# Patient Record
Sex: Female | Born: 1940 | Race: White | Hispanic: No | Smoking: Never smoker
Health system: Southern US, Academic
[De-identification: ages and names within clinical notes are randomized; demographics above are authoritative.]

## PROBLEM LIST (undated history)

## (undated) DIAGNOSIS — S0990XA Unspecified injury of head, initial encounter: Secondary | ICD-10-CM

## (undated) DIAGNOSIS — Z9181 History of falling: Secondary | ICD-10-CM

## (undated) DIAGNOSIS — E079 Disorder of thyroid, unspecified: Secondary | ICD-10-CM

## (undated) DIAGNOSIS — K219 Gastro-esophageal reflux disease without esophagitis: Secondary | ICD-10-CM

## (undated) DIAGNOSIS — I639 Cerebral infarction, unspecified: Secondary | ICD-10-CM

## (undated) DIAGNOSIS — R519 Headache, unspecified: Secondary | ICD-10-CM

## (undated) DIAGNOSIS — E039 Hypothyroidism, unspecified: Secondary | ICD-10-CM

## (undated) DIAGNOSIS — C801 Malignant (primary) neoplasm, unspecified: Secondary | ICD-10-CM

## (undated) DIAGNOSIS — M6530 Trigger finger, unspecified finger: Secondary | ICD-10-CM

## (undated) DIAGNOSIS — R51 Headache: Secondary | ICD-10-CM

## (undated) DIAGNOSIS — L719 Rosacea, unspecified: Secondary | ICD-10-CM

## (undated) DIAGNOSIS — M1990 Unspecified osteoarthritis, unspecified site: Secondary | ICD-10-CM

## (undated) DIAGNOSIS — G43909 Migraine, unspecified, not intractable, without status migrainosus: Secondary | ICD-10-CM

## (undated) HISTORY — PX: HX HYSTERECTOMY: SHX81

## (undated) HISTORY — PX: COLON SURGERY: SHX602

## (undated) HISTORY — PX: BRAIN SURGERY: SHX531

## (undated) HISTORY — PX: OTHER SURGICAL HISTORY: SHX169

## (undated) HISTORY — PX: APPENDECTOMY: SHX54

## (undated) HISTORY — PX: ABDOMINAL HYSTERECTOMY: SHX81

## (undated) HISTORY — PX: FRACTURE SURGERY: SHX138

---
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
## 1960-06-12 HISTORY — PX: HX TUBAL LIGATION: SHX77

## 1970-06-12 HISTORY — PX: HX APPENDECTOMY: SHX54

## 1998-05-03 ENCOUNTER — Ambulatory Visit (INDEPENDENT_AMBULATORY_CARE_PROVIDER_SITE_OTHER): Payer: Self-pay

## 1998-06-29 ENCOUNTER — Ambulatory Visit (INDEPENDENT_AMBULATORY_CARE_PROVIDER_SITE_OTHER): Payer: Self-pay

## 1998-06-30 ENCOUNTER — Ambulatory Visit (INDEPENDENT_AMBULATORY_CARE_PROVIDER_SITE_OTHER): Payer: Self-pay

## 1998-07-14 ENCOUNTER — Ambulatory Visit (HOSPITAL_BASED_OUTPATIENT_CLINIC_OR_DEPARTMENT_OTHER): Payer: Self-pay

## 1998-08-04 ENCOUNTER — Ambulatory Visit (INDEPENDENT_AMBULATORY_CARE_PROVIDER_SITE_OTHER): Payer: Self-pay

## 1998-08-27 ENCOUNTER — Ambulatory Visit (INDEPENDENT_AMBULATORY_CARE_PROVIDER_SITE_OTHER): Payer: Self-pay

## 1998-09-28 ENCOUNTER — Ambulatory Visit (INDEPENDENT_AMBULATORY_CARE_PROVIDER_SITE_OTHER): Payer: Self-pay

## 1998-11-29 ENCOUNTER — Emergency Department (HOSPITAL_COMMUNITY): Payer: Self-pay | Admitting: EMERGENCY MEDICINE

## 1998-11-29 ENCOUNTER — Ambulatory Visit (INDEPENDENT_AMBULATORY_CARE_PROVIDER_SITE_OTHER): Payer: Self-pay

## 1999-02-23 ENCOUNTER — Ambulatory Visit (INDEPENDENT_AMBULATORY_CARE_PROVIDER_SITE_OTHER): Payer: Self-pay

## 1999-05-11 ENCOUNTER — Ambulatory Visit (INDEPENDENT_AMBULATORY_CARE_PROVIDER_SITE_OTHER): Payer: Self-pay

## 1999-05-17 ENCOUNTER — Ambulatory Visit (INDEPENDENT_AMBULATORY_CARE_PROVIDER_SITE_OTHER): Payer: Self-pay | Admitting: Internal Medicine

## 1999-05-20 ENCOUNTER — Ambulatory Visit (INDEPENDENT_AMBULATORY_CARE_PROVIDER_SITE_OTHER): Payer: Self-pay

## 1999-05-27 ENCOUNTER — Ambulatory Visit (INDEPENDENT_AMBULATORY_CARE_PROVIDER_SITE_OTHER): Payer: Self-pay | Admitting: Internal Medicine

## 1999-07-22 ENCOUNTER — Ambulatory Visit (INDEPENDENT_AMBULATORY_CARE_PROVIDER_SITE_OTHER): Payer: Self-pay | Admitting: Internal Medicine

## 1999-07-25 ENCOUNTER — Ambulatory Visit (INDEPENDENT_AMBULATORY_CARE_PROVIDER_SITE_OTHER): Payer: Self-pay

## 1999-12-27 ENCOUNTER — Ambulatory Visit (INDEPENDENT_AMBULATORY_CARE_PROVIDER_SITE_OTHER): Payer: Self-pay

## 2000-06-12 HISTORY — PX: HX OTHER: 2100001105

## 2001-01-01 ENCOUNTER — Emergency Department (HOSPITAL_COMMUNITY): Payer: Self-pay | Admitting: Emergency Medicine

## 2001-01-01 ENCOUNTER — Other Ambulatory Visit: Payer: Self-pay | Admitting: Emergency Medicine

## 2001-01-23 ENCOUNTER — Ambulatory Visit (HOSPITAL_COMMUNITY): Payer: Self-pay

## 2001-01-24 ENCOUNTER — Ambulatory Visit (INDEPENDENT_AMBULATORY_CARE_PROVIDER_SITE_OTHER): Payer: Self-pay

## 2001-02-06 ENCOUNTER — Ambulatory Visit (INDEPENDENT_AMBULATORY_CARE_PROVIDER_SITE_OTHER): Payer: Self-pay | Admitting: Neurology

## 2001-02-06 ENCOUNTER — Other Ambulatory Visit: Payer: Self-pay | Admitting: Neurology

## 2001-02-06 DIAGNOSIS — R569 Unspecified convulsions: Secondary | ICD-10-CM

## 2001-02-06 HISTORY — DX: Unspecified convulsions (CMS HCC): R56.9

## 2001-02-07 ENCOUNTER — Ambulatory Visit (HOSPITAL_COMMUNITY): Payer: Self-pay

## 2001-02-12 ENCOUNTER — Ambulatory Visit (INDEPENDENT_AMBULATORY_CARE_PROVIDER_SITE_OTHER): Payer: Self-pay

## 2001-02-19 ENCOUNTER — Ambulatory Visit (INDEPENDENT_AMBULATORY_CARE_PROVIDER_SITE_OTHER): Payer: Self-pay | Admitting: Plastic Surgery

## 2001-02-26 ENCOUNTER — Ambulatory Visit (HOSPITAL_COMMUNITY): Payer: Self-pay | Admitting: Neuroradiology

## 2001-03-22 ENCOUNTER — Other Ambulatory Visit: Payer: Self-pay | Admitting: Plastic Surgery

## 2001-03-22 ENCOUNTER — Ambulatory Visit (INDEPENDENT_AMBULATORY_CARE_PROVIDER_SITE_OTHER): Payer: Self-pay | Admitting: Plastic Surgery

## 2001-04-18 ENCOUNTER — Ambulatory Visit (INDEPENDENT_AMBULATORY_CARE_PROVIDER_SITE_OTHER): Payer: Self-pay

## 2001-05-07 ENCOUNTER — Ambulatory Visit (HOSPITAL_COMMUNITY): Payer: Self-pay | Admitting: Plastic Surgery

## 2001-05-13 ENCOUNTER — Ambulatory Visit (HOSPITAL_COMMUNITY): Payer: Self-pay | Admitting: Plastic Surgery

## 2001-05-14 ENCOUNTER — Ambulatory Visit (INDEPENDENT_AMBULATORY_CARE_PROVIDER_SITE_OTHER): Payer: Self-pay | Admitting: Plastic Surgery

## 2001-05-21 ENCOUNTER — Ambulatory Visit (INDEPENDENT_AMBULATORY_CARE_PROVIDER_SITE_OTHER): Payer: Self-pay | Admitting: Plastic Surgery

## 2001-06-12 HISTORY — PX: HX COLONOSCOPY: 2100001147

## 2001-06-25 ENCOUNTER — Ambulatory Visit (INDEPENDENT_AMBULATORY_CARE_PROVIDER_SITE_OTHER): Payer: Self-pay | Admitting: Plastic Surgery

## 2001-08-02 ENCOUNTER — Ambulatory Visit (INDEPENDENT_AMBULATORY_CARE_PROVIDER_SITE_OTHER): Payer: Self-pay | Admitting: Plastic Surgery

## 2001-08-30 ENCOUNTER — Ambulatory Visit (INDEPENDENT_AMBULATORY_CARE_PROVIDER_SITE_OTHER): Payer: Self-pay | Admitting: Plastic Surgery

## 2001-09-20 ENCOUNTER — Ambulatory Visit (HOSPITAL_COMMUNITY): Payer: Self-pay

## 2001-09-27 ENCOUNTER — Ambulatory Visit (INDEPENDENT_AMBULATORY_CARE_PROVIDER_SITE_OTHER): Payer: Self-pay | Admitting: Plastic Surgery

## 2001-11-26 ENCOUNTER — Other Ambulatory Visit: Payer: Self-pay

## 2001-11-26 ENCOUNTER — Ambulatory Visit (HOSPITAL_BASED_OUTPATIENT_CLINIC_OR_DEPARTMENT_OTHER): Payer: Self-pay

## 2002-02-04 ENCOUNTER — Ambulatory Visit (INDEPENDENT_AMBULATORY_CARE_PROVIDER_SITE_OTHER): Payer: Self-pay | Admitting: DERMATOLOGY

## 2002-02-17 ENCOUNTER — Ambulatory Visit (INDEPENDENT_AMBULATORY_CARE_PROVIDER_SITE_OTHER): Payer: Self-pay

## 2002-12-12 ENCOUNTER — Ambulatory Visit (HOSPITAL_BASED_OUTPATIENT_CLINIC_OR_DEPARTMENT_OTHER): Payer: Self-pay

## 2003-08-26 ENCOUNTER — Ambulatory Visit (INDEPENDENT_AMBULATORY_CARE_PROVIDER_SITE_OTHER): Payer: Self-pay | Admitting: DERMATOLOGY

## 2003-09-07 ENCOUNTER — Ambulatory Visit (INDEPENDENT_AMBULATORY_CARE_PROVIDER_SITE_OTHER): Payer: Self-pay

## 2004-01-01 ENCOUNTER — Ambulatory Visit (HOSPITAL_BASED_OUTPATIENT_CLINIC_OR_DEPARTMENT_OTHER): Payer: Self-pay

## 2004-01-06 ENCOUNTER — Ambulatory Visit (HOSPITAL_BASED_OUTPATIENT_CLINIC_OR_DEPARTMENT_OTHER): Payer: Self-pay

## 2004-04-29 ENCOUNTER — Ambulatory Visit (HOSPITAL_BASED_OUTPATIENT_CLINIC_OR_DEPARTMENT_OTHER): Payer: Self-pay

## 2004-05-24 ENCOUNTER — Ambulatory Visit (HOSPITAL_COMMUNITY): Payer: Self-pay | Admitting: EXTERNAL

## 2004-08-03 ENCOUNTER — Ambulatory Visit (INDEPENDENT_AMBULATORY_CARE_PROVIDER_SITE_OTHER): Payer: Self-pay

## 2004-10-25 ENCOUNTER — Ambulatory Visit (HOSPITAL_BASED_OUTPATIENT_CLINIC_OR_DEPARTMENT_OTHER): Payer: Self-pay

## 2005-08-29 ENCOUNTER — Ambulatory Visit (INDEPENDENT_AMBULATORY_CARE_PROVIDER_SITE_OTHER): Payer: Self-pay | Admitting: DERMATOLOGY

## 2005-11-23 ENCOUNTER — Ambulatory Visit (INDEPENDENT_AMBULATORY_CARE_PROVIDER_SITE_OTHER): Payer: Self-pay | Admitting: DERMATOLOGY

## 2006-01-09 ENCOUNTER — Ambulatory Visit (HOSPITAL_BASED_OUTPATIENT_CLINIC_OR_DEPARTMENT_OTHER): Payer: Self-pay | Admitting: Family Medicine

## 2006-02-06 ENCOUNTER — Ambulatory Visit (HOSPITAL_BASED_OUTPATIENT_CLINIC_OR_DEPARTMENT_OTHER): Payer: Self-pay | Admitting: Family Medicine

## 2006-02-20 ENCOUNTER — Ambulatory Visit (HOSPITAL_BASED_OUTPATIENT_CLINIC_OR_DEPARTMENT_OTHER): Payer: Self-pay | Admitting: Family Medicine

## 2006-06-22 ENCOUNTER — Ambulatory Visit (HOSPITAL_BASED_OUTPATIENT_CLINIC_OR_DEPARTMENT_OTHER): Payer: Self-pay | Admitting: Family Medicine

## 2006-07-03 ENCOUNTER — Ambulatory Visit (HOSPITAL_BASED_OUTPATIENT_CLINIC_OR_DEPARTMENT_OTHER): Payer: Self-pay

## 2006-07-30 ENCOUNTER — Ambulatory Visit (HOSPITAL_COMMUNITY): Payer: Self-pay

## 2006-08-13 ENCOUNTER — Encounter (HOSPITAL_COMMUNITY): Payer: No Typology Code available for payment source | Admitting: OBSTETRICS/GYNECOLOGY

## 2006-08-13 ENCOUNTER — Inpatient Hospital Stay
Admission: RE | Admit: 2006-08-13 | Discharge: 2006-08-13 | Disposition: A | Discharge status: Home / Self Care | Payer: No Typology Code available for payment source | Attending: OBSTETRICS/GYNECOLOGY | Admitting: OBSTETRICS/GYNECOLOGY

## 2006-08-30 ENCOUNTER — Ambulatory Visit (INDEPENDENT_AMBULATORY_CARE_PROVIDER_SITE_OTHER): Payer: No Typology Code available for payment source | Admitting: OBSTETRICS/GYNECOLOGY

## 2006-09-06 ENCOUNTER — Ambulatory Visit (INDEPENDENT_AMBULATORY_CARE_PROVIDER_SITE_OTHER): Payer: No Typology Code available for payment source | Admitting: OBSTETRICS/GYNECOLOGY

## 2006-09-27 ENCOUNTER — Other Ambulatory Visit (INDEPENDENT_AMBULATORY_CARE_PROVIDER_SITE_OTHER): Payer: No Typology Code available for payment source

## 2006-10-01 ENCOUNTER — Ambulatory Visit (HOSPITAL_BASED_OUTPATIENT_CLINIC_OR_DEPARTMENT_OTHER): Payer: No Typology Code available for payment source | Admitting: OBSTETRICS/GYNECOLOGY

## 2007-01-15 ENCOUNTER — Ambulatory Visit: Payer: No Typology Code available for payment source | Attending: Family Medicine | Admitting: Family Medicine

## 2007-01-15 DIAGNOSIS — R21 Rash and other nonspecific skin eruption: Secondary | ICD-10-CM | POA: Insufficient documentation

## 2007-01-17 ENCOUNTER — Encounter (INDEPENDENT_AMBULATORY_CARE_PROVIDER_SITE_OTHER): Payer: No Typology Code available for payment source | Admitting: DERMATOLOGY

## 2007-06-13 HISTORY — PX: HX COLPOSCOPY: SHX161

## 2007-09-05 ENCOUNTER — Encounter (INDEPENDENT_AMBULATORY_CARE_PROVIDER_SITE_OTHER): Payer: No Typology Code available for payment source | Admitting: DERMATOLOGY

## 2007-09-12 ENCOUNTER — Other Ambulatory Visit (HOSPITAL_BASED_OUTPATIENT_CLINIC_OR_DEPARTMENT_OTHER): Payer: Self-pay | Admitting: Family Medicine

## 2007-09-12 ENCOUNTER — Ambulatory Visit: Payer: No Typology Code available for payment source | Attending: Family Medicine | Admitting: Family Medicine

## 2007-09-12 DIAGNOSIS — Z01419 Encounter for gynecological examination (general) (routine) without abnormal findings: Secondary | ICD-10-CM | POA: Insufficient documentation

## 2007-09-12 MED ORDER — VENLAFAXINE 37.5 MG TABLET
37.50 mg | ORAL_TABLET | Freq: Two times a day (BID) | ORAL | Status: DC
Start: 2007-09-12 — End: 2008-07-03
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
## 2007-09-18 LAB — HISTORICAL CYTOPATHOLOGY-GYN (PAP AND HPV TESTS)

## 2007-10-15 ENCOUNTER — Encounter (HOSPITAL_BASED_OUTPATIENT_CLINIC_OR_DEPARTMENT_OTHER): Payer: No Typology Code available for payment source | Admitting: Family Medicine

## 2007-12-16 ENCOUNTER — Other Ambulatory Visit (HOSPITAL_BASED_OUTPATIENT_CLINIC_OR_DEPARTMENT_OTHER): Payer: Self-pay | Admitting: Family Medicine

## 2007-12-16 MED ORDER — DOXYCYCLINE MONOHYDRATE 100 MG CAPSULE
100.00 mg | ORAL_CAPSULE | Freq: Two times a day (BID) | ORAL | Status: DC
Start: 2007-12-16 — End: 2008-10-29

## 2007-12-16 NOTE — Telephone Encounter (Signed)
Wants refill on doxycyline sent to [facility]

## 2008-04-01 ENCOUNTER — Ambulatory Visit (INDEPENDENT_AMBULATORY_CARE_PROVIDER_SITE_OTHER): Payer: No Typology Code available for payment source | Admitting: Dermatology

## 2008-04-01 NOTE — Progress Notes (Addendum)
Subjective:       Patient ID: Beverly Blair is a 67 y.o. female     Chief Complaint:   Chief Complaint   Patient presents with   . Skin Check          HPI  Pt with h/o AK's & long h/o tanning bed use here for concerns of new lesion on the cheek.  It has been present for about 6 months & is slowly enlarging and becoming redder.  It is sensitive.  She has not used the tanning bed for 1.5 yrs.   She does not use sunscreen when outdoors.  Denies rash or pruritis & lesions that bleed or won't heal.    Review of Systems   Constitutional: Negative.    Skin: Negative.    HENT: Negative.      Current outpatient prescriptions   Medication Sig   . Doxycycline Monohydrate (MONODOX) 100 mg Cap take 1 Cap by mouth Twice daily.    . Venlafaxine (EFFEXOR) 37.5 mg Tab take 1 Tab by mouth Twice daily.        Objective:   .There were no vitals taken for this visit.    Physical Exam   Constitutional: She appears well-developed and well-nourished. No distress.   HENT:   Head: Normocephalic and atraumatic.       Eyes: Conjunctivae are normal.   Skin:        Nursing note and vitals reviewed.       General skin exam was performed and revealed no areas of concern other than those documented.      Assessment & Plan:       1. 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 SK's (#2, 4):  Benign, follow    3. DF:  Benign, folow (#1, body)    4.Skin tags (#3)  Benign, follow.      Doreatha Massed, MD     See resident's note for details. I saw and evaluated the patient and agree with the resident's findings and plan as written except as noted  and: I was present and supervised/observed the entire cryo procedure.    Beverly Sessions, MD

## 2008-04-01 NOTE — Procedures (Signed)
1 lesion treated, see progress note

## 2008-07-03 ENCOUNTER — Encounter (HOSPITAL_BASED_OUTPATIENT_CLINIC_OR_DEPARTMENT_OTHER): Payer: Self-pay | Admitting: Family Medicine

## 2008-07-03 ENCOUNTER — Ambulatory Visit
Admission: RE | Admit: 2008-07-03 | Discharge: 2008-07-03 | Disposition: A | Discharge status: Home / Self Care | Payer: No Typology Code available for payment source | Attending: Family Medicine | Admitting: Family Medicine

## 2008-07-03 ENCOUNTER — Ambulatory Visit (HOSPITAL_BASED_OUTPATIENT_CLINIC_OR_DEPARTMENT_OTHER): Payer: No Typology Code available for payment source | Admitting: Family Medicine

## 2008-07-03 VITALS — BP 122/70 | HR 72 | Temp 98.0°F | Resp 14 | Wt 157.0 lb

## 2008-07-03 DIAGNOSIS — R21 Rash and other nonspecific skin eruption: Secondary | ICD-10-CM | POA: Insufficient documentation

## 2008-07-03 MED ORDER — CEPHALEXIN 500 MG CAPSULE
500.00 mg | ORAL_CAPSULE | Freq: Two times a day (BID) | ORAL | Status: DC
Start: 2008-07-03 — End: 2008-10-29

## 2008-07-03 NOTE — Progress Notes (Signed)
Note dictated..  Ammie Warrick, MD

## 2008-07-03 NOTE — Progress Notes (Signed)
 [place]  [place]    [facility]      PATIENT NAME: Beverly Blair, Beverly Blair   MEDICAL RECORD NUMBER: [id number]  DATE OF BIRTH: Jun 20, 1940     DATE OF SERVICE: 07/03/2008    SUBJECTIVE: The patient comes in with a concern that she may have gotten a spider bite on her left buttock several days ago. She noticed 2 little red marks which enlarged and became crusted followed by several other pustules that appeared. The crusted areas are very tender to palpation. Of note, about a year ago she had a similar outbreak.    OBJECTIVE: Weight 157, temperature 98 degrees Fahrenheit, blood pressure 122/70, pulse 72, respiratory rate 14. On left buttock, 2 areas that are healed over with surrounding 4 small pustules.    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 We will culture for herpes and treat her with Keflex . If it does come back as positive herpes, then we will know to treat her with Valtrex from now on.      Othel Journey, MD  [facility]    HZ/zgy/[id number]; D: 07/03/2008 10:11:40; T: 07/03/2008 11:00:54

## 2008-07-09 LAB — HERPES SIMPLEX CULTURE

## 2008-07-10 ENCOUNTER — Telehealth (HOSPITAL_BASED_OUTPATIENT_CLINIC_OR_DEPARTMENT_OTHER): Payer: Self-pay

## 2008-07-10 MED ORDER — ACYCLOVIR 400 MG TABLET
400.00 mg | ORAL_TABLET | Freq: Three times a day (TID) | ORAL | Status: DC
Start: 2008-07-10 — End: 2009-02-03

## 2008-07-10 NOTE — Telephone Encounter (Signed)
Informed pt of positive herpes culture will change to acyclovir.

## 2008-10-01 ENCOUNTER — Encounter (INDEPENDENT_AMBULATORY_CARE_PROVIDER_SITE_OTHER): Payer: No Typology Code available for payment source | Admitting: DERMATOLOGY

## 2008-10-06 ENCOUNTER — Encounter (HOSPITAL_BASED_OUTPATIENT_CLINIC_OR_DEPARTMENT_OTHER): Payer: No Typology Code available for payment source | Admitting: Family Medicine

## 2008-10-06 ENCOUNTER — Ambulatory Visit (HOSPITAL_BASED_OUTPATIENT_CLINIC_OR_DEPARTMENT_OTHER): Payer: Self-pay

## 2008-10-07 ENCOUNTER — Ambulatory Visit
Admission: RE | Admit: 2008-10-07 | Discharge: 2008-10-07 | Disposition: A | Discharge status: Home / Self Care | Payer: No Typology Code available for payment source | Attending: Family Medicine | Admitting: Family Medicine

## 2008-10-09 ENCOUNTER — Encounter (HOSPITAL_BASED_OUTPATIENT_CLINIC_OR_DEPARTMENT_OTHER): Payer: No Typology Code available for payment source | Admitting: Family Medicine

## 2008-10-29 ENCOUNTER — Ambulatory Visit (INDEPENDENT_AMBULATORY_CARE_PROVIDER_SITE_OTHER): Payer: No Typology Code available for payment source | Admitting: DERMATOLOGY

## 2008-10-29 ENCOUNTER — Encounter (INDEPENDENT_AMBULATORY_CARE_PROVIDER_SITE_OTHER): Payer: Self-pay | Admitting: DERMATOLOGY

## 2008-10-29 MED ORDER — TRETINOIN 0.025 % TOPICAL CREAM
TOPICAL_CREAM | Freq: Every evening | CUTANEOUS | Status: DC
Start: 2008-10-29 — End: 2009-02-03

## 2008-10-29 NOTE — Progress Notes (Signed)
Subjective:       Patient ID: Beverly Blair is a 68 y.o. female     Chief Complaint:   Chief Complaint   Patient presents with   . Skin Check     6-7 month checkup. AK's, sk's, DF, skin tags.          HPI  AK recurring right cheek.  Tags on neck catching on clothing.  Changing moles right neck.  No hx skin cancer.  No itchy rash.  No fever or chills.    Current outpatient prescriptions   Medication Sig   . acyclovir (ZOVIRAX) 400 mg Tab take 1 Tab by mouth Three times a day.    Marland Kitchen DISCONTD: cephALEXin (KEFLEX) 500 mg Cap take 1 Cap by mouth Twice daily.    Marland Kitchen DISCONTD: Doxycycline Monohydrate (MONODOX) 100 mg Cap take 1 Cap by mouth Twice daily.          Review of Systems   Constitutional: Negative for fever and chills.   Skin: Negative for rash and itching.   Cardiovascular: Negative for chest pain.   Respiratory: Negative for cough.  Is not experiencing shortness of breath.   Gastrointestinal: Negative for vomiting and diarrhea.       Objective:   .BP 122/76   Wt 70.398 kg (155 lb 3.2 oz)    Physical Exam   Constitutional: She is oriented. She appears well-developed and well-nourished. No distress.   HENT:   Head: Normocephalic and atraumatic.   Neurological: She is alert and oriented.   Skin: Skin is warm and dry. She is not diaphoretic.        Psychiatric: She has a normal mood and affect.     General skin exam was performed including head, neck, anterior/posterior trunk, bilateral upper & lower extremities and revealed no areas of concern other than those documented.    Assessment & Plan:     Actinic keratosis right cheek  Skin tags  Irritated seborrheic right neck    Plan;  Freeze thaw freeze AK  Snip tags    Sun screen   Tretinoin    Beverly Sessions, MD

## 2008-11-03 ENCOUNTER — Encounter (HOSPITAL_BASED_OUTPATIENT_CLINIC_OR_DEPARTMENT_OTHER): Payer: No Typology Code available for payment source | Admitting: Family Medicine

## 2008-11-24 ENCOUNTER — Ambulatory Visit (HOSPITAL_BASED_OUTPATIENT_CLINIC_OR_DEPARTMENT_OTHER): Payer: No Typology Code available for payment source | Admitting: Family Medicine

## 2008-11-24 ENCOUNTER — Encounter (HOSPITAL_BASED_OUTPATIENT_CLINIC_OR_DEPARTMENT_OTHER): Payer: Self-pay | Admitting: Family Medicine

## 2008-11-24 ENCOUNTER — Other Ambulatory Visit (HOSPITAL_BASED_OUTPATIENT_CLINIC_OR_DEPARTMENT_OTHER): Payer: Self-pay | Admitting: Family Medicine

## 2008-11-24 ENCOUNTER — Ambulatory Visit
Admission: RE | Admit: 2008-11-24 | Discharge: 2008-11-24 | Disposition: A | Discharge status: Home / Self Care | Payer: No Typology Code available for payment source | Attending: Family Medicine | Admitting: Family Medicine

## 2008-11-24 VITALS — BP 122/72 | Temp 97.1°F | Ht 63.0 in | Wt 153.0 lb

## 2008-11-24 DIAGNOSIS — Z01419 Encounter for gynecological examination (general) (routine) without abnormal findings: Secondary | ICD-10-CM | POA: Insufficient documentation

## 2008-11-24 DIAGNOSIS — Z1151 Encounter for screening for human papillomavirus (HPV): Secondary | ICD-10-CM | POA: Insufficient documentation

## 2008-11-24 DIAGNOSIS — R87612 Low grade squamous intraepithelial lesion on cytologic smear of cervix (LGSIL): Secondary | ICD-10-CM | POA: Insufficient documentation

## 2008-11-24 DIAGNOSIS — Z124 Encounter for screening for malignant neoplasm of cervix: Secondary | ICD-10-CM | POA: Insufficient documentation

## 2008-11-24 MED ORDER — CONJ ESTROGEN-MEDROXYPROGESTERONE 0.625 MG-2.5 MG TABLET
1.0000 | ORAL_TABLET | Freq: Every day | ORAL | Status: DC
Start: 2008-11-24 — End: 2009-03-31

## 2008-11-24 MED ORDER — DOXYCYCLINE MONOHYDRATE 100 MG CAPSULE
100.00 mg | ORAL_CAPSULE | Freq: Two times a day (BID) | ORAL | Status: DC
Start: 2008-11-24 — End: 2009-03-31

## 2008-11-24 NOTE — Progress Notes (Signed)
Note dictated..  Jerrico Covello, MD

## 2008-11-26 LAB — HISTORICAL CYTOPATHOLOGY-GYN (PAP AND HPV TESTS)

## 2008-12-03 ENCOUNTER — Encounter (HOSPITAL_BASED_OUTPATIENT_CLINIC_OR_DEPARTMENT_OTHER): Payer: Self-pay | Admitting: Family Medicine

## 2009-02-03 ENCOUNTER — Ambulatory Visit (INDEPENDENT_AMBULATORY_CARE_PROVIDER_SITE_OTHER): Payer: No Typology Code available for payment source | Admitting: Obstetrics & Gynecology

## 2009-02-03 ENCOUNTER — Encounter (HOSPITAL_BASED_OUTPATIENT_CLINIC_OR_DEPARTMENT_OTHER): Payer: Self-pay | Admitting: Obstetrics & Gynecology

## 2009-02-03 VITALS — BP 116/62 | Ht 64.0 in | Wt 147.0 lb

## 2009-02-03 DIAGNOSIS — R87612 Low grade squamous intraepithelial lesion on cytologic smear of cervix (LGSIL): Secondary | ICD-10-CM

## 2009-02-03 HISTORY — DX: Low grade squamous intraepithelial lesion on cytologic smear of cervix (LGSIL): R87.612

## 2009-02-25 ENCOUNTER — Ambulatory Visit
Admission: RE | Admit: 2009-02-25 | Discharge: 2009-02-25 | Disposition: A | Discharge status: Home / Self Care | Payer: No Typology Code available for payment source | Attending: Family Medicine | Admitting: Family Medicine

## 2009-02-25 DIAGNOSIS — M810 Age-related osteoporosis without current pathological fracture: Secondary | ICD-10-CM | POA: Insufficient documentation

## 2009-02-25 DIAGNOSIS — Z780 Asymptomatic menopausal state: Secondary | ICD-10-CM | POA: Insufficient documentation

## 2009-02-25 DIAGNOSIS — Z13820 Encounter for screening for osteoporosis: Secondary | ICD-10-CM | POA: Insufficient documentation

## 2009-02-26 ENCOUNTER — Encounter (HOSPITAL_BASED_OUTPATIENT_CLINIC_OR_DEPARTMENT_OTHER): Payer: No Typology Code available for payment source | Admitting: Family Medicine

## 2009-02-26 ENCOUNTER — Other Ambulatory Visit (HOSPITAL_BASED_OUTPATIENT_CLINIC_OR_DEPARTMENT_OTHER): Payer: Self-pay

## 2009-03-10 ENCOUNTER — Encounter (HOSPITAL_BASED_OUTPATIENT_CLINIC_OR_DEPARTMENT_OTHER): Payer: Self-pay | Admitting: Obstetrics & Gynecology

## 2009-03-10 ENCOUNTER — Ambulatory Visit
Admission: RE | Admit: 2009-03-10 | Discharge: 2009-03-10 | Disposition: A | Discharge status: Home / Self Care | Payer: No Typology Code available for payment source | Attending: Obstetrics & Gynecology | Admitting: Obstetrics & Gynecology

## 2009-03-10 ENCOUNTER — Ambulatory Visit (INDEPENDENT_AMBULATORY_CARE_PROVIDER_SITE_OTHER): Payer: No Typology Code available for payment source | Admitting: Obstetrics & Gynecology

## 2009-03-10 VITALS — BP 138/90 | Ht 63.5 in | Wt 144.0 lb

## 2009-03-10 NOTE — Progress Notes (Signed)
See procedure notes  Edwyna Perfect, MD 03/10/2009, 2:52 PM

## 2009-03-13 LAB — HISTORICAL SURGICAL PATHOLOGY SPECIMEN

## 2009-03-14 ENCOUNTER — Encounter (HOSPITAL_BASED_OUTPATIENT_CLINIC_OR_DEPARTMENT_OTHER): Payer: Self-pay | Admitting: Obstetrics & Gynecology

## 2009-03-31 ENCOUNTER — Ambulatory Visit (INDEPENDENT_AMBULATORY_CARE_PROVIDER_SITE_OTHER): Payer: No Typology Code available for payment source | Admitting: Obstetrics & Gynecology

## 2009-03-31 ENCOUNTER — Encounter (HOSPITAL_BASED_OUTPATIENT_CLINIC_OR_DEPARTMENT_OTHER): Payer: Self-pay | Admitting: Obstetrics & Gynecology

## 2009-03-31 VITALS — BP 132/76 | Ht 63.5 in | Wt 145.0 lb

## 2009-03-31 MED ORDER — CONJ ESTROGEN-MEDROXYPROGESTERONE 0.625 MG-2.5 MG TABLET
1.0000 | ORAL_TABLET | Freq: Every day | ORAL | Status: DC
Start: 2009-03-31 — End: 2009-11-29

## 2009-03-31 MED ORDER — DOXYCYCLINE MONOHYDRATE 100 MG CAPSULE
100.00 mg | ORAL_CAPSULE | Freq: Two times a day (BID) | ORAL | Status: DC
Start: 2009-03-31 — End: 2009-11-09

## 2009-03-31 NOTE — Progress Notes (Signed)
Patient is here for follow up after colposcopy    68 y.o. female who presented for colposcopy on 03-10-09  Due to a pap smear consistent with LGSIL    Pathology results reveal biopsy results consistent with LGSIL, but completely stenotic cervix and no ECC able to be performed and also inadequate due to inability to visualize the TZ zone.    Patient had no complaints after the procedure.    ROS: The patient denies any bowel or bladder symptoms, all other systems were negative to review.    Physical Examination:    Filed Vitals:    03/31/2009  1:24 PM   BP: 132/76   Height: 1.613 m (5' 3.5")   Weight: 65.772 kg (145 lb)         General: female in no acute distress  HEENT: NCAT, EOMI  Neurologic: Alert and Oriented  Psychological: Mood stable    Assessment/Plan:    1. Follow up after Colposcopy / Mild Dysplasia, but inadequate colposcopy  The results were discussed with the patient. The patients options were discussed in great detail and a decision for a CKC was agreed upon due to inability to rule out further disease. Risks, benefits, and alternatives were discussed.    15/15 min was spent on counseling the above issues    Edwyna Perfect, MD

## 2009-04-01 ENCOUNTER — Ambulatory Visit (FREE_STANDING_LABORATORY_FACILITY): Payer: Self-pay | Admitting: Obstetrics & Gynecology

## 2009-04-01 NOTE — Telephone Encounter (Addendum)
Pt would like to speak with Dr Cecelia Byars. Pt states if there is a possibility that you may have to do a hysterectomy if there are cancerous cells present, she is wondering if she should go ahead with partial hyst and remove the uterus since she is going to be in there anyway. Please advise.  Cell: [phone]    Called pt per Dr Cecelia Byars, pt will not be able to do hysterectomy until bx is done.

## 2009-04-26 ENCOUNTER — Encounter (HOSPITAL_COMMUNITY): Payer: Self-pay

## 2009-04-26 ENCOUNTER — Encounter (HOSPITAL_BASED_OUTPATIENT_CLINIC_OR_DEPARTMENT_OTHER): Payer: No Typology Code available for payment source | Admitting: Obstetrics & Gynecology

## 2009-04-29 ENCOUNTER — Encounter (INDEPENDENT_AMBULATORY_CARE_PROVIDER_SITE_OTHER): Payer: No Typology Code available for payment source | Admitting: DERMATOLOGY

## 2009-04-30 ENCOUNTER — Ambulatory Visit
Admission: RE | Admit: 2009-04-30 | Discharge: 2009-04-30 | Disposition: A | Discharge status: Home / Self Care | Payer: No Typology Code available for payment source | Attending: Obstetrics & Gynecology | Admitting: Obstetrics & Gynecology

## 2009-04-30 ENCOUNTER — Encounter (HOSPITAL_BASED_OUTPATIENT_CLINIC_OR_DEPARTMENT_OTHER): Payer: Self-pay | Admitting: Obstetrics & Gynecology

## 2009-04-30 ENCOUNTER — Ambulatory Visit (INDEPENDENT_AMBULATORY_CARE_PROVIDER_SITE_OTHER): Payer: No Typology Code available for payment source | Admitting: Obstetrics & Gynecology

## 2009-04-30 ENCOUNTER — Encounter (HOSPITAL_COMMUNITY): Payer: Self-pay

## 2009-04-30 DIAGNOSIS — Z01818 Encounter for other preprocedural examination: Secondary | ICD-10-CM | POA: Insufficient documentation

## 2009-04-30 HISTORY — DX: Unspecified osteoarthritis, unspecified site: M19.90

## 2009-04-30 HISTORY — DX: Rosacea, unspecified: L71.9

## 2009-04-30 LAB — URINALYSIS, MACROSCOPIC AND MICROSCOPIC
BILIRUBIN: NEGATIVE
BLOOD: NEGATIVE
GLUCOSE: NEGATIVE mg/dL
HYALINE CAST: 1 /LPF (ref 0–3)
KETONES: NEGATIVE mg/dL
LEUKOCYTES: NEGATIVE
NITRITE: POSITIVE — AB
PH URINE: 5 (ref 5.0–8.0)
PROTEIN: NEGATIVE mg/dL
RBC'S: NONE SEEN /HPF (ref 0–4)
SPECIFIC GRAVITY, URINE: 1.016 (ref 1.005–1.030)
UROBILINOGEN: NORMAL mg/dL
WBC'S: 1 /HPF (ref 0–6)

## 2009-04-30 LAB — ELECTROLYTES
ANION GAP: 6 mmol/L (ref 5–16)
CARBON DIOXIDE: 27 mmol/L (ref 22–32)
CHLORIDE: 106 mmol/L (ref 96–111)
POTASSIUM: 4.2 mmol/L (ref 3.5–5.1)
SODIUM: 139 mmol/L (ref 136–145)

## 2009-04-30 LAB — BUN
BUN/CREAT RATIO: 21 (ref 6–22)
BUN: 14 mg/dL (ref 6–20)

## 2009-04-30 LAB — CBC/DIFF
BASOPHILS: 1 % (ref 0–1)
BASOS ABS: 0.052 THOU/uL (ref 0.0–0.2)
EOS ABS: 0.062 THOU/uL — ABNORMAL LOW (ref 0.1–0.3)
EOSINOPHIL: 1 % (ref 1–6)
HCT: 37.8 % (ref 33.5–45.2)
HGB: 13.3 g/dL (ref 11.5–15.2)
LYMPHOCYTES: 39 % (ref 20–45)
LYMPHS ABS: 2.7 THOU/uL (ref 1.0–4.8)
MCH: 33.2 pg — ABNORMAL HIGH (ref 27.4–33.0)
MCHC: 35.1 g/dL (ref 31.6–35.5)
MCV: 94.5 fL (ref 82.0–99.0)
MONOCYTES: 7 % (ref 4–13)
MONOS ABS: 0.452 THOU/uL (ref 0.1–0.9)
MPV: 8.2 FL (ref 7.4–10.4)
NRBC'S: 0 /100{WBCs}
PLATELET COUNT: 234 THO/UL (ref 140–450)
PMN ABS: 3.59 THOU/uL (ref 1.5–7.7)
PMN'S: 52 % (ref 40–75)
RBC: 4 MIL/uL (ref 3.84–5.04)
RDW: 11.1 % (ref 10.2–14.0)
WBC: 6.9 THOU/UL (ref 3.5–11.0)

## 2009-04-30 LAB — CREATININE WITH EGFR: ESTIMATED GLOMERULAR FILTRATION RATE: >59 ml/min/1.73m2 (ref >59)

## 2009-04-30 LAB — TYPE AND SCREEN
ABO/RH(D): A POS
ANTIBODY SCREEN: NEGATIVE

## 2009-04-30 LAB — CREATININE: CREATININE: 0.68 mg/dL (ref 0.49–1.10)

## 2009-04-30 NOTE — Progress Notes (Signed)
This is a preop visit      This is a  68 y.o. 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  Patient Active Problem List   Diagnoses Date Noted   . 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 Rosacea [695.3] 11/24/2008   . Menopausal and postmenopausal disorder [627.9C] 11/24/2008   . GERD [530.81] 08/13/2006   . Headaches [784.0] 08/13/2006   . Seizures [780.39] 02/06/2001           ROS:  The patient denies and bowel or bladder symptoms. All other symptoms negative to review.      Past Surgical History   Procedure Date   . Hx tubal ligation    . Hx colposcopy 2009   . Hx tonsillectomy    . Hx appendectomy          History   Substance Use Topics   . Tobacco Use: Never   . Alcohol Use: Yes       Family History   Problem Relation   . Cancer Father               Physical Examination:  Filed Vitals:    04/30/2009  8:36 AM   BP: 110/60   Height: 1.613 m (5' 3.5")   Weight: 64.411 kg (142 lb)         General: no acute distress  Skin: intact, without lesions  HEENT: NCAT, EOMI  Lungs: Clear to auscultation  Cardiac: Regular rate and rhythm without murmur, rub, or gallop  Neurologic: alert and oriented, no focal deficits noted  Psychiatric: mood stable      Assessment/Plan:    1. Preoperative Visit  The patient was consented. All risks, benefits, and alternatives were discussed. The patient wishes to proceed. She will present to PAT for lab work. NPO post midnight discussed the night before surgery, no ASA, NSAIDS, or herbal drugs between now and her surgery. Postop course discussed and a postop appt will be made today.   Shmuel Girgis Cecelia Byars, MD                                  GYN PRE OP INFO    Procedure date: 05-11-09  Attending: Myrick Mcnairy  Admit to :[facility]  Resident: Gyn  Cervical dysplasia  Procedure: CKC  No known drug allergies   Medications to take on AM of surgery- am meds

## 2009-05-02 LAB — URINE CULTURE

## 2009-05-03 ENCOUNTER — Other Ambulatory Visit (HOSPITAL_BASED_OUTPATIENT_CLINIC_OR_DEPARTMENT_OTHER): Payer: Self-pay | Admitting: Obstetrics & Gynecology

## 2009-05-03 ENCOUNTER — Telehealth (HOSPITAL_BASED_OUTPATIENT_CLINIC_OR_DEPARTMENT_OTHER): Payer: Self-pay | Admitting: Obstetrics & Gynecology

## 2009-05-03 ENCOUNTER — Ambulatory Visit (HOSPITAL_BASED_OUTPATIENT_CLINIC_OR_DEPARTMENT_OTHER): Payer: Self-pay | Admitting: Family Medicine

## 2009-05-03 MED ORDER — SULFAMETHOXAZOLE 800 MG-TRIMETHOPRIM 160 MG TABLET
1.00 | ORAL_TABLET | Freq: Two times a day (BID) | ORAL | Status: DC
Start: 2009-05-03 — End: 2009-05-28

## 2009-05-03 NOTE — Telephone Encounter (Signed)
Call with ekg results

## 2009-05-03 NOTE — Telephone Encounter (Addendum)
Result Notes message copied by Altariq Goodall, Larae Grooms on Mon May 03, 2009  1:40 PM  ------   Message from: Edwyna Perfect        Created: Mon May 03, 2009  1:06 PM    Call patient with uti and needs to get called in bactrim ds one tab po bid for 7 days and a repeat urine culture prior to her surgery      Phoned in rx as above per Dr Cecelia Byars to [facility]

## 2009-05-04 ENCOUNTER — Encounter (HOSPITAL_BASED_OUTPATIENT_CLINIC_OR_DEPARTMENT_OTHER): Payer: Self-pay | Admitting: Family Medicine

## 2009-05-04 ENCOUNTER — Ambulatory Visit (HOSPITAL_BASED_OUTPATIENT_CLINIC_OR_DEPARTMENT_OTHER): Payer: No Typology Code available for payment source | Admitting: Family Medicine

## 2009-05-04 ENCOUNTER — Ambulatory Visit
Admission: RE | Admit: 2009-05-04 | Discharge: 2009-05-04 | Disposition: A | Discharge status: Home / Self Care | Payer: No Typology Code available for payment source | Attending: Family Medicine | Admitting: Family Medicine

## 2009-05-04 DIAGNOSIS — Z01818 Encounter for other preprocedural examination: Secondary | ICD-10-CM | POA: Insufficient documentation

## 2009-05-04 NOTE — Progress Notes (Signed)
Note dictated..  Keeli Roberg, MD

## 2009-05-09 ENCOUNTER — Ambulatory Visit
Admission: RE | Admit: 2009-05-09 | Discharge: 2009-05-09 | Disposition: A | Discharge status: Home / Self Care | Payer: No Typology Code available for payment source | Attending: Obstetrics & Gynecology | Admitting: Obstetrics & Gynecology

## 2009-05-09 ENCOUNTER — Other Ambulatory Visit (HOSPITAL_BASED_OUTPATIENT_CLINIC_OR_DEPARTMENT_OTHER): Payer: Self-pay | Admitting: Obstetrics & Gynecology

## 2009-05-10 ENCOUNTER — Encounter (HOSPITAL_BASED_OUTPATIENT_CLINIC_OR_DEPARTMENT_OTHER): Payer: No Typology Code available for payment source | Admitting: Obstetrics & Gynecology

## 2009-05-10 NOTE — OR PreOp (Cosign Needed)
Pre-Op Note  Patient Name: Beverly Blair  DOB: [birth year]  GNF:[id number]      Date and Time: May 11, 2009; Start Time: 0715; End Time: 0850    Pre-Op Diagnosis: Cervical dysplasia and inadequate colposcopy    Procedure: Biopsy Cervix Cone    Surgeon: Dr. Edwyna Perfect    Labs:    Results for orders placed during the hospital encounter of 05/09/2009 (from the past 480 hours)   URINE CULTURE WITH GRAM STAIN    Collection Time 05/09/09  1:14 PM   Component Value Range   . SPECIMEN DESCRIPTION URINE     . SPECIAL REQUESTS NONE     . GRAM STAIN NO CELLS OR ORGANISMS SEEN     . CULTURE OBSERVATION NO GROWTH 1 DAY     . REPORT STATUS PENDING     Results for orders placed during the hospital encounter of 04/30/2009 (from the past 480 hours)   BUN    Collection Time 04/30/09 10:12 AM   Component Value Range   . BUN 14  6 - 20 (mg/dL)   . BUN/CREAT RATIO 21  6 - 22    CBC/DIFF    Collection Time 04/30/09 10:12 AM   Component Value Range   . WBC 6.9  3.5 - 11.0 (THOU/UL)   . RBC 4.00  3.84 - 5.04 (MIL/uL)   . HGB 13.3  11.5 - 15.2 (g/dL)   . HCT 37.8  33.5 - 45.2 (%)   . MCV 94.5  82.0 - 99.0 (fL)   . MCH 33.2 (*) 27.4 - 33.0 (pg)   . MCHC 35.1  31.6 - 35.5 (g/dL)   . RDW 11.1  10.2 - 14.0 (%)   . PLATELET COUNT 234  140 - 450 (THO/UL)   . MPV 8.2  7.4 - 10.4 (FL)   . PMN'S 52  40 - 75 (%)   . PMN ABS 3.590  1.5 - 7.7 (THOU/uL)   . LYMPHOCYTES 39  20 - 45 (%)   . LYMPHS ABS 2.700  1.0 - 4.8 (THOU/uL)   . MONOCYTES 7  4 - 13 (%)   . MONOS ABS 0.452  0.1 - 0.9 (THOU/uL)   . EOSINOPHIL 1  1 - 6 (%)   . EOS ABS 0.062 (*) 0.1 - 0.3 (THOU/uL)   . BASOPHILS 1  0 - 1 (%)   . BASOS ABS 0.052  0.0 - 0.2 (THOU/uL)   . NRBC'S 0  0 (/100WBC)   CREATININE    Collection Time 04/30/09 10:12 AM   Component Value Range   . CREATININE 0.68  0.49 - 1.10 (mg/dL)   . ESTIMATED GLOMERULAR FILTRATION RATE >59  >59 (ml/min/1.40m2)   ELECTROLYTES    Collection Time 04/30/09 10:12 AM   Component Value Range   . SODIUM 139  136 - 145 (mmol/L)   .  POTASSIUM 4.2  3.5 - 5.1 (mmol/L)   . CHLORIDE 106  96 - 111 (mmol/L)   . CARBON DIOXIDE 27  22 - 32 (mmol/L)   . ANION GAP 6  5 - 16 (mmol/L)   URINALYSIS (ROUTINE)    Collection Time 04/30/09 10:12 AM   Component Value Range   . CHARACTER CLEAR     . COLOR YELLOW     . SPECIFIC GRAVITY, URINE 1.016  1.005 - 1.030    . GLUCOSE NEGATIVE  NEGATIVE (mg/dL)   . BILIRUBIN NEGATIVE  NEGATIVE    . KETONES NEGATIVE  NEGATIVE (mg/dL)   .  BLOOD NEGATIVE  NEGATIVE    . PH URINE 5.0  5.0 - 8.0    . PROTEIN NEGATIVE  NEGATIVE (mg/dL)   . UROBILINOGEN NORMAL  0.2 (mg/dL)   . NITRITE POSITIVE (*) NEGATIVE    . LEUKOCYTES NEGATIVE  NEGATIVE    . RBC'S    0 - 4 (/HPF)    Value: NONE SEEN- MICROSCOPIC ANALYSIS PERFORMED BY AUTOMATED METHOD   . WBC'S 1  0 - 6 (/HPF)   . BACTERIA FEW (*) OCL    . Squamous Epithelial RARE  (/LPF)   . Mucous LIGHT  (/LPF)   . Hyaline Cast 1  0 - 3 (/LPF)   URINE CULTURE (OUTPT)    Collection Time 04/30/09 10:12 AM   Component Value Range   . SPECIMEN DESCRIPTION CLEAN CATCH URINE     . SPECIAL REQUESTS NONE     . GRAM STAIN NOT REPORTED     . CULTURE OBSERVATION   (*)     Value: GREATER THAN 100,000 COLONIES/ML      KLEBSIELLA PNEUMONIAE   . REPORT STATUS        Value: 05/02/2009      FINAL   TYPE AND SCREEN    Collection Time 04/30/09 10:12 AM   Component Value Range   . UNITS ORDERED NOT STATED         . ABO/RH(D) A POSITIVE     . ANTIBODY SCREEN NEGATIVE     . SPECIMEN EXPIRATION DATE 05/03/2009       Chest X-ray: NONE FOUND    EKG::  Ventricular Rate 65 BPM  Atrial Rate 65 BPM  P-R Interval 158 ms  QRS Duration 90 ms  QT 400 ms  QTc 416 ms  P Axis 46 degrees  R Axis -30 degrees  T Axis 31 degrees  Sinus rhythm  Left axis deviation  Confirmed by Raphael Gibney MD, ABNASH ([id number]), editor VENICK, SHAYNA ([id number]) on 05/04/2009 6:55:24 PM     Blood: A POSITIVE; ANTIBODY SCREEN NEGATIVE    Anesthesia: General    Consent: Signed 04/30/09    Orders:  1. Insert and Maintain Peripheral IV Access  2. Diet NPO  3. Vital Signs:  Per [facility]SC Routine  4. Height and Weight  5. Place Sequential Compression Device - Place in OR  6. Surgical Site Prep  7. Type and Screen  8. LR 1000 mL premix infusion    Macario Carls, MED STUDENT 05/10/2009, 9:12 PM

## 2009-05-11 ENCOUNTER — Encounter (HOSPITAL_COMMUNITY)
Admission: RE | Disposition: A | Discharge status: Home / Self Care | Payer: Self-pay | Source: Ambulatory Visit | Attending: Obstetrics & Gynecology

## 2009-05-11 ENCOUNTER — Encounter (HOSPITAL_BASED_OUTPATIENT_CLINIC_OR_DEPARTMENT_OTHER): Payer: No Typology Code available for payment source | Admitting: Obstetrics & Gynecology

## 2009-05-11 ENCOUNTER — Other Ambulatory Visit (HOSPITAL_BASED_OUTPATIENT_CLINIC_OR_DEPARTMENT_OTHER): Payer: Self-pay | Admitting: Obstetrics & Gynecology

## 2009-05-11 ENCOUNTER — Other Ambulatory Visit (HOSPITAL_COMMUNITY): Payer: Self-pay | Admitting: Obstetrics & Gynecology

## 2009-05-11 ENCOUNTER — Inpatient Hospital Stay
Admission: RE | Admit: 2009-05-11 | Discharge: 2009-05-11 | Disposition: A | Discharge status: Home / Self Care | Payer: No Typology Code available for payment source | Attending: Obstetrics & Gynecology | Admitting: Obstetrics & Gynecology

## 2009-05-11 DIAGNOSIS — N879 Dysplasia of cervix uteri, unspecified: Secondary | ICD-10-CM | POA: Insufficient documentation

## 2009-05-11 DIAGNOSIS — N882 Stricture and stenosis of cervix uteri: Secondary | ICD-10-CM | POA: Insufficient documentation

## 2009-05-11 HISTORY — PX: HCHG CONE BIOPSY: 700072

## 2009-05-11 LAB — TYPE AND SCREEN
ABO/RH(D): A POS
ANTIBODY SCREEN: NEGATIVE

## 2009-05-11 LAB — URINE CULTURE WITH GRAM STAIN
CULTURE OBSERVATION: NO GROWTH
GRAM STAIN: NONE SEEN

## 2009-05-11 SURGERY — BIOPSY CERVIX CONE
Anesthesia: General

## 2009-05-11 MED ORDER — MIDAZOLAM 1 MG/ML INJECTION SOLUTION
INTRAMUSCULAR | Status: AC
Start: 2009-05-11 — End: 2009-05-11
  Filled 2009-05-11: qty 2

## 2009-05-11 MED ORDER — ROCURONIUM 10 MG/ML INTRAVENOUS SOLUTION
INTRAVENOUS | Status: AC
Start: 2009-05-11 — End: 2009-05-11
  Filled 2009-05-11: qty 5

## 2009-05-11 MED ORDER — SODIUM CHLORIDE 0.9 % IRRIGATION SOLUTION
1000.00 mL | Status: DC | PRN
Start: 2009-05-11 — End: 2009-05-11
  Administered 2009-05-11: 1000 mL

## 2009-05-11 MED ORDER — ONDANSETRON HCL (PF) 4 MG/2 ML INJECTION SOLUTION
4.00 mg | Freq: Once | INTRAMUSCULAR | Status: DC | PRN
Start: 2009-05-11 — End: 2009-05-11

## 2009-05-11 MED ORDER — IODINE STRONG (LUGOLS) 5 % ORAL SOLN
5.00 mL | Freq: Once | ORAL | Status: DC | PRN
Start: 2009-05-11 — End: 2009-05-11
  Administered 2009-05-11: 5 mL via TOPICAL
  Filled 2009-05-11: qty 20

## 2009-05-11 MED ORDER — EPHEDRINE SULFATE 50 MG/ML INJECTION SOLUTION
INTRAMUSCULAR | Status: AC
Start: 2009-05-11 — End: 2009-05-11
  Filled 2009-05-11: qty 1

## 2009-05-11 MED ORDER — IBUPROFEN 600 MG TABLET
600.00 mg | ORAL_TABLET | Freq: Once | ORAL | Status: DC | PRN
Start: 2009-05-11 — End: 2009-05-11

## 2009-05-11 MED ORDER — LACTATED RINGERS INTRAVENOUS SOLUTION
INTRAVENOUS | Status: DC
Start: 2009-05-11 — End: 2009-05-11
  Administered 2009-05-11: 0 via INTRAVENOUS

## 2009-05-11 MED ORDER — GLYCOPYRROLATE 0.2 MG/ML INJECTION SOLUTION
INTRAMUSCULAR | Status: AC
Start: 2009-05-11 — End: 2009-05-11
  Filled 2009-05-11: qty 1

## 2009-05-11 MED ORDER — FENTANYL (PF) 50 MCG/ML INJECTION SOLUTION
INTRAMUSCULAR | Status: AC
Start: 2009-05-11 — End: 2009-05-11
  Filled 2009-05-11: qty 2

## 2009-05-11 MED ORDER — ATROPINE 0.4 MG/ML INJECTION SOLUTION
INTRAMUSCULAR | Status: AC
Start: 2009-05-11 — End: 2009-05-11
  Filled 2009-05-11: qty 1

## 2009-05-11 MED ORDER — SURGICAL FIBRILLAR ABS HEMOSTAT OXIDIZED CELLULOSE 2X4 PAD
1.00 | MEDICATED_PAD | Freq: Once | TOPICAL | Status: AC
Start: 2009-05-11 — End: 2009-05-11
  Administered 2009-05-11: 1 via TOPICAL

## 2009-05-11 MED ORDER — ONDANSETRON HCL (PF) 4 MG/2 ML INJECTION SOLUTION
INTRAMUSCULAR | Status: AC
Start: 2009-05-11 — End: 2009-05-11
  Filled 2009-05-11: qty 2

## 2009-05-11 MED ORDER — IBUPROFEN 600 MG TABLET
600.00 mg | ORAL_TABLET | Freq: Four times a day (QID) | ORAL | Status: DC | PRN
Start: 2009-05-11 — End: 2009-05-28

## 2009-05-11 MED ORDER — WATER FOR INJECTION, STERILE INJECTION SOLUTION
INTRAMUSCULAR | Status: AC
Start: 2009-05-11 — End: 2009-05-11
  Filled 2009-05-11: qty 10

## 2009-05-11 MED ORDER — LIDOCAINE (PF) 100 MG/5 ML (2 %) INTRAVENOUS SYRINGE
INJECTION | INTRAVENOUS | Status: AC
Start: 2009-05-11 — End: 2009-05-11
  Filled 2009-05-11: qty 5

## 2009-05-11 MED ORDER — OXYCODONE-ACETAMINOPHEN 5 MG-325 MG TABLET
1.00 | ORAL_TABLET | ORAL | Status: DC | PRN
Start: 2009-05-11 — End: 2009-05-11

## 2009-05-11 MED ORDER — DEXAMETHASONE SODIUM PHOSPHATE 10 MG/ML INJECTION SOLUTION
INTRAMUSCULAR | Status: AC
Start: 2009-05-11 — End: 2009-05-11
  Filled 2009-05-11: qty 1

## 2009-05-11 MED ORDER — OXYCODONE-ACETAMINOPHEN 5 MG-325 MG TABLET
1.00 | ORAL_TABLET | ORAL | Status: DC | PRN
Start: 2009-05-11 — End: 2009-05-28

## 2009-05-11 MED ORDER — FENTANYL (PF) 50 MCG/ML INJECTION SOLUTION
25.00 ug | INTRAMUSCULAR | Status: DC | PRN
Start: 2009-05-11 — End: 2009-05-11
  Filled 2009-05-11: qty 0.5

## 2009-05-11 MED ORDER — LIDOCAINE 1 %-EPINEPHRINE 1:100,000 INJECTION SOLUTION
Freq: Once | INTRAMUSCULAR | Status: DC | PRN
Start: 2009-05-11 — End: 2009-05-11
  Administered 2009-05-11: 10 mL via INTRAMUSCULAR

## 2009-05-11 SURGICAL SUPPLY — 25 items
BLADE BEAVER SURG STRL LF CER V BIOPSY (SURGICAL CUTTING SUPPLIES) ×1 IMPLANT
CATH URETH DOVER RBNL 14FR 16IN Ã¡STGR DRAIN EYE RND CLS TIP INT FNL CONN PVC STRL LF  DISP (UROLOGICAL SUPPLIES) ×1 IMPLANT
CONV USE ITEM 156524 - ADHESIVE TISSUE EXOFIN 1.0ML_PREMIERPRO EXOFIN (SEALANTS) IMPLANT
COUNTER 40 CNT MAG DVN BOXLOCKS STRL LF  BLK SHARP 1840 PLASTIC FOAM XL DISP (NEEDLES & SYRINGE SUPPLIES) ×1 IMPLANT
DISCONTINUED USE ITEM 91401 - SUTURE 0 UR-6 VICRYL 27IN VIOL BRD COAT ABS (SUTURE/WOUND CLOSURE) IMPLANT
DONUT EXTREMITY CUSHIONING 31143137 (POSITIONING PRODUCTS) ×1 IMPLANT
DUPE USE ITEM 319452 - SUTURE 0 CT2 VICRYL 27IN VIOL_BRD COAT ABS (SUTURE/WOUND CLOSURE) IMPLANT
ELECTRODE ESURG BALL 5MM STRL SS DISP ELMNT CX CNTMN STD SHAFT (Electrical Supplies) ×1 IMPLANT
ELECTRODE ESURG BLADE PNCL 10FT VLAB STRL SS DISP BUTTON SWH HEX LOCK CORD HLSTR LF  ACPT 3/32IN STD (CAUTERY SUPPLIES) ×1 IMPLANT
EXCISOR BIOPSY FSCHR MED CONE DIST TIP CURVE STP ARM INSL SHAFT LNR RIGID WRE (SURGICAL INSTRUMENTS) IMPLANT
HANDLE RIGID PLASTIC STRL LF  DISP DVN EZ HNDL SURG LIGHT (INSTRUMENTS) ×1
HANDPC SUCT MEDIVAC YANKAUER BLBS TIP CLR STRL LF  DISP (Suction) ×1 IMPLANT
HEMOSTAT ABS 8X4IN FLXB SHR WV_SRGCL STRL DISP (WOUND CARE SUPPLY) ×1 IMPLANT
KIT RM TURNOVER CLEANOP CSTM INFCT CONTROL (KITS & TRAYS (DISPOSABLE)) ×1
KIT RM TURNOVER CLEANOP CUSTOM INFCT CONTROL (KITS & TRAYS (DISPOSABLE)) ×1 IMPLANT
MBO USE ITEM 317672 - HANDLE RIGID PLASTIC STRL LF  DISP DVN EZ HNDL SURG LIGHT (SURGICAL INSTRUMENTS) ×1 IMPLANT
NEEDLE SPINAL BLK 3.5IN 22GA QUINCKE REG WL POLYPROP QUINCKE TIP STRL LF  DISP (ANETHESIA SUPPLIES) ×1 IMPLANT
PACK GYN/PERI 9165 9165 (DRAPE/PACKS/SHEETS/OR TOWEL) ×1 IMPLANT
PAD ARMBRD BLU (POSITIONING PRODUCTS) ×1 IMPLANT
PAD RELEASE 3 X 4 IN_1050 50/BX (WOUND CARE SUPPLY) ×1 IMPLANT
SLEEVE SCD EXPRESS KNEE REG 5 PER CASE 9529 (EQUIPMENT MINOR) ×1 IMPLANT
SUTURE 0 UR-6 VICRYL 27IN VIOL BRD COAT ABS (SUTURE/WOUND CLOSURE)
SYRINGE LL 10ML LF  STRL CONTROL CONCEN TIP PRGN FREE DEHP-FR MED DISP (NEEDLES & SYRINGE SUPPLIES) ×1 IMPLANT
TRAY SKIN SCRUB 8IN VNYL COTTON 6 WNG 6 SPONGE STICK 2 TIP APPL DRY STRL LF (KITS & TRAYS (DISPOSABLE)) ×1 IMPLANT
TUBING SUCT CLR 20FT 9/32IN MEDIVAC NCDTV M/M CONN STRL LF (Suction) ×1 IMPLANT

## 2009-05-11 NOTE — Progress Notes (Signed)
ANESTHESIA POSTOP EVALUATION NOTE  Anesthesia Service  [facility]      05/11/2009     Patient Name:                    Beverly Blair   Medical record Number:   [id number]   Date of Birth:                     1940/10/12     Temperature: 36.6 C (97.9 F) ( Simultaneous filing. User may not have seen previous data.) (05/11/09 10:11 AM)  Heart Rate: 89  ( Simultaneous filing. User may not have seen previous data.) (05/11/09 10:11 AM)  BP (Non-Invasive): 149/70 mmHg ( Simultaneous filing. User may not have seen previous data.) (05/11/09 10:11 AM)  Respiratory Rate: 18  ( Simultaneous filing. User may not have seen previous data.) (05/11/09 10:11 AM)  SpO2-1: 95 % ( Simultaneous filing. User may not have seen previous data.) (05/11/09 10:11 AM)  Weight: 64.5 kg (142 lb 3.2 oz) (05/11/09  6:32 AM)    Patient is sufficiently recovered from the effects of anesthesia to participate in the evaluation and has returned to their pre-procedure level.    I have reviewed and evaluated the following:  Respiratory Function:  Consistent with pre anesthetic level  Cardiovascular Function:  Consistent with pre anesthetic level  Mental Status:  Return to pre anesthetic baseline level  Pain:  Sufficiently controlled with medication  Nausea and Vomiting:  Absent or sufficiently controlled with medication    Post operative complications: None    Comment/ re-evaluation for any variations:  None     Alleen Borne, MD 05/11/2009, 10:38 AM     Faculty Note:  I examined the patient and agree with the above details of postoperative assessment.

## 2009-05-11 NOTE — OR PreOp (Signed)
Pt to 5N preop area, preop routine discussed.  Pt with no family present.  Ride home is Darel Hong at [phone]

## 2009-05-11 NOTE — Progress Notes (Addendum)
[facility]    Beverly Blair  [id number]  05/11/2009    Discharge Note    Discharge patient home when meets criteria.  Follow up with Dr. Cecelia Byars in 2 weeks.  Home with motrin and percocet for pain control.    Honey Mahmoudi, MD 05/11/2009, 8:33 AM  Edwyna Perfect, MD 05/11/2009, 1:57 PM

## 2009-05-11 NOTE — Nurses Notes (Signed)
Pt discharged to lobby via wheelchair by Lenore PCA.

## 2009-05-11 NOTE — Discharge Instructions (Signed)
SURGICAL DISCHARGE INSTRUCTIONS     Dr. Leim Fabry Hashmi  performed your BIOPSY CERVIX CONE today at the [facility]    [facility]:  Monday through Friday from 6 a.m. - 7 p.m.: [phone]  Between 7 p.m. - 6 a.m., weekends and holidays:  Call [facility] at [phone] or [phone].        SIGNS AND SYMPTOMS OF A WOUND / INCISION INFECTION   Be sure to watch for the following:   Increase in redness or red streaks near or around the wound or incision.  Increase in pain that is intense or severe and cannot be relieved by the pain medication that your doctor has given you.  Increase in swelling that cannot be relieved by elevation of a body part, or by applying ice, if permitted.  Increase in drainage, or if yellow / green in color and smells bad. This could be on a dressing or a cast.  Increase in fever for longer than 24 hours, or an increase that is higher than 101 degrees Fahrenheit (normal body temperature is 98 degrees Fahrenheit). The incision may feel warm to the touch.    **CALL YOUR DOCTOR IF ONE OR MORE OF THESE SIGNS / SYMPTOMS SHOULD OCCUR.    ANESTHESIA INFORMATION   ANESTHESIA -- ADULT PATIENTS:  You have received intravenous sedation / general anesthesia, and you may feel drowsy and light-headed for several hours. You may even experience some forgetfulness of the procedure. DO NOT DRIVE A MOTOR VEHICLE or perform any activity requiring complete alertness or coordination until you feel fully awake in about 24-48 hours. Do not drink alcoholic beverages for at least 24 hours. Do not stay alone, you must have a responsible adult available to be with you. You may also experience a dry mouth or nausea for 24 hours. This is a normal side effect and will disappear as the effects of the medication wear off.    REMEMBER   If you experience any difficulty breathing, chest pain, bleeding that you feel is excessive, persistent nausea or vomiting or for any other concerns:  Call  your physician Dr. Cecelia Byars at [phone] or [phone]. You may also ask to have the GYN doctor on call paged. They are available to you 24 hours a day.    SPECIAL INSTRUCTIONS / COMMENTS   Signs and symptoms of infection    FOLLOW-UP APPOINTMENTS   Please call patient services at [phone] or [phone] to schedule a date / time of return. They are open Monday - Friday from 7:30 am - 5:00 pm.

## 2009-05-11 NOTE — Nurses Notes (Signed)
Pt states that she is having 4/10 abd cramping with some back pain, pt states that when she voided here is phase II that the pain did diminish some. Pt states that she does not want any pain medication. I instructed that if pt should want any pain medication that PO medications are available for her to have. Pt states she will take Ibuprofen at home, and does not want medications here. I will cont to monitor pt and will cont to assess pt pain.

## 2009-05-11 NOTE — OR PostOp (Signed)
Pt tolerating ice chips. Denies pain or n/v

## 2009-05-11 NOTE — H&P (Addendum)
[facility]                                                     H&P UPDATE FORM    Beverly Blair, Beverly Blair, 68 y.o. female  Date of Admission:  05/09/2009  Date of Birth:  1940-10-29    05/11/2009    STOP: IF H&P IS GREATER THAN 30 DAYS FROM SURGICAL DAY COMPLETE NEW H&P IS REQUIRED.    Outpatient Pre-Surgical H & P updated the day of the procedure.  1.  H&P assessment remains unchanged based on completion of re-assessment.      Change in medications: No      Last Menstrual Period: Postmenopause    2.  Patient continues to be appropiate candidate for planned surgical procedure. YES  Beverly Lobosco Cecelia Byars, MD 05/11/2009, 7:20 AM        Shelba Flake, MD 6:43 AM

## 2009-05-11 NOTE — Progress Notes (Signed)
Discharge Note  Patient to home per anesthesia when meets criteria.  Patient has followup appt for 2 weeks.  Pelvic rest and no baths for 3 weeks.  Edwyna Perfect, MD

## 2009-05-11 NOTE — OR PostOp (Signed)
Pt to pacu history and allergies reviewed by green at bedside. Pt awake, denies pain at this time. Will continue to monitor.

## 2009-05-11 NOTE — Nurses Notes (Signed)
Discharge instructions given to family friend and pt. No questions asked. Will cont to monitor and assess pt.

## 2009-05-11 NOTE — Progress Notes (Signed)
Patient seen at 0707  Questions answered and procedure reviewed.  Postop followup and instructions given.  Edwyna Perfect, MD 05/11/2009, 7:16 AM

## 2009-05-12 LAB — HISTORICAL SURGICAL PATHOLOGY SPECIMEN

## 2009-05-12 NOTE — OR Surgeon (Signed)
[facility]                                  DEPARTMENT OF OBSTETRICS AND GYNECOLOGY                                     OPERATION SUMMARY    PATIENT NAME: Beverly Blair, Beverly Blair [facility] NUMBER:[id number]  DATE OF SERVICE:05/11/2009  DATE OF BIRTH: [birth year]    PREOPERATIVE DIAGNOSES:  1. Cervical dysplasia.  2. Inadequate colposcopy.  3. Complete cervical stenosis.    POSTOPERATIVE DIAGNOSES:  1. Cervical dysplasia.  2. Inadequate colposcopy.  3. Complete cervical stenosis.    NAME OF PROCEDURE:  Cold knife cone biopsy with post-cone endocervical curettage.    SURGEONS:  Edwyna Perfect MD (staff), Shelba Flake MD (assistant).    ANESTHESIA:  General with LMA.    ESTIMATED BLOOD LOSS:  Minimal.    COMPLICATIONS:  Completely stenotic and flush cervix with the vagina.    INTRAVENOUS FLUIDS:  One liter.    DEEP VENOUS THROMBOSIS PROPHYLAXIS:  Venodynes.     DESCRIPTION OF PROCEDURE:  The patient was taken to the operating room.  After adequate general anesthesia was induced with LMA, the patient was prepped and draped in usual sterile fashion.  Venodynes were placed prior to induction of anesthesia.  The bladder was drained for clear sterile urine.  A fine-toothed tenaculum was placed on the anterior lip of the cervix.  The cervix was almost completely flush with the vagina and difficult to assess.  Digital exam confirmed the cervix with no cervical os notable due to complete stenosis.  The anterior lip of the cervix was identified via palpation and a fine-tooth tenaculum placed on the anterior lip of the cervix.  One-percent lidocaine with epinephrine using 10 mL was injected circumferentially around the cervix for hemostasis and in the midline to help identify the cervical os.  Hemostatic stay sutures were placed at 3 and 9 o'clock using 0 Vicryl suture and tied down.  Lugol solution was placed on the cervix to identify any glycogen-depleted areas, which were none.  The pediatric uterine sound was utilized to probe the endocervical/cervical canal that was unable to be visualized and a small area in the midline was able to be penetrated.  Serial dilatation was undertaken to confirm the canal.  A Beaver blade was subsequently utilized starting at 6 o'clock and ending at 6 o'clock to encompass as much of the cervical tissue that could be identified safely.  The cone base was removed with Mayo scissors.  A post-cone ECC was performed using Kevorkian curette without difficulty.  There was some old blood that noted to come from the uterine cavity.  The ball cautery was utilized to cauterize the base of the cone and Surgicel was placed within the base and sutures tied over it.  The tenaculum was removed from the cervix.  The patient went to the recovery room in stable condition.  All sponge, needle and instrument counts were noted to be correct at the completion of the  procedure.      Edwyna Perfect, MD  Associate Professor  [facility] Department of Obstetrics and Gynecology    [id number]; D: 05/11/2009 13:57:25; T: 05/11/2009 21:55:13

## 2009-05-13 ENCOUNTER — Ambulatory Visit (HOSPITAL_BASED_OUTPATIENT_CLINIC_OR_DEPARTMENT_OTHER): Payer: Self-pay | Admitting: Obstetrics & Gynecology

## 2009-05-13 NOTE — Telephone Encounter (Addendum)
Called Beverly Blair per Dr Cecelia Byars to see how she is doing post op.    No ans, lmom for return call 9:16am      9:35am Beverly Blair left message on voicemail that she is doing just fine and thanks for the call to check on her.

## 2009-05-24 ENCOUNTER — Ambulatory Visit (HOSPITAL_BASED_OUTPATIENT_CLINIC_OR_DEPARTMENT_OTHER): Payer: No Typology Code available for payment source | Admitting: Obstetrics & Gynecology

## 2009-05-28 ENCOUNTER — Ambulatory Visit (INDEPENDENT_AMBULATORY_CARE_PROVIDER_SITE_OTHER): Payer: No Typology Code available for payment source | Admitting: Obstetrics & Gynecology

## 2009-05-28 ENCOUNTER — Encounter (HOSPITAL_BASED_OUTPATIENT_CLINIC_OR_DEPARTMENT_OTHER): Payer: Self-pay | Admitting: Obstetrics & Gynecology

## 2009-05-28 VITALS — BP 126/76 | Wt 145.0 lb

## 2009-05-28 NOTE — Progress Notes (Signed)
Patient he is here for a postoperative visit     This is a 68 y.o. white female who presents s/p cold knife cone on due to cervical dysplasia history.  Patient Active Problem List   Diagnoses Date Noted   . 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 Rosacea [695.3] 11/24/2008   . Menopausal and postmenopausal disorder [627.9C] 11/24/2008   . GERD [530.81] 08/13/2006   . Headaches [784.0] 08/13/2006   . Seizures [780.39] 02/06/2001             The patient's pathology returned with negative pathology    The patient has been doing well postoperatively without complaint. She has no pain, significant discharge or bleeding, fevers, or pelvic tenderness.      Review of systems:  The patient denies any bowel or bladder symptoms, all other systems were negative to review.    Filed Vitals:    05/28/2009 10:33 AM   BP: 126/76   Weight: 65.772 kg (145 lb)       Physical Examination:    General: no acute distress  Skin: intact, without lesions  HEENT: NCAT, EOMI  Abdomen: soft, NT, ND, no masses  Neurologic: alert and oriented, no focal deficits noted  Psychiatric: mood stable    Gynecologic:    External Genitalia: without lesions  Urethra: no lesions  Vulva: no lesions  Vagina: no lesions  Cervix:: no lesions or cervical motion tenderness, cervix is granulating in well with no abnormal discharge    Assessment/plan  1. Postop cold knife cone biopsy  The patient's pathology was discussed. Her exam was negative. She will followup in 6 months for repeat Pap. Repeat Pap intervals were discussed with the patient for 2 years at every 6 month intervals. Recurrent dysplasia discussed.  Edwyna Perfect, MD 05/28/2009, 5:11 PM

## 2009-07-01 ENCOUNTER — Encounter (INDEPENDENT_AMBULATORY_CARE_PROVIDER_SITE_OTHER): Payer: No Typology Code available for payment source | Admitting: DERMATOLOGY

## 2009-07-19 ENCOUNTER — Ambulatory Visit (HOSPITAL_BASED_OUTPATIENT_CLINIC_OR_DEPARTMENT_OTHER): Payer: Self-pay | Admitting: Obstetrics & Gynecology

## 2009-07-19 ENCOUNTER — Other Ambulatory Visit (HOSPITAL_BASED_OUTPATIENT_CLINIC_OR_DEPARTMENT_OTHER): Payer: Self-pay | Admitting: Obstetrics & Gynecology

## 2009-07-19 NOTE — Telephone Encounter (Addendum)
Pt calling today stating that she had surg 05/11/09, she states she had some bleeding post op but, then it all subsided for a few days then restarted with pink to red spotting which has not subsided. Pt is afebrile and has no c/o abd pain, nausea, vomiting or any other s/s. Pt states the spotting is present when she wipes after urination and she has a scant spotting on pantyliner daily. Please advise.       Usn to evaluate endometrial lining thickness and then followup with me      Pt notified will transfer to front desk to schedule u/s then appt with Hashmi.

## 2009-07-20 ENCOUNTER — Ambulatory Visit
Admission: RE | Admit: 2009-07-20 | Discharge: 2009-07-20 | Disposition: A | Discharge status: Home / Self Care | Payer: No Typology Code available for payment source | Attending: Obstetrics & Gynecology | Admitting: Obstetrics & Gynecology

## 2009-07-20 DIAGNOSIS — N950 Postmenopausal bleeding: Secondary | ICD-10-CM | POA: Insufficient documentation

## 2009-07-26 ENCOUNTER — Ambulatory Visit (INDEPENDENT_AMBULATORY_CARE_PROVIDER_SITE_OTHER): Payer: No Typology Code available for payment source | Admitting: Obstetrics & Gynecology

## 2009-07-26 ENCOUNTER — Encounter (HOSPITAL_BASED_OUTPATIENT_CLINIC_OR_DEPARTMENT_OTHER): Payer: Self-pay | Admitting: Obstetrics & Gynecology

## 2009-07-26 VITALS — BP 124/68 | Ht 64.5 in | Wt 144.5 lb

## 2009-07-26 DIAGNOSIS — N950 Postmenopausal bleeding: Secondary | ICD-10-CM

## 2009-07-26 HISTORY — DX: Postmenopausal bleeding: N95.0

## 2009-08-02 ENCOUNTER — Encounter (HOSPITAL_BASED_OUTPATIENT_CLINIC_OR_DEPARTMENT_OTHER): Payer: Self-pay | Admitting: Obstetrics & Gynecology

## 2009-08-02 ENCOUNTER — Ambulatory Visit (INDEPENDENT_AMBULATORY_CARE_PROVIDER_SITE_OTHER): Payer: No Typology Code available for payment source | Admitting: Obstetrics & Gynecology

## 2009-08-02 ENCOUNTER — Ambulatory Visit
Admission: RE | Admit: 2009-08-02 | Discharge: 2009-08-02 | Disposition: A | Discharge status: Home / Self Care | Payer: No Typology Code available for payment source | Attending: Obstetrics & Gynecology | Admitting: Obstetrics & Gynecology

## 2009-08-02 ENCOUNTER — Encounter (HOSPITAL_COMMUNITY): Payer: Self-pay

## 2009-08-02 LAB — URINALYSIS, MACROSCOPIC AND MICROSCOPIC
BILIRUBIN: NEGATIVE
BLOOD: NEGATIVE
GLUCOSE: NEGATIVE mg/dL
HYALINE CAST: 1 /LPF (ref 0–3)
KETONES: NEGATIVE mg/dL
LEUKOCYTES: NEGATIVE
NITRITE: NEGATIVE
PROTEIN: NEGATIVE mg/dL
RBC'S: <1 /HPF (ref 0–4)
UROBILINOGEN: NORMAL mg/dL
WBC'S: NONE SEEN /HPF (ref 0–6)

## 2009-08-02 LAB — CBC/DIFF
BASOPHILS: 1 % (ref 0–1)
BASOS ABS: 0.031 THOU/uL (ref 0.0–0.2)
EOS ABS: 0.027 THOU/uL — ABNORMAL LOW (ref 0.1–0.3)
EOSINOPHIL: 0 % — ABNORMAL LOW (ref 1–6)
HCT: 39.9 % (ref 33.5–45.2)
HGB: 13.7 g/dL (ref 11.5–15.2)
LYMPHOCYTES: 36 % (ref 20–45)
LYMPHS ABS: 1.97 THOU/uL (ref 1.0–4.8)
MCH: 32.1 pg (ref 27.4–33.0)
MCHC: 34.4 g/dL (ref 31.6–35.5)
MCV: 93.4 fL (ref 82.0–99.0)
MONOCYTES: 8 % (ref 4–13)
MONOS ABS: 0.426 THOU/uL (ref 0.1–0.9)
MPV: 8.3 FL (ref 7.4–10.4)
NRBC'S: 0 /100{WBCs}
PLATELET COUNT: 230 THO/UL (ref 140–450)
PMN ABS: 3.05 THOU/uL (ref 1.5–7.7)
PMN'S: 55 % (ref 40–75)
RBC: 4.27 MIL/uL (ref 3.84–5.04)
RDW: 11.4 % (ref 10.2–14.0)
WBC: 5.5 THOU/UL (ref 3.5–11.0)

## 2009-08-02 LAB — PERFORM POC WHOLE BLOOD GLUCOSE: GLUCOSE, POINT OF CARE: 100 mg/dL (ref 70–105)

## 2009-08-02 LAB — TYPE AND SCREEN
ABO/RH(D): A POS
ANTIBODY SCREEN: NEGATIVE

## 2009-08-02 LAB — ELECTROLYTES
CARBON DIOXIDE: 25 mmol/L (ref 22–32)
CHLORIDE: 102 mmol/L (ref 96–111)
SODIUM: 140 mmol/L (ref 136–145)

## 2009-08-02 LAB — BUN
BUN/CREAT RATIO: 18 (ref 6–22)
BUN: 13 mg/dL (ref 6–20)

## 2009-08-02 LAB — CREATININE
CREATININE: 0.72 mg/dL (ref 0.49–1.10)
ESTIMATED GLOMERULAR FILTRATION RATE: >59 mL/min/{1.73_m2} (ref >59)
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
## 2009-08-03 ENCOUNTER — Telehealth (HOSPITAL_BASED_OUTPATIENT_CLINIC_OR_DEPARTMENT_OTHER): Payer: Self-pay | Admitting: Obstetrics & Gynecology

## 2009-08-03 ENCOUNTER — Telehealth (HOSPITAL_BASED_OUTPATIENT_CLINIC_OR_DEPARTMENT_OTHER): Payer: Self-pay | Admitting: Family Medicine

## 2009-08-03 NOTE — Telephone Encounter (Signed)
Patient had an appt for preop on Monday.  At that time she had a scratchy throat.  It is now really sore and she feels like she has sinus pressure and a headache.  Is there anything she can take OTC to help clear it up before surgery?  Thanks.

## 2009-08-03 NOTE — Telephone Encounter (Signed)
Staff Message copied by Theotis Barrio on Tue Aug 03, 2009 3:11 PM  ------   Message from: Edwyna Perfect   Created: Fri Jul 30, 2009 4:16 PM    Lavina Hamman  I called Lupita Leash in scheduling today to get this patient on to follow my case on Friday 2-25 and she said it is now on for 10:30 for a d and c , h/s and possible polyp removal with the morcellator for pmb-can you confirm that everything is done and that we are good to go--thx    CJ  Call her to come in on Monday for her preop with me and put her in [place] Bazelak's spot who will not be there on Monday  thx

## 2009-08-04 LAB — URINE CULTURE

## 2009-08-04 NOTE — Telephone Encounter (Signed)
Spoke with patient.  She will pick up script today.  Called in script per Hashmi.

## 2009-08-04 NOTE — Student (Addendum)
08/04/2009  Margrett Kalb  [id number]    Pre-Op Note    Date:  08/06/09  Start Time:  0910  End Time:  1050    Surgeon:  Dr. Cecelia Byars    Pre-Op Diagnosis:    Post menopausal bleeding    Procedure:    Hysteroscopy with dilation and curettage  Hysteroscopy with morcellator  Polypectomy    Labs:    Results for orders placed during the hospital encounter of 08/02/2009 (from the past 336 hours)   BUN    Collection Time 08/02/09 12:31 PM   Component Value Range   . BUN 13  6 - 20 (mg/dL)   . BUN/CREAT RATIO 18  6 - 22    CBC/DIFF    Collection Time 08/02/09 12:31 PM   Component Value Range   . WBC 5.5  3.5 - 11.0 (THOU/UL)   . RBC 4.27  3.84 - 5.04 (MIL/uL)   . HGB 13.7  11.5 - 15.2 (g/dL)   . HCT 39.9  33.5 - 45.2 (%)   . MCV 93.4  82.0 - 99.0 (fL)   . MCH 32.1  27.4 - 33.0 (pg)   . MCHC 34.4  31.6 - 35.5 (g/dL)   . RDW 11.4  10.2 - 14.0 (%)   . PLATELET COUNT 230  140 - 450 (THO/UL)   . MPV 8.3  7.4 - 10.4 (FL)   . PMN'S 55  40 - 75 (%)   . PMN ABS 3.050  1.5 - 7.7 (THOU/uL)   . LYMPHOCYTES 36  20 - 45 (%)   . LYMPHS ABS 1.970  1.0 - 4.8 (THOU/uL)   . MONOCYTES 8  4 - 13 (%)   . MONOS ABS 0.426  0.1 - 0.9 (THOU/uL)   . EOSINOPHIL 0 (*) 1 - 6 (%)   . EOS ABS 0.027 (*) 0.1 - 0.3 (THOU/uL)   . BASOPHILS 1  0 - 1 (%)   . BASOS ABS 0.031  0.0 - 0.2 (THOU/uL)   . NRBC'S 0  0 (/100WBC)   CREATININE    Collection Time 08/02/09 12:31 PM   Component Value Range   . CREATININE 0.72  0.49 - 1.10 (mg/dL)   . ESTIMATED GLOMERULAR FILTRATION RATE >59  >59 (ml/min/1.58m2)   ELECTROLYTES    Collection Time 08/02/09 12:31 PM   Component Value Range   . SODIUM 140  136 - 145 (mmol/L)   . POTASSIUM 4.0  3.5 - 5.1 (mmol/L)   . CHLORIDE 102  96 - 111 (mmol/L)   . CARBON DIOXIDE 25  22 - 32 (mmol/L)   . ANION GAP 13  5 - 16 (mmol/L)   URINALYSIS (ROUTINE)    Collection Time 08/02/09 12:31 PM   Component Value Range   . CHARACTER CLEAR     . COLOR YELLOW     . SPECIFIC GRAVITY, URINE 1.010  1.005 - 1.030    . GLUCOSE NEGATIVE  NEGATIVE (mg/dL)      . BILIRUBIN NEGATIVE  NEGATIVE    . KETONES NEGATIVE  NEGATIVE (mg/dL)   . BLOOD NEGATIVE  NEGATIVE    . PH URINE 5.0  5.0 - 8.0    . PROTEIN NEGATIVE  NEGATIVE (mg/dL)   . UROBILINOGEN NORMAL  0.2 (mg/dL)   . NITRITE NEGATIVE  NEGATIVE    . LEUKOCYTES NEGATIVE  NEGATIVE    . RBC'S    0 - 4 (/HPF)    Value: <1  MICROSCOPIC ANALYSIS PERFORMED BY  AUTOMATED METHOD   . WBC'S NONE SEEN  0 - 6 (/HPF)   . BACTERIA OCCASIONAL OR LESS  OCL    . Squamous Epithelial RARE  (/LPF)   . Mucous LIGHT  (/LPF)   . Hyaline Cast 1  0 - 3 (/LPF)   URINE CULTURE (OUTPT)    Collection Time 08/02/09 12:31 PM   Component Value Range   . SPECIMEN DESCRIPTION CLEAN CATCH URINE     . SPECIAL REQUESTS NONE     . GRAM STAIN NOT REPORTED     . CULTURE OBSERVATION        Value: Low number of organisms present, suggestive of contamination.  If further workup is needed, Best boy.  Plates will be held 3 days.   Marland Kitchen REPORT STATUS        Value: 08/04/2009      FINAL   TYPE AND SCREEN    Collection Time 08/02/09 12:31 PM   Component Value Range   . UNITS ORDERED NOT STATED         . ABO/RH(D) A POSITIVE     . ANTIBODY SCREEN NEGATIVE     . SPECIMEN EXPIRATION DATE 08/05/2009     Results for orders placed during the hospital encounter of 08/06/2009 (from the past 336 hours)   POCT WHOLE BLOOD GLUCOSE    Collection Time 08/02/09 12:30 PM   Component Value Range   . GLUCOSE, POINT OF CARE 100  70 - 105 (mg/dL)     Chest X-Ray:    Not on file    EKG:    Sinus rhythm  Left axis deviation  (performed 05/04/09)    Blood:      ABO/RH(D)  A POSITIVE    ANTIBODY SCREEN   NEGATIVE     Anesthesia:    General    Consent:    Obtained 08/02/09    Orders:    Insert and maintain peripheral IV access   Diet NPO - Now   Vital Signs: Per RDSC routine   Height and weight   Surgical site to be marked by surgical team with marking pen / initials or "yes" or alternative site marking completed if unable to sign site   Place sequential compression devices in OR  LR 1000  mL premix infusion  VOID ON ADMISSION AND ON CALL TO OR-ONE TIME    Cori Razor, MED STUDENT 08/04/2009, 11:36 AM  Edwyna Perfect, MD 08/04/2009, 3:04 PM

## 2009-08-06 ENCOUNTER — Encounter (HOSPITAL_COMMUNITY): Payer: Self-pay

## 2009-08-06 ENCOUNTER — Encounter (HOSPITAL_BASED_OUTPATIENT_CLINIC_OR_DEPARTMENT_OTHER): Payer: No Typology Code available for payment source | Admitting: Obstetrics & Gynecology

## 2009-08-06 ENCOUNTER — Other Ambulatory Visit (HOSPITAL_COMMUNITY): Payer: Self-pay | Admitting: Obstetrics & Gynecology

## 2009-08-06 ENCOUNTER — Encounter (HOSPITAL_COMMUNITY)
Admission: RE | Disposition: A | Discharge status: Home / Self Care | Payer: Self-pay | Source: Ambulatory Visit | Attending: Obstetrics & Gynecology

## 2009-08-06 ENCOUNTER — Inpatient Hospital Stay
Admission: RE | Admit: 2009-08-06 | Discharge: 2009-08-06 | Disposition: A | Discharge status: Home / Self Care | Payer: No Typology Code available for payment source | Attending: Obstetrics & Gynecology | Admitting: Obstetrics & Gynecology

## 2009-08-06 DIAGNOSIS — N840 Polyp of corpus uteri: Secondary | ICD-10-CM | POA: Insufficient documentation

## 2009-08-06 HISTORY — PX: HX DILATION AND CURETTAGE: SHX78

## 2009-08-06 LAB — TYPE AND SCREEN
ABO/RH(D): A POS
ANTIBODY SCREEN: NEGATIVE

## 2009-08-06 SURGERY — HYSTEROSCOPY WITH DILATION AND CURETTAGE
Anesthesia: General | Wound class: Clean Contaminated Wounds-The respiratory, GI, Genital, or urinary

## 2009-08-06 MED ORDER — HYDROMORPHONE (PF) 2 MG/ML INJECTION SYRINGE
0.20 mg | INJECTION | INTRAMUSCULAR | Status: DC | PRN
Start: 2009-08-06 — End: 2009-08-07
  Administered 2009-08-06: 0.4 mg via INTRAVENOUS

## 2009-08-06 MED ORDER — SODIUM CHLORIDE 0.9 % INJECTION SOLUTION
INTRAMUSCULAR | Status: AC
Start: 2009-08-06 — End: 2009-08-06
  Filled 2009-08-06: qty 10

## 2009-08-06 MED ORDER — DROPERIDOL 2.5 MG/ML INJECTION SOLUTION
INTRAMUSCULAR | Status: AC
Start: 2009-08-06 — End: 2009-08-06
  Filled 2009-08-06: qty 2

## 2009-08-06 MED ORDER — ONDANSETRON HCL (PF) 4 MG/2 ML INJECTION SOLUTION
4.00 mg | Freq: Once | INTRAMUSCULAR | Status: DC | PRN
Start: 2009-08-06 — End: 2009-08-07

## 2009-08-06 MED ORDER — LIDOCAINE (PF) 100 MG/5 ML (2 %) INTRAVENOUS SYRINGE
INJECTION | INTRAVENOUS | Status: AC
Start: 2009-08-06 — End: 2009-08-06
  Filled 2009-08-06: qty 10

## 2009-08-06 MED ORDER — OXYCODONE-ACETAMINOPHEN 5 MG-325 MG TABLET
1.00 | ORAL_TABLET | ORAL | Status: DC | PRN
Start: 2009-08-06 — End: 2009-08-16

## 2009-08-06 MED ORDER — FENTANYL (PF) 50 MCG/ML INJECTION SOLUTION
INTRAMUSCULAR | Status: AC
Start: 2009-08-06 — End: 2009-08-06
  Filled 2009-08-06: qty 4

## 2009-08-06 MED ORDER — SODIUM CHLORIDE 0.9 % IRRIGATION SOLUTION
3000.00 mL | Status: DC | PRN
Start: 2009-08-06 — End: 2009-08-06
  Administered 2009-08-06: 3000 mL
  Filled 2009-08-06: qty 3000

## 2009-08-06 MED ORDER — OXYCODONE-ACETAMINOPHEN 5 MG-325 MG TABLET
1.00 | ORAL_TABLET | ORAL | Status: DC | PRN
Start: 2009-08-06 — End: 2009-08-07

## 2009-08-06 MED ORDER — HYDROMORPHONE (PF) 2 MG/ML INJECTION SYRINGE
INJECTION | INTRAMUSCULAR | Status: AC
Start: 2009-08-06 — End: 2009-08-06
  Filled 2009-08-06: qty 1

## 2009-08-06 MED ORDER — DEXAMETHASONE SODIUM PHOSPHATE 10 MG/ML INJECTION SOLUTION
INTRAMUSCULAR | Status: AC
Start: 2009-08-06 — End: 2009-08-06
  Filled 2009-08-06: qty 2

## 2009-08-06 MED ORDER — CEFAZOLIN 1 GRAM INTRAVENOUS SOLUTION
INTRAVENOUS | Status: AC
Start: 2009-08-06 — End: 2009-08-06
  Filled 2009-08-06: qty 10

## 2009-08-06 MED ORDER — EPHEDRINE SULFATE 50 MG/ML INJECTION SOLUTION
INTRAMUSCULAR | Status: AC
Start: 2009-08-06 — End: 2009-08-06
  Filled 2009-08-06: qty 2

## 2009-08-06 MED ORDER — LACTATED RINGERS INTRAVENOUS SOLUTION
INTRAVENOUS | Status: DC
Start: 2009-08-06 — End: 2009-08-07
  Administered 2009-08-06: 0 via INTRAVENOUS

## 2009-08-06 MED ORDER — IBUPROFEN 600 MG TABLET
600.00 mg | ORAL_TABLET | Freq: Four times a day (QID) | ORAL | Status: DC | PRN
Start: 2009-08-06 — End: 2009-08-16

## 2009-08-06 MED ORDER — ONDANSETRON HCL (PF) 4 MG/2 ML INJECTION SOLUTION
INTRAMUSCULAR | Status: AC
Start: 2009-08-06 — End: 2009-08-06
  Filled 2009-08-06: qty 4

## 2009-08-06 MED ORDER — MIDAZOLAM 1 MG/ML INJECTION SOLUTION
INTRAMUSCULAR | Status: AC
Start: 2009-08-06 — End: 2009-08-06
  Filled 2009-08-06: qty 2

## 2009-08-06 SURGICAL SUPPLY — 35 items
ADAPTER CATH 9FR SLFSL FEMALE LL CKFL STRL DISP (UROLOGICAL SUPPLIES) IMPLANT
ADAPTER CATH CKFL SLFSL STD FE_MALE LL 9FR INSTR STRL DISP (UROLOGICAL SUPPLIES)
APPL 70% ISPRP 2% CHG 26ML CHLRPRP HI-LT ORNG PREP STRL LF  DISP CLR (WOUND CARE SUPPLY) ×1 IMPLANT
BLANKET 3M BAIR HUG ADLT UPR B ODY 74X24IN PLMR 2 INCS ADH (MISCELLANEOUS PT CARE ITEMS) ×1 IMPLANT
CAN SUCT 3L HIFLO (Suction) ×3 IMPLANT
CATH URETH DOVER RBNL 14FR 16IN Ã¡STGR DRAIN EYE RND CLS TIP INT FNL CONN PVC STRL LF  DISP (UROLOGICAL SUPPLIES) ×1 IMPLANT
CONV USE ITEM 156524 - ADHESIVE TISSUE EXOFIN 1.0ML_PREMIERPRO EXOFIN (SEALANTS) IMPLANT
CONV USE ITEM 322842 - PAD FLOOR SUCTION ABSORBENT_83630 (Suction) IMPLANT
DISCONTINUED NO SUB - JELLY LUB DYNALUBE BCTRST WATER SOL NGRS PKT STRL 5GM LF (WOUND CARE SUPPLY) ×2 IMPLANT
DONUT EXTREMITY CUSHIONING 31143137 (POSITIONING PRODUCTS) ×1 IMPLANT
DRAPE FL CNTRL PCH DRAIN PORT FILTER SCRN 38X15IN UNDR BUTT CNVRT LF  STRL DISP SURG 27IN (PROTECTIVE PRODUCTS/GARMENTS) ×1 IMPLANT
DRAPE POUCH IRRIG 19X23IN 1016_10EA/BX (PROTECTIVE PRODUCTS/GARMENTS) ×1 IMPLANT
HANDLE RIGID PLASTIC STRL LF  DISP DVN EZ HNDL SURG LIGHT (INSTRUMENTS) ×1
KIT HYSTEROSCOPIC PROCEDURE 7209827 (Other Miscellaneous) ×1 IMPLANT
KIT RM TURNOVER CLEANOP CSTM INFCT CONTROL (KITS & TRAYS (DISPOSABLE)) ×1
KIT RM TURNOVER CLEANOP CUSTOM INFCT CONTROL (KITS & TRAYS (DISPOSABLE)) ×1 IMPLANT
MBO USE ITEM 317672 - HANDLE RIGID PLASTIC STRL LF  DISP DVN EZ HNDL SURG LIGHT (SURGICAL INSTRUMENTS) ×1 IMPLANT
MRCLTR SURG TRUCLEAR ROT HYSCP DISP (SURGICAL CUTTING SUPPLIES) ×1 IMPLANT
MRCLTR SURG TRUCLEAR ROT HYSCP_DISP (CUTTING ELEMENTS) ×1
MRCLTR SURG TRUCLEAR ULTRA + RECIPROCATE HYSCP 4MM (SURGICAL CUTTING SUPPLIES) IMPLANT
NEEDLE SPINAL BLK 3.5IN 22GA QUINCKE REG WL POLYPROP QUINCKE TIP STRL LF  DISP (ANETHESIA SUPPLIES) IMPLANT
NEEDLE SPINAL BLK 3.5IN 22GA Q_UINCKE REG WL POLYPROP REG BVL (ANETHESIA SUPPLIES)
PACK GYN/PERI 9165 9165 (DRAPE/PACKS/SHEETS/OR TOWEL) ×1 IMPLANT
PAD ARMBRD BLU (POSITIONING PRODUCTS) ×1 IMPLANT
PAD RELEASE 3 X 4 IN_1050 50/BX (WOUND CARE SUPPLY) ×2 IMPLANT
PEN SURG MRKNG WRITESITE + RLR LBL SET 3X DARKER FORMULATE GNTN VIOL INK STRL LF  CHLRPRP (MISCELLANEOUS PT CARE ITEMS) ×1 IMPLANT
SET TUBING 2 PNCT CANN HAMOU EMAT STRL DISP (SURGICAL INSTRUMENTS) IMPLANT
SET TUBING Y CONN STRL ENTRAL (Suction) IMPLANT
SLEEVE SCD EXPRESS KNEE REG 5 PER CASE 9529 (EQUIPMENT MINOR) ×1 IMPLANT
SOL IRRG 0.9% NACL 3L PLASTIC CONTAINR UROMATIC LF (SOLUTIONS) ×1 IMPLANT
SOL IRRG UROMATIC VIAFLEX 1.5% GLY PLASTIC CONTAINR PH 6 3L (SOLUTIONS) IMPLANT
SOL IV 0.9% NACL 100ML PH 5 PLASTIC CONTAINR PRSV FR VIAFLEX (SOLUTIONS) IMPLANT
SPONGE SURG 4X4IN 16 PLY RADOPQ BAND VISTEC STRL LF  BLU WHT (WOUND CARE SUPPLY) ×1 IMPLANT
SYRINGE LL 10ML LF  STRL CONTROL CONCEN TIP PRGN FREE DEHP-FR MED DISP (NEEDLES & SYRINGE SUPPLIES) IMPLANT
TRAY SKIN SCRUB 8IN VNYL COTTON 6 WNG 6 SPONGE STICK 2 TIP APPL DRY STRL LF (KITS & TRAYS (DISPOSABLE)) ×1 IMPLANT

## 2009-08-06 NOTE — OR Surgeon (Addendum)
[facility]                                                     BRIEF OPERATIVE NOTE    Patient Name: [facility] Number: [id number]  Date of Service: 08/06/2009   Date of Birth: 11/16/1940        Pre-Operative Diagnosis: postmenopausal bleeding     Post-Operative Diagnosis: same    Procedure(s)/Description:  Operative hysteroscopy, polypectomy with morcellator, D&C    Findings: large anterior wall endometrial polyp, atrophic endometrium     Attending Surgeon: Cecelia Byars    Assistant(s): Posey- PGY3    Anesthesia Type: General LMA anesthesia    Estimated Blood Loss:  Minimal     Blood Given: None          Fluids Given: LR 1L    Complications:  None    Tubes: None    Drains: None           Specimens/ Cultures: endometrial polyp, endometrial curettings           Implants: None           Disposition: PACU - hemodynamically stable.           Condition: stable    DVT: Venodynes    I and O's:=40 cc deficit.    [name], MD 08/06/2009, 9:58 AM  Edwyna Perfect, MD 08/06/2009, 10:13 AM

## 2009-08-06 NOTE — OR PostOp (Signed)
Patient discharged via w/c with friend and escorted by Darcie Jones PCA.

## 2009-08-06 NOTE — Progress Notes (Addendum)
D/c to home when meets criteria  Motrin, percocet for pain  RTC 2 weeks for follow up with Dr. Cecelia Byars  Precautions given in discharge instructions.    [name], MD 08/06/2009, 10:02 AM  Edwyna Perfect, MD 08/06/2009, 11:49 AM

## 2009-08-06 NOTE — OR PostOp (Signed)
Abdomen is soft; pt. W/ no c/o voiced.

## 2009-08-06 NOTE — OR Surgeon (Signed)
[facility]                                  DEPARTMENT OF OBSTETRICS AND GYNECOLOGY                                     OPERATION SUMMARY    PATIENT NAME: Beverly Blair, Beverly Blair [facility] NUMBER:[id number]  DATE OF SERVICE:08/06/2009  DATE OF BIRTH: [birth year]    PREOPERATIVE DIAGNOSIS:  Postmenopausal bleeding.    POSTOPERATIVE DIAGNOSIS:  Postmenopausal bleeding.    NAME OF PROCEDURES:  1.  Operative hysteroscopy.  2.  Polypectomy with morcellator.  3.  Dilation and curettage.      OPERATIVE FINDINGS:    1.  Large anterior wall endometrial polyp.  2.  Atrophic endometrium.      SURGEONS:  Edwyna Perfect MD (attending), [name] MD (assistant).    ANESTHESIA:  General LMA.    ESTIMATED BLOOD LOSS:  Minimal.    FLUIDS GIVEN:  1 liter of LR.  I's and O's: matched    COMPLICATIONS:  None.    SPECIMENS:  1.  Endometrial polyp.  2.  Endometrial curettings.    DISPOSITION:  Hemodynamically stable to PACU.     DESCRIPTION OF PROCEDURE:  After informed consent was obtained, the patient was taken to the operating room where she was placed in the dorsal supine position. Venodynes were placed prior to the induction of anesthesia. A surgical pause was performed and the patient was identified by anesthesia, nursing and surgical staff and the proposed procedure agreed upon.  After general anesthesia with LMA was induced, the patient was prepped and draped in normal sterile fashion in the dorsal lithotomy position.  Her bladder was drained of clear sterile urine.  An Auvard weighted speculum was placed into the vagina and a single-tooth tenaculum was placed in the anterior lip of the cervix.  The cervical os was very stenotic due to history of a recentcone biopsy.  Serial dilation was undertaken until the hysteroscope could be inserted without difficulty.  Upon visualization of the cavity and a large anterior wall endometrial polyp was noted.  The polyp was then removed with the hysteroscopic morcellator.  After removal of the polyp, visualization of the endometrial cavity showed the tubal ostia to be normal.  The hysteroscope was then removed and a four-quadrant D and C was performed starting at 12 o'clock and ending at 12 o'clock and proceeding until a uterine crie was obtained.  The endometrial curettings were collected for pathology.  The single toothed tenaculum was then removed from the anterior lip of the cervix and there was a small amount of bleeding from the tenaculum site.  Ring forceps was placed on the anterior lip of the cervix for approximately 1 minute and after removal the tenaculum site was hemostatic.  All instruments were then removed from the vagina.  All counts were correct at the end of the procedure x2.  The patient tolerated the procedure well and was transported to the recovery room in stable condition.  Dr. Cecelia Byars was present and participated in the entire procedure.  At the end of procedure and I and  Os for the hysteroscope were checked and were noted to match.      [name], MD  Resident  [facility] Department of Obstetrics and Gynecology  Edwyna Perfect, MD  Associate Professor  [facility] Department of Obstetrics and Gynecology    ZO/[id number]; D: 08/06/2009 10:07:50; T: 08/06/2009 12:15:58

## 2009-08-06 NOTE — Discharge Instructions (Signed)
SURGICAL DISCHARGE INSTRUCTIONS     Dr. Leim Fabry Hashmi  performed your HYSTEROSCOPY WITH DILATION AND CURETTAGE, POLYPECTOMY, HYSTEROSCOPY WITH MORCELLATOR today at the [facility]    [facility]:  Monday through Friday from 6 a.m. - 7 p.m.: [phone]  Between 7 p.m. - 6 a.m., weekends and holidays:  Call [facility] at [phone] or [phone].    PLEASE SEE WRITTEN HANDOUTS AS DISCUSSED BY YOUR NURSE:      SIGNS AND SYMPTOMS OF A WOUND / INCISION INFECTION   Be sure to watch for the following:   Increase in redness or red streaks near or around the wound or incision.  Increase in pain that is intense or severe and cannot be relieved by the pain medication that your doctor has given you.  Increase in swelling that cannot be relieved by elevation of a body part, or by applying ice, if permitted.  Increase in drainage, or if yellow / green in color and smells bad. This could be on a dressing or a cast.  Increase in fever for longer than 24 hours, or an increase that is higher than 101 degrees Fahrenheit (normal body temperature is 98 degrees Fahrenheit). The incision may feel warm to the touch.    **CALL YOUR DOCTOR IF ONE OR MORE OF THESE SIGNS / SYMPTOMS SHOULD OCCUR.    ANESTHESIA INFORMATION   ANESTHESIA -- ADULT PATIENTS:  You have received intravenous sedation / general anesthesia, and you may feel drowsy and light-headed for several hours. You may even experience some forgetfulness of the procedure. DO NOT DRIVE A MOTOR VEHICLE or perform any activity requiring complete alertness or coordination until you feel fully awake in about 24-48 hours. Do not drink alcoholic beverages for at least 24 hours. Do not stay alone, you must have a responsible adult available to be with you. You may also experience a dry mouth or nausea for 24 hours. This is a normal side effect and will disappear as the effects of the medication wear off.    REMEMBER   If you experience any difficulty  breathing, chest pain, bleeding that you feel is excessive, persistent nausea or vomiting or for any other concerns:  Call your physician Dr. Cecelia Byars at [phone] or [phone]. You may also ask to have the  doctor on call paged. They are available to you 24 hours a day.    SPECIAL INSTRUCTIONS / COMMENTS   No heavy lifting.  No straddle toys.  NO tub baths.  If excessive bleeding of greater than 1 pad per hour; call MD.    FOLLOW-UP APPOINTMENTS   Please call patient services at [phone] or [phone] to schedule a date / time of return. They are open Monday - Friday from 7:30 am - 5:00 pm.

## 2009-08-06 NOTE — OR PostOp (Signed)
Pt. Left via [facility] w/ staff assist, pca Darcy.  All belongings w/pt. Upon d/c.

## 2009-08-06 NOTE — H&P (Addendum)
Beverly Blair                                                     H&P Update Form    Beverly Blair, Beverly Blair, 69 y.o. female  Date of Admission:  08/06/2009  Date of Birth:  [birth year]    08/06/2009    STOP: IF H&P IS GREATER THAN 30 DAYS FROM SURGICAL DAY COMPLETE NEW H&P IS REQUIRED.    Outpatient Pre-Surgical H & P updated the day of the procedure.  1.  H&P completed within 30 days of surgical procedure was performed by Dr. Cecelia Blair on 08/02/09 and has been reviewed, the patient has been examined, and no change has occured in the patients condition since the H&P was completed.       Change in medications: Yes, Started Z-pak on 2/23      Last Menstrual Period: Post-Menopausal      Comments:     2.  Patient continues to be appropiate candidate for planned surgical procedure. YES      Beverly Pro, MD  Patient seen and postop instructions given and disc procedure  Beverly Perfect, MD 08/06/2009, 9:02 AM

## 2009-08-16 ENCOUNTER — Ambulatory Visit (INDEPENDENT_AMBULATORY_CARE_PROVIDER_SITE_OTHER): Payer: No Typology Code available for payment source | Admitting: Obstetrics & Gynecology

## 2009-08-16 ENCOUNTER — Encounter (HOSPITAL_BASED_OUTPATIENT_CLINIC_OR_DEPARTMENT_OTHER): Payer: Self-pay | Admitting: Obstetrics & Gynecology

## 2009-08-16 VITALS — BP 128/80 | Ht 63.5 in | Wt 145.0 lb

## 2009-08-16 LAB — HISTORICAL SURGICAL PATHOLOGY SPECIMEN

## 2009-08-20 ENCOUNTER — Encounter (HOSPITAL_BASED_OUTPATIENT_CLINIC_OR_DEPARTMENT_OTHER): Payer: Self-pay | Admitting: Obstetrics & Gynecology

## 2009-08-20 NOTE — Progress Notes (Signed)
This is a postop visit    S/P Operative  H/S, Polypectomy with morcellation, D and C on 08-06-09  Path=endometrial polyp and currettage negative.      Patient states that she is doing well. No complaints. No vaginal bleeding or pelvic pain. Energy is improving. No menopausal symptoms.No significant bleeding.    ROS:   Denies any fevers or chills, bladder or bowel complaints. All other systems are negative to review.    Physical Examination:  Filed Vitals:    08/16/2009 10:18 AM   BP: 128/80   Height: 1.613 m (5' 3.5")   Weight: 65.772 kg (145 lb)           General: Pleasant female in NAD  Skin: intact, without lesions  Abdomen: Soft, nontender, nondistended,no masses,  no guarding, no rebound, no peritoneal signs   Neurologic: Alert and oriented  Psychiatric: Mood stable          Assessment/plan:    1.  Postoperative visit    The patient is doing well postoperatively. Her pathology was discussed. We will continue the current management and she will followup in 3 months. Patient was instructed on bleeding and to call if does not resolve completely in next 2 weeks.  Edwyna Perfect, MD

## 2009-11-09 ENCOUNTER — Ambulatory Visit: Payer: No Typology Code available for payment source

## 2009-11-09 ENCOUNTER — Encounter (HOSPITAL_BASED_OUTPATIENT_CLINIC_OR_DEPARTMENT_OTHER): Payer: Self-pay

## 2009-11-09 VITALS — BP 122/70 | HR 80 | Temp 97.9°F | Resp 16 | Wt 141.1 lb

## 2009-11-09 DIAGNOSIS — L989 Disorder of the skin and subcutaneous tissue, unspecified: Secondary | ICD-10-CM | POA: Insufficient documentation

## 2009-11-09 DIAGNOSIS — L719 Rosacea, unspecified: Secondary | ICD-10-CM | POA: Insufficient documentation

## 2009-11-09 MED ORDER — DOXYCYCLINE MONOHYDRATE 100 MG CAPSULE
100.0000 mg | ORAL_CAPSULE | Freq: Two times a day (BID) | ORAL | Status: DC
Start: 2009-11-09 — End: 2010-01-27

## 2009-11-09 MED ORDER — TRIAMCINOLONE ACETONIDE 0.1 % TOPICAL CREAM
TOPICAL_CREAM | Freq: Three times a day (TID) | CUTANEOUS | Status: DC
Start: 2009-11-09 — End: 2009-11-29

## 2009-11-09 NOTE — Progress Notes (Signed)
See dictation

## 2009-11-10 NOTE — Progress Notes (Signed)
[facility]    [facility]      PATIENT NAME: Beverly Blair, Beverly Blair   MEDICAL RECORD NUMBER: [id number]  DATE OF BIRTH: 1941-04-03     DATE OF SERVICE: 11/09/2009    SUBJECTIVE: The patient comes in today concerned that she may have shingles. She has a small maculopapular lesion on her upper right thigh. She also shows me a small lesion on her right buttock which is papular in nature. No other complaints other than a history of rosacea. She would like a refill of her medication for that. No abdominal complaints, no dysuria, no hematuria, no hematochezia, no melena stools.    OBJECTIVE: Afebrile, weight 64 kilograms, temperature 97.9 degrees Fahrenheit, blood pressure 122/70, pulse 80, respirations 16. HEENT is within normal limits. Thyroid not enlarged. Cardiovascular regular rate. Lungs are clear. There is rosacea by history but not really noted on exam.    ASSESSMENT AND PLAN:   1. Skin lesion, maculopapular, possibly contact dermatitis on her upper right thigh with a pimple-like lesion more papular in nature on her right buttock area that is benign in appearance. Contact dermatitis. I gave her a prescription for triamcinolone cream 0.1% to be applied to her right thigh.  2. Rosacea. Refilled her doxycycline prescription. Anticipatory guidance given.      London Sheer, MD  Associate Professor  [facility]    WNU/UVO/[id number]; D: 11/09/2009 34:74:25; T: 11/10/2009 02:52:03

## 2009-11-29 ENCOUNTER — Encounter (HOSPITAL_BASED_OUTPATIENT_CLINIC_OR_DEPARTMENT_OTHER): Payer: Self-pay | Admitting: Obstetrics & Gynecology

## 2009-11-29 ENCOUNTER — Ambulatory Visit (HOSPITAL_BASED_OUTPATIENT_CLINIC_OR_DEPARTMENT_OTHER): Payer: No Typology Code available for payment source | Admitting: Obstetrics & Gynecology

## 2009-11-29 ENCOUNTER — Ambulatory Visit
Admission: RE | Admit: 2009-11-29 | Discharge: 2009-11-29 | Disposition: A | Discharge status: Home / Self Care | Payer: No Typology Code available for payment source | Attending: Obstetrics & Gynecology | Admitting: Obstetrics & Gynecology

## 2009-11-29 ENCOUNTER — Ambulatory Visit (INDEPENDENT_AMBULATORY_CARE_PROVIDER_SITE_OTHER): Payer: No Typology Code available for payment source | Admitting: Obstetrics & Gynecology

## 2009-11-29 VITALS — BP 112/78 | Ht 63.5 in | Wt 140.0 lb

## 2009-11-29 DIAGNOSIS — N951 Menopausal and female climacteric states: Secondary | ICD-10-CM

## 2009-11-29 DIAGNOSIS — M8580 Other specified disorders of bone density and structure, unspecified site: Secondary | ICD-10-CM

## 2009-11-29 DIAGNOSIS — Z1151 Encounter for screening for human papillomavirus (HPV): Secondary | ICD-10-CM | POA: Insufficient documentation

## 2009-11-29 HISTORY — DX: Menopausal and female climacteric states: N95.1

## 2009-11-29 HISTORY — DX: Other specified disorders of bone density and structure, unspecified site: M85.80

## 2009-11-29 MED ORDER — CONJ ESTROGEN-MEDROXYPROGESTERONE 0.625 MG-2.5 MG TABLET
1.0000 | ORAL_TABLET | Freq: Every day | ORAL | Status: DC
Start: 2009-11-29 — End: 2011-02-23
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
## 2009-12-01 LAB — HISTORICAL CYTOPATHOLOGY-GYN (PAP AND HPV TESTS)

## 2009-12-13 ENCOUNTER — Encounter (HOSPITAL_BASED_OUTPATIENT_CLINIC_OR_DEPARTMENT_OTHER): Payer: Self-pay | Admitting: Obstetrics & Gynecology

## 2009-12-17 ENCOUNTER — Encounter (HOSPITAL_BASED_OUTPATIENT_CLINIC_OR_DEPARTMENT_OTHER): Payer: Self-pay

## 2009-12-21 ENCOUNTER — Ambulatory Visit (HOSPITAL_BASED_OUTPATIENT_CLINIC_OR_DEPARTMENT_OTHER): Payer: Self-pay | Admitting: Obstetrics & Gynecology

## 2009-12-27 ENCOUNTER — Ambulatory Visit (HOSPITAL_BASED_OUTPATIENT_CLINIC_OR_DEPARTMENT_OTHER): Payer: Self-pay | Admitting: Obstetrics & Gynecology

## 2009-12-27 NOTE — Telephone Encounter (Signed)
Pt would like to speak with you and only you about why she needs to have another colposcopy and why can can't just have a hysterectomy. Pt has many questions and concerns and feels very confused.    Pt's number is [phone]

## 2009-12-31 ENCOUNTER — Telehealth (HOSPITAL_BASED_OUTPATIENT_CLINIC_OR_DEPARTMENT_OTHER): Payer: Self-pay | Admitting: Obstetrics & Gynecology

## 2009-12-31 NOTE — Telephone Encounter (Signed)
Called patient back after message. Explained in great detail her pap and options including colpo vs hysterectomy. Disc hpv positive and neg large ckc and pap may be reactive. Doubt AIS due to negative ckc or any glandular lesion. Will plan for robotic hyst and patient agreeable. Disc with Dr Larene Pickett who agrees

## 2010-01-27 ENCOUNTER — Encounter (INDEPENDENT_AMBULATORY_CARE_PROVIDER_SITE_OTHER): Payer: Self-pay | Admitting: DERMATOLOGY

## 2010-01-27 ENCOUNTER — Other Ambulatory Visit
Admission: RE | Admit: 2010-01-27 | Discharge: 2010-01-27 | Disposition: A | Discharge status: Home / Self Care | Payer: No Typology Code available for payment source | Attending: DERMATOLOGY | Admitting: DERMATOLOGY

## 2010-01-27 ENCOUNTER — Ambulatory Visit (INDEPENDENT_AMBULATORY_CARE_PROVIDER_SITE_OTHER): Payer: No Typology Code available for payment source | Admitting: DERMATOLOGY

## 2010-01-27 DIAGNOSIS — L989 Disorder of the skin and subcutaneous tissue, unspecified: Secondary | ICD-10-CM | POA: Insufficient documentation

## 2010-01-27 MED ORDER — DOXYCYCLINE MONOHYDRATE 100 MG CAPSULE
100.0000 mg | ORAL_CAPSULE | Freq: Two times a day (BID) | ORAL | Status: DC
Start: 2010-01-27 — End: 2010-07-22

## 2010-01-27 MED ORDER — AZELAIC ACID 20 % TOPICAL CREAM
TOPICAL_CREAM | Freq: Two times a day (BID) | CUTANEOUS | Status: DC
Start: 2010-01-27 — End: 2011-02-23

## 2010-01-27 NOTE — Procedures (Signed)
See progress note.   Beverly Blair L [name], MD

## 2010-01-27 NOTE — Progress Notes (Signed)
Subjective:       Patient ID: Beverly Blair is a 69 y.o. female     Chief Complaint:   Chief Complaint   Patient presents with   . Skin Check     hx AK's and rosacea, check spot behind left ear and spot on left cheek,need mew rx for doxy90 day supply          HPI  New lesion behind left ear itches or feels irritated.  Brown plaque on left cheek growing  No rash or itching  No fever or chills  No skin lesions bleeding or draining    Current outpatient prescriptions   Medication Sig   . Doxycycline Monohydrate (MONODOX) 100 mg Cap take 1 Cap by mouth Twice daily.   . Azelaic Acid 20 % Cream Apply  topically Twice daily.   Marland Kitchen Conj Estrog-Medroxyprogest Ace 0.625-2.5 mg Tab take 1 Tab by mouth Once a day.   . MULTI-VITAMIN PO take by mouth.    . DISCONTD: Doxycycline Monohydrate (MONODOX) 100 mg Cap take 1 Cap by mouth Twice daily.         Review of Systems   Constitutional: Negative for fever and chills.   Skin: Negative for rash and itching.       Objective:   .BP 118/66   Wt 63.231 kg (139 lb 6.4 oz)    Physical Exam   Constitutional: She is oriented to person, place, and time. She appears well-developed and well-nourished. No distress.   HENT:   Head: Normocephalic and atraumatic.   Neurological: She is alert and oriented to person, place, and time.   Skin: Skin is warm and dry. She is not diaphoretic.          Psychiatric: She has a normal mood and affect.     General skin exam was performed including head, neck, anterior/posterior trunk, bilateral upper & lower extremities and revealed no areas of concern other than those documented.    Assessment & Plan:     1. Rosacea (695.3)     2. Skin lesion (709.9H)  PATH PENDING, CLINIC PROCEDURE (AMB ONLY), PATHOLOGY, INITIAL SURGICAL SPECIMEN   3. 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  Advised patient to re-apply sunscreen every 2-3 hours.  Advised the patient to avoid going to the tanning beds.  Advised to check skin routinely for any changes, especially any new moles or changes in existing moles.  Miles Costain, MD

## 2010-01-31 ENCOUNTER — Ambulatory Visit (HOSPITAL_BASED_OUTPATIENT_CLINIC_OR_DEPARTMENT_OTHER): Payer: Self-pay

## 2010-01-31 LAB — HISTORICAL SURGICAL PATHOLOGY SPECIMEN

## 2010-03-28 ENCOUNTER — Inpatient Hospital Stay (HOSPITAL_COMMUNITY): Payer: Self-pay

## 2010-03-28 ENCOUNTER — Encounter (HOSPITAL_BASED_OUTPATIENT_CLINIC_OR_DEPARTMENT_OTHER): Payer: No Typology Code available for payment source | Admitting: Obstetrics & Gynecology

## 2010-04-12 ENCOUNTER — Encounter (HOSPITAL_COMMUNITY): Admission: RE | Payer: Self-pay | Source: Ambulatory Visit

## 2010-04-12 ENCOUNTER — Inpatient Hospital Stay (HOSPITAL_COMMUNITY)
Admission: RE | Admit: 2010-04-12 | Payer: No Typology Code available for payment source | Source: Ambulatory Visit | Admitting: Obstetrics & Gynecology

## 2010-04-12 SURGERY — ROBOTIC HYSTERECTOMY
Anesthesia: General

## 2010-07-22 ENCOUNTER — Ambulatory Visit (INDEPENDENT_AMBULATORY_CARE_PROVIDER_SITE_OTHER): Payer: Self-pay | Admitting: DERMATOLOGY

## 2010-07-22 ENCOUNTER — Other Ambulatory Visit (INDEPENDENT_AMBULATORY_CARE_PROVIDER_SITE_OTHER): Payer: Self-pay | Admitting: DERMATOLOGY

## 2010-07-22 MED ORDER — DOXYCYCLINE MONOHYDRATE 100 MG CAPSULE
100.0000 mg | ORAL_CAPSULE | Freq: Two times a day (BID) | ORAL | Status: DC
Start: 2010-07-22 — End: 2011-02-23

## 2010-07-22 NOTE — Telephone Encounter (Signed)
Message copied by Dot Been on Fri Jul 22, 2010  1:44 PM  ------       Message from: Michel Bickers       Created: Fri Jul 22, 2010 11:33 AM         >> Michel Bickers 07/22/2010 11:33 AM       Dr Lorenso Courier,              Patient is requesting a refill of, Doxycycline Monohydrate (MONODOX) 100 mg Cap, to be called into  [id number] [place], [place] - [address]              Phone: [phone] Fax: [phone]

## 2010-08-11 ENCOUNTER — Ambulatory Visit (INDEPENDENT_AMBULATORY_CARE_PROVIDER_SITE_OTHER): Payer: No Typology Code available for payment source | Admitting: DERMATOLOGY

## 2011-02-23 ENCOUNTER — Encounter (INDEPENDENT_AMBULATORY_CARE_PROVIDER_SITE_OTHER): Payer: Self-pay | Admitting: DERMATOLOGY

## 2011-02-23 ENCOUNTER — Ambulatory Visit: Payer: No Typology Code available for payment source | Attending: DERMATOLOGY | Admitting: DERMATOLOGY

## 2011-02-23 DIAGNOSIS — L821 Other seborrheic keratosis: Secondary | ICD-10-CM

## 2011-02-23 DIAGNOSIS — L719 Rosacea, unspecified: Secondary | ICD-10-CM

## 2011-02-23 DIAGNOSIS — B079 Viral wart, unspecified: Secondary | ICD-10-CM

## 2011-02-23 MED ORDER — DOXYCYCLINE MONOHYDRATE 100 MG CAPSULE
100.0000 mg | ORAL_CAPSULE | Freq: Two times a day (BID) | ORAL | Status: DC
Start: 2011-02-23 — End: 2012-04-17

## 2011-02-23 MED ORDER — ESTRADIOL 0.5 MG TABLET
0.5000 mg | ORAL_TABLET | Freq: Every day | ORAL | Status: AC
Start: 2011-02-23 — End: ?

## 2011-02-23 NOTE — Procedures (Signed)
See progress note.   Beverly Denis L Eldred Sooy, MD

## 2011-02-23 NOTE — Progress Notes (Signed)
Subjective:       Patient ID: Beverly Blair is a 70 y.o. female     Chief Complaint:     Chief Complaint   Patient presents with   . Skin Check     1-year checkup. Check lesion on chest. Hx of ak, sk, rosacea.        HPI  New lesion mid chest.  Itched and bled when scratched at night.  Multiple keratoic lesions chest and arms that itch.    No change in moles.  No hx of skin cancer but hx of AK and tanning bed use.  Now careful in the sun.  No rash    Current Outpatient Prescriptions   Medication Sig   . Estradiol (ESTRACE) 0.5 mg Oral Tablet take 1 Tab by mouth Once a day.   . Doxycycline Monohydrate (MONODOX) 100 mg Oral Capsule take 1 Cap by mouth Twice daily.   . MULTI-VITAMIN PO take by mouth.    . DISCONTD: Doxycycline Monohydrate (MONODOX) 100 mg Oral Capsule take 1 Cap by mouth Twice daily.   Marland Kitchen DISCONTD: Azelaic Acid 20 % Cream Apply  topically Twice daily.   Marland Kitchen DISCONTD: Conj Estrog-Medroxyprogest Ace 0.625-2.5 mg Tab take 1 Tab by mouth Once a day.       Review of Systems   Constitutional: Negative for fever and chills.   Cardiovascular: Negative for chest pain.   Skin: Positive for itching. Negative for rash.       Objective:   .BP 122/70   Wt 65.227 kg (143 lb 12.8 oz)    Physical Exam   Constitutional: She is oriented to person, place, and time. She appears well-developed and well-nourished. No distress.   HENT:   Head: Normocephalic and atraumatic.   Neurological: She is alert and oriented to person, place, and time.   Skin: Skin is warm and dry. She is not diaphoretic.        Psychiatric: She has a normal mood and affect.   General skin exam was performed including head, neck, anterior/posterior trunk, bilateral upper & lower extremities and revealed no areas of concern other than those documented.      Assessment & Plan:     1. Wart (078.10)  WART REMOVAL (AMB ONLY)   2. Seborrheic keratosis (702.19)     3. Rosacea (695.3)           Plan:  Freeze thaw freeze wart mid chest and left chest   Procedure:  Lesion(s) treated with freeze/thaw/refreeze with liquid nitrogen.  Patient was informed of the risks of the procedure including pain, irritation, blister, hypopigmentation, hyperpigmentation, and recurrence. Patient understood the risks and agrees with the procedure.    The patient was educated on the importance of avoiding excessive sun exposure and wearing sunscreen daily.  Advised patient to re-apply sunscreen every 2-3 hours.  Advised the patient to avoid going to the tanning beds.  Advised to check skin routinely for any changes, especially any new moles or changes in existing moles.  Miles Costain, MD

## 2012-04-17 ENCOUNTER — Encounter (INDEPENDENT_AMBULATORY_CARE_PROVIDER_SITE_OTHER): Payer: Self-pay | Admitting: DERMATOLOGY

## 2012-04-17 ENCOUNTER — Ambulatory Visit: Payer: No Typology Code available for payment source | Attending: DERMATOLOGY | Admitting: DERMATOLOGY

## 2012-04-17 VITALS — BP 130/68 | Ht 62.21 in | Wt 143.1 lb

## 2012-04-17 DIAGNOSIS — L821 Other seborrheic keratosis: Secondary | ICD-10-CM | POA: Insufficient documentation

## 2012-04-17 MED ORDER — DOXYCYCLINE MONOHYDRATE 100 MG CAPSULE
100.0000 mg | ORAL_CAPSULE | Freq: Two times a day (BID) | ORAL | Status: DC
Start: 2012-04-17 — End: 2015-05-24

## 2012-04-17 NOTE — Procedures (Signed)
See progress note.   Beverly Heuberger L Treyvon Blahut, MD

## 2012-04-17 NOTE — Progress Notes (Signed)
Subjective:       Patient ID: Beverly Blair is a 71 y.o. female     Chief Complaint:     Chief Complaint   Patient presents with   . Skin Check     59-month f/u. HX of ak, sk, rosacea.        HPI  Doing well.  Has noted new tender area right cheek that is red.  Hx of actinic keratoses in past  Past extensive sun exposure, careful now  No personal hx of skin cancer  Rosacea flares occas, takes short course of doxy and clears    Current Outpatient Prescriptions   Medication Sig   . Doxycycline Monohydrate (MONODOX) 100 mg Oral Capsule Take 1 Cap (100 mg total) by mouth Twice daily   . Estradiol (ESTRACE) 0.5 mg Oral Tablet take 1 Tab by mouth Once a day.   Marland Kitchen LEVOTHYROXINE SODIUM (SYNTHROID ORAL) Take 30 mcg by mouth Once a day   . MULTI-VITAMIN PO take by mouth.    . [DISCONTINUED] Doxycycline Monohydrate (MONODOX) 100 mg Oral Capsule take 1 Cap by mouth Twice daily.     No current facility-administered medications for this visit.       Review of Systems   Constitutional: Negative for fever and chills.   Cardiovascular: Negative for chest pain.   Skin: Negative for itching and rash.       Objective:   . BP 130/68   Ht 1.58 m (5' 2.21")   Wt 64.9 kg (143 lb 1.3 oz)   BMI 26 kg/m2    Physical Exam   Constitutional: She is oriented to person, place, and time. She appears well-developed and well-nourished. No distress.   HENT:   Head: Normocephalic and atraumatic.   Neurological: She is alert and oriented to person, place, and time.   Skin: Skin is warm and dry. She is not diaphoretic.        Psychiatric: She has a normal mood and affect.   General skin exam was performed including head, neck, anterior/posterior trunk, bilateral upper & lower extremities and revealed no areas of concern other than those documented.      Assessment & Plan:     1. 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 Rosacea (695.3)     3. Seborrheic keratosis (702.19)      4. Menopause LMP 2004 (627.2)             Plan:    Procedure:  Freeze thaw freeze AK right cheek Lesion(s) treated with freeze/thaw/refreeze with liquid nitrogen.  Patient was informed of the risks of the procedure including pain, irritation, blister, hypopigmentation, hyperpigmentation, and recurrence. Patient understood the risks and agrees with the procedure.    The patient was educated on the importance of avoiding excessive sun exposure and wearing sunscreen daily.  Advised patient to re-apply sunscreen every 2-3 hours.  Advised the patient to avoid going to the tanning beds.  Advised to check skin routinely for any changes, especially any new moles or changes in existing moles.  Miles Costain, MD

## 2012-10-21 ENCOUNTER — Encounter (INDEPENDENT_AMBULATORY_CARE_PROVIDER_SITE_OTHER): Payer: No Typology Code available for payment source | Admitting: DERMATOLOGY

## 2013-03-10 ENCOUNTER — Encounter (INDEPENDENT_AMBULATORY_CARE_PROVIDER_SITE_OTHER): Payer: Self-pay | Admitting: DERMATOLOGY

## 2013-03-10 ENCOUNTER — Ambulatory Visit: Payer: No Typology Code available for payment source | Attending: DERMATOLOGY | Admitting: DERMATOLOGY

## 2013-03-10 ENCOUNTER — Other Ambulatory Visit (INDEPENDENT_AMBULATORY_CARE_PROVIDER_SITE_OTHER): Payer: Self-pay | Admitting: DERMATOLOGY

## 2013-03-10 VITALS — BP 130/70 | Ht 63.07 in | Wt 139.6 lb

## 2013-03-10 DIAGNOSIS — L570 Actinic keratosis: Secondary | ICD-10-CM | POA: Insufficient documentation

## 2013-03-10 NOTE — Addendum Note (Signed)
Addended by: Robin Searing on: 03/10/2013 12:49 PM     Modules accepted: Orders

## 2013-03-10 NOTE — Procedures (Signed)
See progress note.   Beverly Fludd L Gabryela Kimbrell, MD

## 2013-03-10 NOTE — Patient Instructions (Signed)
Non-Sutured Wound Care (i.e. Shave Biopsy)      The bandage applied in clinic should NOT be removed for 24 hours.  Some blood tinged oozing might occur.  If your bandage becomes saturated, you should apply another bandage on top of the existing bandage.  After 24 hours, begin Daily Wound Care.      Sutured Wound Care (i.e. Excision or punch biopsy)      After surgery, go home and rest (no exertion, straining, or lifting more than 5 pounds.)  Apply ice pack to the area every two hours for 10 minutes (while you are awake) for the first 48 hours.  Leave the dressing on for at least 48 hours.  DO NOT GET THE DRESSING WET.  If the dressing is soiled by drainage from the wound, you may apply another bandage over it.  After 48 hours, you may remove the dressing carefully, UNLESS ADVISED OTHERWISE.  At this point, begin Daily Wound Care.  Continue daily wound care until you return to the office for suture removal.      Daily Wound Care    1. Remove Dressing  2. Clean with soap and water using a Q-tip or sterile gauze pad.  Do not aggressively scrub the wound.  3. Dry with a Q-tip, sterile gauze pad, or air dry.  4. Apply white petrolatum jelly (such as Vaseline™) sparingly over the entire wound.  5. Cover the wound with a bandage.  If you are sensitive to adhesive, cover the wound with a non-stick pad (such as Telfa™) followed by paper tape.  6. REPEAT STEPS 1-5 of Daily Wound Care each day until the wound has completely healed (usually 2-4 weeks depending on depth of biopsy and inherent healing properties.)        Do not leave wound open to air.  This causes delayed healing and worsened scarring.    Do not apply anti-microbial ointments (Neosporin, Polysporin, Bacitracin, etc.); these provide little additional benefit compared to white petroleum jelly, and may lead to an allergic response.    NO SMOKING after surgery!  Smoking will delay the healing process.    Do not drink any alcoholic beverages.  Consumption of  alcohol increases bleeding risk.      Do not take any aspirin products for 24 hours, unless directed to take aspirin products by a physician.  You may take Tylenol or any other aspirin-free product for pain.      Biopsy results are available in 14-21 days in most cases.

## 2013-03-10 NOTE — Progress Notes (Signed)
 Subjective:       Patient ID: Beverly Blair is a 72 y.o. female     Chief Complaint:     Chief Complaint   Patient presents with   . Skin Check     c/o lesion on face scabbing over        HPI  Here for lesion right cheek that does not go away with freezeing.  Slightly tender.  Crusts.  occas bleeds when washes face.  Hx AKs.  No hx skin cancer.  Careful in the sun now  Only wants this taken care of today    Current Outpatient Prescriptions   Medication Sig   . Doxycycline  Monohydrate (MONODOX ) 100 mg Oral Capsule Take 1 Cap (100 mg total) by mouth Twice daily   . Estradiol  (ESTRACE ) 0.5 mg Oral Tablet take 1 Tab by mouth Once a day.   SABRA LEVOTHYROXINE SODIUM (SYNTHROID ORAL) Take 30 mcg by mouth Once a day   . MULTI-VITAMIN PO take by mouth.        Review of Systems   Constitutional: Negative for fever and chills.   Cardiovascular: Negative for chest pain.   Skin: Negative for itching and rash.       Objective:   . BP 130/70  Ht 1.602 m (5' 3.07)  Wt 63.3 kg (139 lb 8.8 oz)  BMI 24.66 kg/m2    Physical Exam   Constitutional: She is oriented to person, place, and time. She appears well-developed and well-nourished. No distress.   HENT:   Head: Normocephalic and atraumatic.   Neurological: She is alert and oriented to person, place, and time.   Skin: Skin is warm and dry. She is not diaphoretic.        Psychiatric: She has a normal mood and affect.       Assessment & Plan:       ICD-9-CM    1. Skin lesion 709.9 PATH PENDING, CLINIC PROCEDURE (AMB ONLY)     PATHOLOGY, INITIAL SURGICAL SPECIMEN   2. Actinic keratosis 702.0                Plan;  Shave biopsy: lesion right cheek:  AK vs BCc  Consent signed  Time out at 11:53 to ID correct location  Cleaned  Anesthetized  Shave biopsy to path  Bleeding controlled  Wound care provided and instructed      The patient was educated on the importance of avoiding excessive sun exposure and wearing sunscreen daily.  Advised patient to re-apply sunscreen every 2-3  hours.  Advised the patient to avoid going to the tanning beds.  Advised to check skin routinely for any changes, especially any new moles or changes in existing moles.  Guinevere LITTIE Breeding, MD

## 2013-03-11 LAB — HISTORICAL SURGICAL PATHOLOGY SPECIMEN

## 2013-04-05 ENCOUNTER — Other Ambulatory Visit: Payer: Self-pay

## 2013-04-07 ENCOUNTER — Other Ambulatory Visit: Payer: Self-pay

## 2013-04-28 ENCOUNTER — Encounter (INDEPENDENT_AMBULATORY_CARE_PROVIDER_SITE_OTHER): Payer: No Typology Code available for payment source | Admitting: DERMATOLOGY

## 2013-06-18 ENCOUNTER — Encounter (INDEPENDENT_AMBULATORY_CARE_PROVIDER_SITE_OTHER): Payer: No Typology Code available for payment source | Admitting: DERMATOLOGY

## 2013-10-10 ENCOUNTER — Encounter (INDEPENDENT_AMBULATORY_CARE_PROVIDER_SITE_OTHER): Payer: Self-pay | Admitting: DERMATOLOGY

## 2013-12-30 ENCOUNTER — Other Ambulatory Visit (HOSPITAL_COMMUNITY): Payer: Self-pay

## 2013-12-30 DIAGNOSIS — Z1231 Encounter for screening mammogram for malignant neoplasm of breast: Secondary | ICD-10-CM

## 2014-03-13 ENCOUNTER — Other Ambulatory Visit: Payer: Self-pay

## 2015-03-20 ENCOUNTER — Other Ambulatory Visit: Payer: Self-pay

## 2015-04-07 ENCOUNTER — Other Ambulatory Visit (HOSPITAL_COMMUNITY): Payer: Self-pay

## 2015-04-07 DIAGNOSIS — Z1231 Encounter for screening mammogram for malignant neoplasm of breast: Secondary | ICD-10-CM

## 2015-05-24 ENCOUNTER — Encounter (HOSPITAL_BASED_OUTPATIENT_CLINIC_OR_DEPARTMENT_OTHER): Payer: Self-pay | Admitting: DERMATOLOGY

## 2015-05-24 ENCOUNTER — Ambulatory Visit: Payer: No Typology Code available for payment source | Attending: DERMATOLOGY | Admitting: DERMATOLOGY

## 2015-05-24 VITALS — BP 134/61 | Ht 62.52 in | Wt 136.0 lb

## 2015-05-24 DIAGNOSIS — L821 Other seborrheic keratosis: Secondary | ICD-10-CM | POA: Insufficient documentation

## 2015-05-24 DIAGNOSIS — L570 Actinic keratosis: Secondary | ICD-10-CM | POA: Insufficient documentation

## 2015-05-24 DIAGNOSIS — Z872 Personal history of diseases of the skin and subcutaneous tissue: Secondary | ICD-10-CM | POA: Insufficient documentation
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
## 2015-05-24 NOTE — Procedures (Signed)
See progress note.   Tiajah Oyster L Evelio Rueda, MD

## 2015-11-22 ENCOUNTER — Encounter (HOSPITAL_BASED_OUTPATIENT_CLINIC_OR_DEPARTMENT_OTHER): Payer: Medicare Other | Admitting: DERMATOLOGY

## 2016-03-25 ENCOUNTER — Other Ambulatory Visit: Payer: Self-pay

## 2016-04-12 ENCOUNTER — Other Ambulatory Visit (HOSPITAL_COMMUNITY): Payer: Self-pay

## 2016-04-12 DIAGNOSIS — Z1231 Encounter for screening mammogram for malignant neoplasm of breast: Secondary | ICD-10-CM

## 2016-05-08 ENCOUNTER — Ambulatory Visit (HOSPITAL_COMMUNITY): Payer: Self-pay

## 2016-12-16 ENCOUNTER — Emergency Department (HOSPITAL_BASED_OUTPATIENT_CLINIC_OR_DEPARTMENT_OTHER): Payer: Medicare PPO

## 2016-12-16 ENCOUNTER — Inpatient Hospital Stay (HOSPITAL_BASED_OUTPATIENT_CLINIC_OR_DEPARTMENT_OTHER)
Admission: EM | Admit: 2016-12-16 | Discharge: 2016-12-19 | DRG: 066 | Disposition: A | Discharge status: Home / Self Care | Payer: Medicare PPO | Attending: Family Medicine | Admitting: Family Medicine

## 2016-12-16 ENCOUNTER — Encounter (HOSPITAL_BASED_OUTPATIENT_CLINIC_OR_DEPARTMENT_OTHER): Payer: Self-pay | Admitting: Emergency Medicine

## 2016-12-16 DIAGNOSIS — R51 Headache: Secondary | ICD-10-CM | POA: Diagnosis not present

## 2016-12-16 DIAGNOSIS — I639 Cerebral infarction, unspecified: Secondary | ICD-10-CM | POA: Diagnosis not present

## 2016-12-16 DIAGNOSIS — R402142 Coma scale, eyes open, spontaneous, at arrival to emergency department: Secondary | ICD-10-CM | POA: Diagnosis present

## 2016-12-16 DIAGNOSIS — R402362 Coma scale, best motor response, obeys commands, at arrival to emergency department: Secondary | ICD-10-CM | POA: Diagnosis present

## 2016-12-16 DIAGNOSIS — R410 Disorientation, unspecified: Secondary | ICD-10-CM | POA: Diagnosis not present

## 2016-12-16 DIAGNOSIS — E039 Hypothyroidism, unspecified: Secondary | ICD-10-CM | POA: Diagnosis not present

## 2016-12-16 DIAGNOSIS — R4781 Slurred speech: Secondary | ICD-10-CM | POA: Diagnosis present

## 2016-12-16 DIAGNOSIS — Z90710 Acquired absence of both cervix and uterus: Secondary | ICD-10-CM

## 2016-12-16 DIAGNOSIS — R29810 Facial weakness: Secondary | ICD-10-CM | POA: Diagnosis present

## 2016-12-16 DIAGNOSIS — Z79899 Other long term (current) drug therapy: Secondary | ICD-10-CM

## 2016-12-16 DIAGNOSIS — R29702 NIHSS score 2: Secondary | ICD-10-CM | POA: Diagnosis present

## 2016-12-16 DIAGNOSIS — R519 Headache, unspecified: Secondary | ICD-10-CM | POA: Diagnosis present

## 2016-12-16 DIAGNOSIS — R402242 Coma scale, best verbal response, confused conversation, at arrival to emergency department: Secondary | ICD-10-CM | POA: Diagnosis present

## 2016-12-16 DIAGNOSIS — G3184 Mild cognitive impairment, so stated: Secondary | ICD-10-CM | POA: Diagnosis present

## 2016-12-16 HISTORY — DX: Disorder of thyroid, unspecified: E07.9

## 2016-12-16 HISTORY — DX: Unspecified injury of head, initial encounter: S09.90XA

## 2016-12-16 LAB — URINALYSIS, ROUTINE W REFLEX MICROSCOPIC
Bilirubin Urine: NEGATIVE
Glucose, UA: NEGATIVE mg/dL
Hgb urine dipstick: NEGATIVE
KETONES UR: NEGATIVE mg/dL
LEUKOCYTES UA: NEGATIVE
NITRITE: NEGATIVE
PH: 7.5 (ref 5.0–8.0)
PROTEIN: NEGATIVE mg/dL
Specific Gravity, Urine: 1.007 (ref 1.005–1.030)

## 2016-12-16 LAB — CBC WITH DIFFERENTIAL/PLATELET
BASOS ABS: 0 10*3/uL (ref 0.0–0.1)
BASOS PCT: 0 %
Eosinophils Absolute: 0 10*3/uL (ref 0.0–0.7)
Eosinophils Relative: 1 %
HEMATOCRIT: 39.5 % (ref 36.0–46.0)
Hemoglobin: 13.5 g/dL (ref 12.0–15.0)
Lymphocytes Relative: 39 %
Lymphs Abs: 2.3 10*3/uL (ref 0.7–4.0)
MCH: 32.5 pg (ref 26.0–34.0)
MCHC: 34.2 g/dL (ref 30.0–36.0)
MCV: 95.2 fL (ref 78.0–100.0)
MONO ABS: 0.5 10*3/uL (ref 0.1–1.0)
Monocytes Relative: 9 %
NEUTROS ABS: 3 10*3/uL (ref 1.7–7.7)
NEUTROS PCT: 51 %
Platelets: 165 10*3/uL (ref 150–400)
RBC: 4.15 MIL/uL (ref 3.87–5.11)
RDW: 12.8 % (ref 11.5–15.5)
WBC: 5.9 10*3/uL (ref 4.0–10.5)

## 2016-12-16 LAB — COMPREHENSIVE METABOLIC PANEL
ALK PHOS: 39 U/L (ref 38–126)
ALT: 16 U/L (ref 14–54)
AST: 24 U/L (ref 15–41)
Albumin: 3.9 g/dL (ref 3.5–5.0)
Anion gap: 10 (ref 5–15)
BILIRUBIN TOTAL: 0.6 mg/dL (ref 0.3–1.2)
BUN: 14 mg/dL (ref 6–20)
CALCIUM: 9.2 mg/dL (ref 8.9–10.3)
CO2: 26 mmol/L (ref 22–32)
Chloride: 103 mmol/L (ref 101–111)
Creatinine, Ser: 0.68 mg/dL (ref 0.44–1.00)
Glucose, Bld: 94 mg/dL (ref 65–99)
POTASSIUM: 3.6 mmol/L (ref 3.5–5.1)
Sodium: 139 mmol/L (ref 135–145)
TOTAL PROTEIN: 6.8 g/dL (ref 6.5–8.1)

## 2016-12-16 LAB — I-STAT VENOUS BLOOD GAS, ED
Acid-Base Excess: 4 mmol/L — ABNORMAL HIGH (ref 0.0–2.0)
Bicarbonate: 28.5 mmol/L — ABNORMAL HIGH (ref 20.0–28.0)
O2 SAT: 41 %
PH VEN: 7.439 — AB (ref 7.250–7.430)
TCO2: 30 mmol/L (ref 0–100)
pCO2, Ven: 42 mmHg — ABNORMAL LOW (ref 44.0–60.0)
pO2, Ven: 23 mmHg — CL (ref 32.0–45.0)

## 2016-12-16 LAB — AMMONIA: Ammonia: 13 umol/L (ref 9–35)

## 2016-12-16 LAB — ETHANOL

## 2016-12-16 MED ORDER — ASPIRIN 81 MG PO CHEW
324.0000 mg | CHEWABLE_TABLET | Freq: Once | ORAL | Status: DC
  Filled 2016-12-16: qty 4

## 2016-12-16 MED ORDER — ACETAMINOPHEN 500 MG PO TABS
1000.0000 mg | ORAL_TABLET | Freq: Once | ORAL | Status: AC
  Administered 2016-12-16: 1000 mg via ORAL
  Filled 2016-12-16: qty 2

## 2016-12-16 MED ORDER — DIPHENHYDRAMINE HCL 50 MG/ML IJ SOLN
12.5000 mg | Freq: Once | INTRAMUSCULAR | Status: AC
  Administered 2016-12-16: 12.5 mg via INTRAVENOUS
  Filled 2016-12-16: qty 1

## 2016-12-16 MED ORDER — LORAZEPAM 2 MG/ML IJ SOLN
1.0000 mg | Freq: Once | INTRAMUSCULAR | Status: AC
  Administered 2016-12-16: 1 mg via INTRAVENOUS
  Filled 2016-12-16: qty 1

## 2016-12-16 MED ORDER — MORPHINE SULFATE (PF) 2 MG/ML IV SOLN
2.0000 mg | Freq: Once | INTRAVENOUS | Status: AC
  Administered 2016-12-16: 2 mg via INTRAVENOUS
  Filled 2016-12-16: qty 1

## 2016-12-16 MED ORDER — PROCHLORPERAZINE EDISYLATE 5 MG/ML IJ SOLN
5.0000 mg | Freq: Once | INTRAMUSCULAR | Status: AC
  Administered 2016-12-16: 5 mg via INTRAVENOUS
  Filled 2016-12-16: qty 2
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
## 2016-12-16 NOTE — ED Notes (Signed)
Pt on auto VS and cardiac monitor. 

## 2016-12-16 NOTE — ED Notes (Signed)
EDP into room for transfer eval

## 2016-12-16 NOTE — ED Notes (Signed)
Patient transported to MRI 

## 2016-12-16 NOTE — ED Triage Notes (Signed)
Pt reports headache and confusion x 2 days. Pt lives alone and states she could not remember how to tie her shoes or balance her check book. Pt daughter states she talked to her yesterday and she seemed ok. She states pt has not been acting right today since she has been with her and has had slurred speech.

## 2016-12-16 NOTE — ED Notes (Signed)
No changes, resting comfortably, arousable to voice, sleepy (sedated earlier) NAD, calm, interactive, resps e/u, no dyspnea noted. Updated with wait, plan process, pending inpt bed assignment, denies questions or needs, VSS. Mouth/ gums/ tongue swabbed with cold wet swab.

## 2016-12-16 NOTE — ED Notes (Signed)
Daughter at [facility], no changes.

## 2016-12-16 NOTE — ED Notes (Signed)
Pt on auto VS and continuous cardiac monitoring.

## 2016-12-16 NOTE — ED Provider Notes (Signed)
I discussed presentation and MRI with Dr. Aram Beecham of neuro. given frontal infarct could cause confusion and other odd sign/symptoms, he recommends admission to the hospitalist for stroke workup including possible causes. I discussed with Dr. Hal Hope, however he states the patient is to go to [facility] of [facility] given the need for neurology specialization and possible stroke workup. D/w Dr. Loleta Books, who accepts to [facility] for tele/obs admission   Sherwood Gambler, MD 12/16/16 [phone]

## 2016-12-16 NOTE — ED Notes (Signed)
Attempted report to [facility]

## 2016-12-16 NOTE — ED Notes (Signed)
Pt ambulatory with stand-by assistance only, in NAD.

## 2016-12-16 NOTE — ED Notes (Addendum)
Pt updated, denies questions or needs, back to sleep, NAD, calm, no dyspnea noted, skin W&D, NSR on monitor, VSS, pending inpt bed assignment and [facility] transport to [facility].

## 2016-12-16 NOTE — Progress Notes (Signed)
76 yo F with hypothyroidism presents with worse than usual headache, plus confusion for 2 days.  BP (!) 155/64 (BP Location: Left Arm)   Pulse 66   Temp 98.1 F (36.7 C) (Oral)   Resp 16   SpO2 100%   Na 139, K 3.6, Cr 0.68, WBC 5.9, Hgb 13.5 Headache cocktail didn't help. MR brain showed subacute to chronic frontal infarction.  Couldn't get MRV.  Discussed with Dr. Armida Sans, Neuro who felt should get work up for secondary causes of stroke.  To [facility], tele.

## 2016-12-17 ENCOUNTER — Observation Stay (HOSPITAL_COMMUNITY): Payer: Medicare PPO

## 2016-12-17 ENCOUNTER — Encounter (HOSPITAL_COMMUNITY): Payer: Self-pay | Admitting: Family Medicine

## 2016-12-17 DIAGNOSIS — Z79899 Other long term (current) drug therapy: Secondary | ICD-10-CM | POA: Diagnosis not present

## 2016-12-17 DIAGNOSIS — I639 Cerebral infarction, unspecified: Principal | ICD-10-CM

## 2016-12-17 DIAGNOSIS — R402142 Coma scale, eyes open, spontaneous, at arrival to emergency department: Secondary | ICD-10-CM | POA: Diagnosis present

## 2016-12-17 DIAGNOSIS — R51 Headache: Secondary | ICD-10-CM

## 2016-12-17 DIAGNOSIS — R29702 NIHSS score 2: Secondary | ICD-10-CM | POA: Diagnosis present

## 2016-12-17 DIAGNOSIS — R519 Headache, unspecified: Secondary | ICD-10-CM | POA: Diagnosis present

## 2016-12-17 DIAGNOSIS — R402362 Coma scale, best motor response, obeys commands, at arrival to emergency department: Secondary | ICD-10-CM | POA: Diagnosis present

## 2016-12-17 DIAGNOSIS — R4781 Slurred speech: Secondary | ICD-10-CM | POA: Diagnosis present

## 2016-12-17 DIAGNOSIS — E039 Hypothyroidism, unspecified: Secondary | ICD-10-CM | POA: Diagnosis present

## 2016-12-17 DIAGNOSIS — Z90710 Acquired absence of both cervix and uterus: Secondary | ICD-10-CM | POA: Diagnosis not present

## 2016-12-17 DIAGNOSIS — R29810 Facial weakness: Secondary | ICD-10-CM | POA: Diagnosis present

## 2016-12-17 DIAGNOSIS — G3184 Mild cognitive impairment, so stated: Secondary | ICD-10-CM | POA: Diagnosis present

## 2016-12-17 DIAGNOSIS — R410 Disorientation, unspecified: Secondary | ICD-10-CM | POA: Diagnosis present

## 2016-12-17 DIAGNOSIS — G9340 Encephalopathy, unspecified: Secondary | ICD-10-CM | POA: Diagnosis not present

## 2016-12-17 DIAGNOSIS — R402242 Coma scale, best verbal response, confused conversation, at arrival to emergency department: Secondary | ICD-10-CM | POA: Diagnosis present

## 2016-12-17 LAB — ECHOCARDIOGRAM COMPLETE
HEIGHTINCHES: 62 in
WEIGHTICAEL: 2176 [oz_av]

## 2016-12-17 LAB — LIPID PANEL
CHOLESTEROL: 181 mg/dL (ref 0–200)
HDL: 88 mg/dL (ref >40)
LDL Cholesterol: 77 mg/dL (ref 0–99)
Total CHOL/HDL Ratio: 2.1 RATIO
Triglycerides: 80 mg/dL (ref <150)
VLDL: 16 mg/dL (ref 0–40)

## 2016-12-17 LAB — TSH: TSH: 6.945 u[IU]/mL — AB (ref 0.350–4.500)

## 2016-12-17 MED ORDER — ENOXAPARIN SODIUM 40 MG/0.4ML ~~LOC~~ SOLN
40.0000 mg | Freq: Every day | SUBCUTANEOUS | Status: DC
  Administered 2016-12-17 – 2016-12-19 (×3): 40 mg via SUBCUTANEOUS
  Filled 2016-12-17 (×3): qty 0.4

## 2016-12-17 MED ORDER — SENNOSIDES-DOCUSATE SODIUM 8.6-50 MG PO TABS: 1.0000 | ORAL_TABLET | Freq: Every evening | ORAL | Status: DC | PRN

## 2016-12-17 MED ORDER — ACETAMINOPHEN 160 MG/5ML PO SOLN: 650.0000 mg | ORAL | Status: DC | PRN

## 2016-12-17 MED ORDER — ASPIRIN 300 MG RE SUPP: 300.0000 mg | Freq: Every day | RECTAL | Status: DC

## 2016-12-17 MED ORDER — ASPIRIN EC 81 MG PO TBEC
81.0000 mg | DELAYED_RELEASE_TABLET | Freq: Every day | ORAL | Status: DC
  Administered 2016-12-17 – 2016-12-19 (×3): 81 mg via ORAL
  Filled 2016-12-17 (×3): qty 1

## 2016-12-17 MED ORDER — ACETAMINOPHEN 325 MG PO TABS
650.0000 mg | ORAL_TABLET | ORAL | Status: DC | PRN
  Administered 2016-12-17 – 2016-12-19 (×3): 650 mg via ORAL
  Filled 2016-12-17 (×3): qty 2

## 2016-12-17 MED ORDER — STROKE: EARLY STAGES OF RECOVERY BOOK: Freq: Once | Status: DC

## 2016-12-17 MED ORDER — THYROID 30 MG PO TABS
30.0000 mg | ORAL_TABLET | Freq: Every day | ORAL | Status: DC
  Administered 2016-12-17 – 2016-12-19 (×3): 30 mg via ORAL
  Filled 2016-12-17 (×3): qty 1

## 2016-12-17 MED ORDER — LORAZEPAM 2 MG/ML IJ SOLN
1.0000 mg | Freq: Once | INTRAMUSCULAR | Status: AC
  Administered 2016-12-17: 1 mg via INTRAVENOUS

## 2016-12-17 MED ORDER — ASPIRIN 325 MG PO TABS: 325.0000 mg | ORAL_TABLET | Freq: Every day | ORAL | Status: DC

## 2016-12-17 MED ORDER — ACETAMINOPHEN 650 MG RE SUPP: 650.0000 mg | RECTAL | Status: DC | PRN

## 2016-12-17 MED ORDER — METOCLOPRAMIDE HCL 5 MG/ML IJ SOLN
10.0000 mg | Freq: Once | INTRAMUSCULAR | Status: AC
  Administered 2016-12-17: 10 mg via INTRAVENOUS
  Filled 2016-12-17: qty 2

## 2016-12-17 MED ORDER — LORAZEPAM 2 MG/ML IJ SOLN
INTRAMUSCULAR | Status: AC
  Filled 2016-12-17: qty 1

## 2016-12-17 MED ORDER — SODIUM CHLORIDE 0.9 % IV SOLN: INTRAVENOUS | Status: AC

## 2016-12-17 MED ORDER — LEVOTHYROXINE SODIUM 25 MCG PO TABS: 25.0000 ug | ORAL_TABLET | Freq: Every day | ORAL | Status: DC

## 2016-12-17 MED ORDER — ATORVASTATIN CALCIUM 40 MG PO TABS
40.0000 mg | ORAL_TABLET | Freq: Every day | ORAL | Status: DC
  Administered 2016-12-17 – 2016-12-19 (×3): 40 mg via ORAL
  Filled 2016-12-17 (×3): qty 1

## 2016-12-17 NOTE — H&P (Signed)
History and Physical    November Janice [name]:[id number] DOB: 1941-03-12 DOA: 12/16/2016  PCP: Janice [name], No Pcp Per   Janice [name] coming from: Home, by way of [facility] ED   Chief Complaint: Headache, confusion   HPI: Janice [name] is a 76 y.o. female with medical history significant for hypothyroidism and remote head injury, presented to the emergency department for evaluation of severe frontal headache and confusion. The Janice [name] is accompanied by her daughter who assists with the history. Janice [name] lives alone and suffers from chronic headaches, typically frontal, but began to complain of a more severe than usual headache approximately 2 days ago. She otherwise seemed to be in her usual state when the daughter spoke with her by phone yesterday. Today, when the Janice [name]'s daughter visited, Janice [name] was found to be significantly confused, complaining of the severe frontal headache, and also complaining of blurred vision. She typically is fully independent with her ADLs, but could not even tie her shoes today due to the confusion. Headache is described as severe, constant, localized to the frontal region, and without alleviating or exacerbating factors identified. Janice [name] denied any focal numbness or weakness. Denies fevers or chills, dysuria or flank pain, chest pain or palpitations. Her daughter reports that there has been some very mild and fleeting episodes of confusion over the past 6 months or so, but never anything like this.  [facility] ED Course: Upon arrival to the ED, Janice [name] is found to be afebrile, saturating well on room air, and with vitals otherwise stable. EKG features a sinus rhythm and chest x-ray is negative for acute cardiopulmonary disease. Noncontrast head CT is negative for acute intracranial abnormality. Mr. panels unremarkable, ethanol level is undetectable, CBC is within the normal limits, and urinalysis appears normal. Janice [name] was treated with acetaminophen, Benadryl, morphine,  and Compazine in the ED, reported improvement and later resolution of her headache, but continued to be confused. 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 On-call neurologist was consulted by the ED physician and advised a medical admission to [facility] for further evaluation and management of this. Janice [name] remained hemodynamically stable and in no apparent respiratory distress and has been admitted to the telemetry unit at [facility] for further evaluation and management of severe headache and confusion, likely secondary to subacute left frontal white matter infarction.  Review of Systems:  All other systems reviewed and apart from HPI, are negative.  Past Medical History:  Diagnosis Date  . Head injury   . Thyroid disease     Past Surgical History:  Procedure Laterality Date  . ABDOMINAL HYSTERECTOMY    . APPENDECTOMY    . BRAIN SURGERY    . COLON SURGERY       reports that she has never smoked. She has never used smokeless tobacco. Her alcohol and drug histories are not on file.  No Known Allergies  History reviewed. No pertinent family history.   Prior to Admission medications   Medication Sig Start Date End Date Taking? Authorizing Provider  levothyroxine (SYNTHROID, LEVOTHROID) 25 MCG tablet Take 25 mcg by mouth daily before breakfast.   Yes [provider]    Physical Exam: Vitals:   12/16/16 2130 12/16/16 2200 12/16/16 2230 12/17/16 0030  BP: (!) 105/56 97/60 129/62 (!) 127/55  Pulse: 61 70 65 69  Resp: 12 13 14 18   Temp:    97.8 F (36.6 C)  TempSrc:    Oral  SpO2:  95% 96% 96% 96%  Weight:    61.7 kg (136 lb)  Height:    5\' 2"  (1.575 m)      Constitutional: NAD, calm, lethargic.  Eyes: PERTLA, lids and conjunctivae normal ENMT: Mucous membranes are moist. Posterior pharynx clear of any exudate or lesions.   Neck: normal, supple, no masses, no thyromegaly Respiratory:  clear to auscultation bilaterally, no wheezing, no crackles. Normal respiratory effort.   Cardiovascular: S1 & S2 heard, regular rate and rhythm. No extremity edema. No significant JVD. Abdomen: No distension, no tenderness, no masses palpated. Bowel sounds normal.  Musculoskeletal: no clubbing / cyanosis. No joint deformity upper and lower extremities.   Skin: no significant rashes, lesions, ulcers. Warm, dry, well-perfused. Neurologic: CN 2-12 grossly intact. Sensation intact, patellar DTR normal. Strength 5/5 in all 4 limbs.  Psychiatric: Alert and oriented to person, place, and situation, but not oriented to month or year. Lethargic. Cooperative.     Labs on Admission: I have personally reviewed following labs and imaging studies  CBC:  Recent Labs Lab 12/16/16 1452  WBC 5.9  NEUTROABS 3.0  HGB 13.5  HCT 39.5  MCV 95.2  PLT 130   Basic Metabolic Panel:  Recent Labs Lab 12/16/16 1452  NA 139  K 3.6  CL 103  CO2 26  GLUCOSE 94  BUN 14  CREATININE 0.68  CALCIUM 9.2   GFR: Estimated Creatinine Clearance: 51.7 mL/min (by C-G formula based on SCr of 0.68 mg/dL). Liver Function Tests:  Recent Labs Lab 12/16/16 1452  AST 24  ALT 16  ALKPHOS 39  BILITOT 0.6  PROT 6.8  ALBUMIN 3.9   No results for input(s): LIPASE, AMYLASE in the last 168 hours.  Recent Labs Lab 12/16/16 1452  AMMONIA 13   Coagulation Profile: No results for input(s): INR, PROTIME in the last 168 hours. Cardiac Enzymes: No results for input(s): CKTOTAL, CKMB, CKMBINDEX, TROPONINI in the last 168 hours. BNP (last 3 results) No results for input(s): PROBNP in the last 8760 hours. HbA1C: No results for input(s): HGBA1C in the last 72 hours. CBG: No results for input(s): GLUCAP in the last 168 hours. Lipid Profile: No results for input(s): CHOL, HDL, LDLCALC, TRIG, CHOLHDL, LDLDIRECT in the last 72 hours. Thyroid Function Tests: No results for input(s): TSH, T4TOTAL, FREET4, T3FREE,  THYROIDAB in the last 72 hours. Anemia Panel: No results for input(s): VITAMINB12, FOLATE, FERRITIN, TIBC, IRON, RETICCTPCT in the last 72 hours. Urine analysis:    Component Value Date/Time   COLORURINE YELLOW 12/16/2016 [place] 12/16/2016 1422   LABSPEC 1.007 12/16/2016 1422   PHURINE 7.5 12/16/2016 1422   GLUCOSEU NEGATIVE 12/16/2016 1422   HGBUR NEGATIVE 12/16/2016 1422   BILIRUBINUR NEGATIVE 12/16/2016 1422   KETONESUR NEGATIVE 12/16/2016 1422   PROTEINUR NEGATIVE 12/16/2016 1422   NITRITE NEGATIVE 12/16/2016 1422   LEUKOCYTESUR NEGATIVE 12/16/2016 1422   Sepsis Labs: @LABRCNTIP (procalcitonin:4,lacticidven:4) )No results found for this or any previous visit (from the past 240 hour(s)).   Radiological Exams on Admission: Dg Chest 2 View  Result Date: 12/16/2016 CLINICAL DATA:  Headache and confusion EXAM: CHEST  2 VIEW COMPARISON:  None. FINDINGS: Lungs are clear. Heart size and pulmonary vascularity are normal. No adenopathy. There is aortic atherosclerosis. There is degenerative change in the thoracic spine. IMPRESSION: Aortic atherosclerosis.  No edema or consolidation. Aortic Atherosclerosis (ICD10-I70.0). Electronically Signed   By: Lowella Grip III M.D.   On: 12/16/2016 15:43   Ct Head Wo Contrast  Result Date: 12/16/2016 CLINICAL DATA:  Frontal and occipital region headaches. Dizziness. Recent confusion. EXAM: CT HEAD WITHOUT CONTRAST TECHNIQUE: Contiguous axial images were obtained from the base of the skull through the vertex without intravenous contrast. COMPARISON:  None. FINDINGS: Brain: The ventricles and sulci appear normal in size and configuration for age. There is no evident intracranial mass, hemorrhage, extra-axial fluid collection, acute. A slightly asymmetric deep sulcus in the medial left temporal lobe is felt to represent an anatomic variant. No focal gray-white compartment lesions are evident. No acute infarct appreciable. Vascular: No  appreciable hyperdense vessel. There is mild calcification in the right carotid siphon region. Skull: Bony calvarium appears intact. Sinuses/Orbits: There is mild mucosal thickening in several ethmoid air cells. Other visualized paranasal sinuses are clear. Orbits appear symmetric bilaterally. Other: Mastoid air cells bilaterally are clear. IMPRESSION: No intracranial mass, hemorrhage, or extra-axial fluid collection. Gray-white compartments appear normal. There is slight right carotid siphon calcification. There is mild ethmoid sinus disease bilaterally. Electronically Signed   By: Lowella Grip III M.D.   On: 12/16/2016 15:43   Mr Brain Wo Contrast  Result Date: 12/16/2016 CLINICAL DATA:  Severe frontal headache, feeling unwell for 2 days. History of dementia, multiple head injuries. EXAM: MRI HEAD WITHOUT CONTRAST TECHNIQUE: Multiplanar, multiecho pulse sequences of the brain and surrounding structures were obtained without intravenous contrast. COMPARISON:  CT HEAD December 16, 2016 at 1530 hours FINDINGS: Mild motion degraded examination. BRAIN: 7 mm focus reduced diffusion LEFT frontal white matter with slight T2 shine through. Ventricles and sulci are normal for Janice [name]'s age. Additional minimal chronic small vessel ischemic disease, less than expected for age. 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 No susceptibility artifact to suggest hemorrhage. No midline shift, mass effect or masses. No susceptibility artifact to suggest hemorrhage. The ventricles and sulci are normal for Janice [name]'s age. No suspicious parenchymal signal, mass or mass effect. No abnormal extra-axial fluid collections. VASCULAR: Normal major intracranial vascular flow voids present at skull base. SKULL AND UPPER CERVICAL SPINE: No abnormal sellar expansion. No suspicious calvarial bone marrow signal. Craniocervical junction maintained. SINUSES/ORBITS: The mastoid air-cells and included paranasal sinuses are well-aerated. The included  ocular globes and orbital contents are non-suspicious. OTHER: None. IMPRESSION: 1. Mild motion degraded examination. 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 2. Otherwise negative noncontrast MRI of the head for age. Electronically Signed   By: Elon Alas M.D.   On: 12/16/2016 18:58    EKG: Independently reviewed. Normal sinus rhythm.   Assessment/Plan  1. Ischemic CVA left frontal lobe - Pt presents in transfer from [facility] with 2 days of headache and 1-2 days confusion  - MRI reveals a subacute, possibly old left frontal white-matter ischemic infarct   - Neurology is consulting and much appreciated  - Plan to monitor on telemetry with frequent neuro checks, PT/OT/SLP evals - Obtain MRA head, carotid dopplers, echocardiogram, A1c, and fasting lipid panel  - Start aspirin and Lipitor   2. Headache - Similar to her chronic headaches, but more severe  - Possibly secondary to #1  - Resolved with APAP, morphine, and compazine at [facility] ED   3. Confusion  - Suspected secondary to #1, workup as above    4. Hypothyroidism  - Check TSH given the confusion  - Continue current dose Synthroid for now    DVT prophylaxis: sq Lovenox Code Status: Full  Family Communication: Daughter updated at bedside Disposition Plan: Observe on telemetry Consults called: Neurology Admission status: Observation  Vianne Bulls, MD [facility] Pager [phone]  If 7PM-7AM, please contact night-coverage [email or web address] Password [email or web address]  12/17/2016, 1:31 AM

## 2016-12-17 NOTE — Progress Notes (Signed)
VASCULAR LAB PRELIMINARY  PRELIMINARY  PRELIMINARY  PRELIMINARY  Carotid duplex completed.    Preliminary report:  1-39% ICA plaquing.  Vertebral artery flow is antegrade.  Left ICA is tortuous in the mid portion, resulting in elevated velocities, however, plaque morphology does not support significant stenosis.   Sathvik Tiedt, RVT 12/17/2016, 11:20 AM

## 2016-12-17 NOTE — Consult Note (Signed)
Referring Physician: Dr Regenia Skeeter    Chief Complaint: confusion, slurred speech, visual changes, stroke on MRI brain  HPI:                                                                                                                                         Janice [name] is an 76 y.o. female with a past medical history significant for mild cognitive impairment, hypothyroidism, head trauma, and brain surgery, transferred from [facility] for further stroke evaluation. Patient daughter is at the bedside and said that Janice [name] is very functional and still capable of living independently despite of having some cognitive changes and frequent falls.  Stated that in the past two days her mother has been definitely more confused, complaining of HA, and today she also noticed slurred speech, some R face droop, and imbalance.  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 Presently, she offers no neurological complains. Serologies and UA are unimpressive.  Date last known well: 2-3 days ago Time last known well: unable to determine. tPA Given: no, out of the window NIHSS: 2 Janice: 0  Past Medical History:  Diagnosis Date  . Head injury   . Thyroid disease     Past Surgical History:  Procedure Laterality Date  . ABDOMINAL HYSTERECTOMY    . APPENDECTOMY    . BRAIN SURGERY    . COLON SURGERY      History reviewed. No pertinent family history. Social History:  reports that she has never smoked. She has never used smokeless tobacco. Her alcohol and drug histories are not on file.  Allergies: No Known Allergies  Medications:                                                                                                                           I have reviewed the patient's current medications. Prior to Admission:  Prescriptions Prior to  Admission  Medication Sig Dispense Refill Last Dose  . levothyroxine (SYNTHROID, LEVOTHROID) 25 MCG tablet Take 25 mcg by mouth daily before breakfast.       ROS:  History obtained from the patient and daughter  General ROS: negative for - chills, fatigue, fever, night sweats, weight gain or weight loss Psychological ROS: negative for - behavioral disorder, hallucinations, mood swings or suicidal ideation Ophthalmic ROS: negative for - blurry vision, double vision, eye pain or loss of vision ENT ROS: negative for - epistaxis, nasal discharge, oral lesions, sore throat, tinnitus or vertigo Allergy and Immunology ROS: negative for - hives or itchy/watery eyes Hematological and Lymphatic ROS: negative for - bleeding problems, bruising or swollen lymph nodes Endocrine ROS: negative for - galactorrhea, hair pattern changes, polydipsia/polyuria or temperature intolerance Respiratory ROS: negative for - cough, hemoptysis, shortness of breath or wheezing Cardiovascular ROS: negative for - chest pain, dyspnea on exertion, edema or irregular heartbeat Gastrointestinal ROS: negative for - abdominal pain, diarrhea, hematemesis, nausea/vomiting or stool incontinence Genito-Urinary ROS: negative for - dysuria, hematuria, incontinence or urinary frequency/urgency Musculoskeletal ROS: negative for - joint swelling or muscular weakness Neurological ROS: as noted in HPI Dermatological ROS: negative for rash and skin lesion changes   Physical exam:  Blood pressure (!) 127/55, pulse 69, temperature 97.8 F (36.6 C), temperature source Oral, resp. rate 18, height 5' 2"  (1.575 m), weight 61.7 kg (136 lb), SpO2 96 %. Constitutional: well developed, pleasant female in no apparent distress. Eyes: no jaundice or exophthalmos.  Head: normocephalic. Neck: supple, no bruits, no  JVD. Cardiac: no murmurs. Lungs: clear. Abdomen: soft, no tender, no mass. Extremities: no edema, clubbing, or cyanosis.  Skin: no rash  Neurologic Examination:                                                                                                      Mental Status: Alert, disoriented to year, thought content appropriate.  Speech fluent without evidence of aphasia.  Able to follow 3 step commands without difficulty. Cranial Nerves: II:  Visual fields grossly normal, pupils equal, round, reactive to light and accommodation III,IV, VI: ptosis not present, extra-ocular motions intact bilaterally V,VII: ? smile asymmetric due to R face droop, facial light touch sensation normal bilaterally VIII: hearing normal bilaterally IX,X: uvula rises symmetrically XI: bilateral shoulder shrug XII: midline tongue extension without atrophy or fasciculations Motor: Right : Upper extremity   5/5    Left:     Upper extremity   5/5  Lower extremity   5/5     Lower extremity   5/5 Tone and bulk:normal tone throughout; no atrophy noted Sensory: Pinprick and light touch intact throughout, bilaterally Deep Tendon Reflexes:  Right: Upper Extremity   Left: Upper extremity   biceps (C-5 to C-6) 2/4   biceps (C-5 to C-6) 2/4 tricep (C7) 2/4    triceps (C7) 2/4 Brachioradialis (C6) 2/4  Brachioradialis (C6) 2/4  Lower Extremity Lower Extremity  quadriceps (L-2 to L-4) 2/4   quadriceps (L-2 to L-4) 2/4 Achilles (S1) 2/4   Achilles (S1) 2/4  Plantars: Right: downgoing   Left: downgoing Cerebellar: normal finger-to-nose,  heel-to-shin no tested Gait:  No tested due to clinical condition, multiple leads      Results for  orders placed or performed during the hospital encounter of 12/16/16 (from the past 48 hour(s))  Urinalysis, Routine w reflex microscopic     Status: None   Collection Time: 12/16/16  2:22 PM  Result Value Ref Range   Color, Urine YELLOW YELLOW   APPearance CLEAR CLEAR    Specific Gravity, Urine 1.007 1.005 - 1.030   pH 7.5 5.0 - 8.0   Glucose, UA NEGATIVE NEGATIVE mg/dL   Hgb urine dipstick NEGATIVE NEGATIVE   Bilirubin Urine NEGATIVE NEGATIVE   Ketones, ur NEGATIVE NEGATIVE mg/dL   Protein, ur NEGATIVE NEGATIVE mg/dL   Nitrite NEGATIVE NEGATIVE   Leukocytes, UA NEGATIVE NEGATIVE    Comment: Microscopic not done on urines with negative protein, blood, leukocytes, nitrite, or glucose < 500 mg/dL.  Comprehensive metabolic panel     Status: None   Collection Time: 12/16/16  2:52 PM  Result Value Ref Range   Sodium 139 135 - 145 mmol/L   Potassium 3.6 3.5 - 5.1 mmol/L   Chloride 103 101 - 111 mmol/L   CO2 26 22 - 32 mmol/L   Glucose, Bld 94 65 - 99 mg/dL   BUN 14 6 - 20 mg/dL   Creatinine, Ser 0.68 0.44 - 1.00 mg/dL   Calcium 9.2 8.9 - 10.3 mg/dL   Total Protein 6.8 6.5 - 8.1 g/dL   Albumin 3.9 3.5 - 5.0 g/dL   AST 24 15 - 41 U/L   ALT 16 14 - 54 U/L   Alkaline Phosphatase 39 38 - 126 U/L   Total Bilirubin 0.6 0.3 - 1.2 mg/dL   GFR calc non Af Amer >60 >60 mL/min   GFR calc Af Amer >60 >60 mL/min    Comment: (NOTE) The eGFR has been calculated using the CKD EPI equation. This calculation has not been validated in all clinical situations. eGFR's persistently <60 mL/min signify possible Chronic Kidney Disease.    Anion gap 10 5 - 15  CBC WITH DIFFERENTIAL     Status: None   Collection Time: 12/16/16  2:52 PM  Result Value Ref Range   WBC 5.9 4.0 - 10.5 K/uL   RBC 4.15 3.87 - 5.11 MIL/uL   Hemoglobin 13.5 12.0 - 15.0 g/dL   HCT 39.5 36.0 - 46.0 %   MCV 95.2 78.0 - 100.0 fL   MCH 32.5 26.0 - 34.0 pg   MCHC 34.2 30.0 - 36.0 g/dL   RDW 12.8 11.5 - 15.5 %   Platelets 165 150 - 400 K/uL   Neutrophils Relative % 51 %   Neutro Abs 3.0 1.7 - 7.7 K/uL   Lymphocytes Relative 39 %   Lymphs Abs 2.3 0.7 - 4.0 K/uL   Monocytes Relative 9 %   Monocytes Absolute 0.5 0.1 - 1.0 K/uL   Eosinophils Relative 1 %   Eosinophils Absolute 0.0 0.0 - 0.7 K/uL    Basophils Relative 0 %   Basophils Absolute 0.0 0.0 - 0.1 K/uL  Ammonia     Status: None   Collection Time: 12/16/16  2:52 PM  Result Value Ref Range   Ammonia 13 9 - 35 umol/L  Ethanol     Status: None   Collection Time: 12/16/16  2:52 PM  Result Value Ref Range   Alcohol, Ethyl (B) <5 <5 mg/dL    Comment:        LOWEST DETECTABLE LIMIT FOR SERUM ALCOHOL IS 5 mg/dL FOR MEDICAL PURPOSES ONLY   I-Stat venous blood gas, ED  Status: Abnormal   Collection Time: 12/16/16  3:01 PM  Result Value Ref Range   pH, Ven 7.439 (H) 7.250 - 7.430   pCO2, Ven 42.0 (L) 44.0 - 60.0 mmHg   pO2, Ven 23.0 (LL) 32.0 - 45.0 mmHg   Bicarbonate 28.5 (H) 20.0 - 28.0 mmol/L   TCO2 30 0 - 100 mmol/L   O2 Saturation 41.0 %   Acid-Base Excess 4.0 (H) 0.0 - 2.0 mmol/L   Patient temperature 98.6 F    Collection site IV START    Drawn by Nurse    Sample type VENOUS    Comment VALUES EXPECTED, NO REPEAT    Dg Chest 2 View  Result Date: 12/16/2016 CLINICAL DATA:  Headache and confusion EXAM: CHEST  2 VIEW COMPARISON:  None. FINDINGS: Lungs are clear. Heart size and pulmonary vascularity are normal. No adenopathy. There is aortic atherosclerosis. There is degenerative change in the thoracic spine. IMPRESSION: Aortic atherosclerosis.  No edema or consolidation. Aortic Atherosclerosis (ICD10-I70.0). Electronically Signed   By: Lowella Grip III M.D.   On: 12/16/2016 15:43   Ct Head Wo Contrast  Result Date: 12/16/2016 CLINICAL DATA:  Frontal and occipital region headaches. Dizziness. Recent confusion. EXAM: CT HEAD WITHOUT CONTRAST TECHNIQUE: Contiguous axial images were obtained from the base of the skull through the vertex without intravenous contrast. COMPARISON:  None. FINDINGS: Brain: The ventricles and sulci appear normal in size and configuration for age. There is no evident intracranial mass, hemorrhage, extra-axial fluid collection, acute. A slightly asymmetric deep sulcus in the medial left temporal  lobe is felt to represent an anatomic variant. No focal gray-white compartment lesions are evident. No acute infarct appreciable. Vascular: No appreciable hyperdense vessel. There is mild calcification in the right carotid siphon region. Skull: Bony calvarium appears intact. Sinuses/Orbits: There is mild mucosal thickening in several ethmoid air cells. Other visualized paranasal sinuses are clear. Orbits appear symmetric bilaterally. Other: Mastoid air cells bilaterally are clear. IMPRESSION: No intracranial mass, hemorrhage, or extra-axial fluid collection. Gray-white compartments appear normal. There is slight right carotid siphon calcification. There is mild ethmoid sinus disease bilaterally. Electronically Signed   By: Lowella Grip III M.D.   On: 12/16/2016 15:43   Mr Brain Wo Contrast  Result Date: 12/16/2016 CLINICAL DATA:  Severe frontal headache, feeling unwell for 2 days. History of dementia, multiple head injuries. EXAM: MRI HEAD WITHOUT CONTRAST TECHNIQUE: Multiplanar, multiecho pulse sequences of the brain and surrounding structures were obtained without intravenous contrast. COMPARISON:  CT HEAD December 16, 2016 at 1530 hours FINDINGS: Mild motion degraded examination. BRAIN: 7 mm focus reduced diffusion LEFT frontal white matter with slight T2 shine through. Ventricles and sulci are normal for patient's age. Additional minimal chronic small vessel ischemic disease, less than expected for age. 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 No susceptibility artifact to suggest hemorrhage. No midline shift, mass effect or masses. No susceptibility artifact to suggest hemorrhage. The ventricles and sulci are normal for patient's age. No suspicious parenchymal signal, mass or mass effect. No abnormal extra-axial fluid collections. VASCULAR: Normal major intracranial vascular flow voids present at skull base. SKULL AND UPPER CERVICAL SPINE: No abnormal sellar expansion. No suspicious calvarial bone marrow  signal. Craniocervical junction maintained. SINUSES/ORBITS: The mastoid air-cells and included paranasal sinuses are well-aerated. The included ocular globes and orbital contents are non-suspicious. OTHER: None. IMPRESSION: 1. Mild motion degraded examination. 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 2. Otherwise negative noncontrast MRI of the head for age. Electronically Signed  By: Elon Alas M.D.   On: 12/16/2016 18:58     Assessment: 76 y.o. 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  Stroke Risk Factors - age  Plan: 1. HgbA1c, fasting lipid panel 2. MRI, MRA  of the brain without contrast 3. Echocardiogram 4. Carotid dopplers 5. Prophylactic therapy-Antiplatelet med: Aspirin 81 mg daily 6. Atorvastatin 40 mg daily 7. Risk factor modification 8. Telemetry monitoring 9. Frequent neuro checks 10. PT/OT SLP   Dorian Pod, MD [facility] [phone]  12/17/2016, 1:33 AM

## 2016-12-17 NOTE — Evaluation (Signed)
Physical Therapy Evaluation Patient Details Name: Janice [name] MRN: [id number] DOB: 1941-03-07 Today's Date: 12/17/2016   History of Present Illness  76 y.o. 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 Pt currently with functional limitations due to the deficits listed below (see PT Problem List). Overall moving well; noted occasional scissoring with 2-3 instances of loss of balance from which Ms. Diem recovered independently; would like to see her another session before dc -- I anticipate she will do quite well and meet acute PT goals;  Pt will benefit from skilled PT to increase their independence and safety with mobility to allow discharge to the venue listed below.       Follow Up Recommendations No PT follow up    Equipment Recommendations  None recommended by PT    Recommendations for Other Services OT consult     Precautions / Restrictions Precautions Precautions: Fall Precaution Comments: Fall risk slight; I anticipate better balance and gait next session      Mobility  Bed Mobility Overal bed mobility: Independent                Transfers Overall transfer level: Independent                  Ambulation/Gait Ambulation/Gait assistance: Supervision Ambulation Distance (Feet): 150 Feet Assistive device: None Gait Pattern/deviations: Step-through pattern   Gait velocity interpretation: at or above normal speed for age/gender General Gait Details: Occasional scissoring; noted 2-3 intances of loss of balance from which Ms. Zingaro  recovered without assist  Stairs Stairs: Yes Stairs assistance: Supervision Stair Management: One rail Right;One rail Left;Alternating pattern;Forwards Number of Stairs: 2 General stair comments: No difficulty  Wheelchair Mobility    Modified Rankin (Stroke Patients Only) Modified Rankin (Stroke Patients Only) Pre-Morbid Rankin Score: No symptoms Modified Rankin: No significant disability     Balance                                             Pertinent Vitals/Pain Pain Assessment: Faces Faces Pain Scale: Hurts a little bit Pain Location: Headache Pain Descriptors / Indicators: Headache Pain Intervention(s): Monitored during session    Home Living Family/patient expects to be discharged to:: Private residence Living Arrangements: Alone Available Help at Discharge: Family;Available PRN/intermittently (Daughter checks on her) Type of Home: House Home Access: Stairs to enter Entrance Stairs-Rails:  (to be determined) Entrance Stairs-Number of Steps: 2 Home Layout: One level        Prior Function Level of Independence: Independent         Comments: Drives; cleans house; likes to clean her floors     Hand Dominance        Extremity/Trunk Assessment   Upper Extremity Assessment Upper Extremity Assessment: Defer to OT evaluation    Lower Extremity Assessment Lower Extremity Assessment:  (Noted occasional scissoring and decr coordination)    Cervical / Trunk Assessment Cervical / Trunk Assessment: Normal  Communication   Communication: No difficulties  Cognition Arousal/Alertness: Awake/alert Behavior During Therapy:  WFL for tasks assessed/performed Overall Cognitive Status: Within Functional Limits for tasks assessed                                        General Comments      Exercises     Assessment/Plan    PT Assessment Patient needs continued PT services  PT Problem List Decreased balance;Decreased  coordination       PT Treatment Interventions Gait training;Stair training;Functional mobility training;Therapeutic activities;Therapeutic exercise;Balance training;Neuromuscular re-education;Patient/family education    PT Goals (Current goals can be found in the Care Plan section)  Acute Rehab PT Goals Patient Stated Goal: REALLY wanting to eat PT Goal Formulation: With patient Time For Goal Achievement: 12/24/16 Potential to Achieve Goals: Good    Frequency Min 4X/week   Barriers to discharge        Co-evaluation               AM-PAC PT "6 Clicks" Daily Activity  Outcome Measure Difficulty turning over in bed (including adjusting bedclothes, sheets and blankets)?: None Difficulty moving from lying on back to sitting on the side of the bed? : None Difficulty sitting down on and standing up from a chair with arms (e.g., wheelchair, bedside commode, etc,.)?: None Help needed moving to and from a bed to chair (including a wheelchair)?: None Help needed walking in hospital room?: A Little Help needed climbing 3-5 steps with a railing? : None 6 Click Score: 23    End of Session Equipment Utilized During Treatment: Gait belt Activity Tolerance: Patient tolerated treatment well Patient left: in chair;with call bell/phone within reach;Other (comment) (eating a sandwich) Nurse Communication: Mobility status PT Visit Diagnosis: Unsteadiness on feet (R26.81)    Time: 3244-0102 PT Time Calculation (min) (ACUTE ONLY): 18 min   Charges:   PT Evaluation $PT Eval Low Complexity: 1 Procedure     PT G Codes:   PT G-Codes **NOT FOR INPATIENT CLASS** Functional Assessment Tool Used: Clinical judgement Functional Limitation: Mobility: Walking and moving around Mobility: Walking and Moving Around Current Status (V2536): At least 1 percent but less than 20 percent impaired, limited or restricted Mobility: Walking and Moving Around Goal Status [id number]): 0 percent impaired, limited or  restricted    Roney Marbeth, PT  [place] Pager [phone] Office [place] 12/17/2016, 3:06 PM

## 2016-12-17 NOTE — Progress Notes (Signed)
  Echocardiogram 2D Echocardiogram has been performed.  Janice [name] 12/17/2016, 11:32 AM

## 2016-12-17 NOTE — Progress Notes (Signed)
STROKE TEAM PROGRESS NOTE   HISTORY OF PRESENT ILLNESS (per record) Janice [name] is an 76 y.o. female with a past medical history significant for mild cognitive impairment, hypothyroidism, head trauma, and brain surgery, transferred from [facility] for further stroke evaluation. Patient daughter is at the bedside and said that Janice [name] is very functional and still capable of living independently despite of having some cognitive changes and frequent falls.  Stated that in the past two days her mother has been definitely more confused, complaining of HA, and today she also noticed slurred speech, some R face droop, and imbalance.  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 Presently, she offers no neurological complaints. Serologies and UA are unimpressive.  Date last known well: 2-3 days ago Time last known well: unable to determine. tPA Given: no, out of the window NIHSS: 2 Janice: 0   SUBJECTIVE (INTERVAL HISTORY) The patient was seen in the vascular lab. No family members present. The patient states that her headache has resolved.   OBJECTIVE Temp:  [97.7 F (36.5 C)-98.5 F (36.9 C)] 97.7 F (36.5 C) (07/08 0430) Pulse Rate:  [55-85] 58 (07/08 0430) Cardiac Rhythm: Normal sinus rhythm (07/08 0430) Resp:  [12-18] 16 (07/08 0430) BP: (97-160)/(52-74) 107/58 (07/08 0430) SpO2:  [94 %-100 %] 98 % (07/08 0430) Weight:  [61.7 kg (136 lb)] 61.7 kg (136 lb) (07/08 0030)  CBC:  Recent Labs Lab 12/16/16 1452  WBC 5.9  NEUTROABS 3.0  HGB 13.5  HCT 39.5  MCV 95.2  PLT 403    Basic Metabolic Panel:  Recent Labs Lab 12/16/16 1452  NA 139  K 3.6  CL 103  CO2 26  GLUCOSE 94  BUN 14  CREATININE 0.68  CALCIUM 9.2    Lipid Panel:    Component Value Date/Time   CHOL 181 12/17/2016 0400   TRIG 80 12/17/2016 0400   HDL 88  12/17/2016 0400   CHOLHDL 2.1 12/17/2016 0400   VLDL 16 12/17/2016 0400   LDLCALC 77 12/17/2016 0400   HgbA1c: No results found for: HGBA1C Urine Drug Screen: No results found for: LABOPIA, COCAINSCRNUR, LABBENZ, AMPHETMU, THCU, LABBARB  Alcohol Level     Component Value Date/Time   ETH <5 12/16/2016 1452    IMAGING  Dg Chest 2 View 12/16/2016 Aortic atherosclerosis.  No edema or consolidation.    Ct Head Wo Contrast 12/16/2016 No intracranial mass, hemorrhage, or extra-axial fluid collection. Gray-white compartments appear normal. There is slight right carotid siphon calcification. There is mild ethmoid sinus disease bilaterally.    Mr Brain Wo Contrast 12/16/2016 1. Mild motion degraded examination. 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  2. Otherwise negative noncontrast MRI of the head for age.   MRA Head 12/17/2016 1. No stenosis or embolic source to explain the acute infarct. 2. 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  PHYSICAL EXAM Pleasant elderly caucasian lady not in distress. . Afebrile. Head is nontraumatic. Neck is supple without bruit.    Cardiac exam no murmur or gallop. Lungs are clear to auscultation. Distal pulses are well felt.  Neurological Exam ;  Awake  Alert oriented x 3. Normal speech and language.eye movements full without nystagmus.fundi were not visualized. Vision acuity and fields appear normal. Hearing is normal. Palatal movements are  normal. Face symmetric. Tongue midline. Normal strength, tone, reflexes and coordination. Normal sensation. Gait deferred.     ASSESSMENT/PLAN Ms. Janice [name] is a 76 y.o. female with history of thyroid disease, previous stroke and a history of a head injury with subsequent surgery presenting with headache, increased confusion, slurred speech, right facial droop, and imbalance. She did not receive IV t-PA due to late  presentation.  New onset headache which has resolved of unclear etiology. MRI shows a possible tiny silent subacute left frontal white matter infarct:    Resultant - headache resolved  CT head - unremarkable  MRI head - no acute findings. 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  Carotid Doppler - 1-39% ICA plaquing.  Vertebral artery flow is antegrade.  Left ICA is tortuous in the mid portion, resulting in elevated velocities, however, plaque morphology does not support significant stenosis.   2D Echo - pending  LDL - 77  HgbA1c - pending  VTE prophylaxis - Lovenox  Diet NPO time specified  No antithrombotic prior to admission, now on aspirin 81 mg daily  Patient counseled to be compliant with her antithrombotic medications  Ongoing aggressive stroke risk factor management  Therapy recommendations: pending  Disposition: Pending  Hypertension  Stable although blood pressure tends to run somewhat low. (Not on antihypertensive medications)  Permissive hypertension (OK if < 220/120) but gradually normalize in 5-7 days  Long-term BP goal normotensive  Hyperlipidemia  Home meds: No lipid lowering medications prior to admission  LDL 77, goal < 70  Now on Lipitor 40 mg daily  Continue statin at discharge    Other Stroke Risk Factors  Advanced age  Hx stroke/TIA - by imaging   Other Active Problems  History of a head injury  Hospital day # 0  I have personally examined this patient, reviewed notes, independently viewed imaging studies, participated in medical decision making and plan of care.ROS completed by me personally and pertinent positives fully documented  I have made any additions or clarifications directly to the above note. She presented with isolated new onset headache which appears to have resolved and is of unclear etiology. MRI scan shows what appears to be a tiny punctate subacute  left frontal white matter infarct which is clinically silent. Recommend aspirin for stroke prevention and ongoing stroke workup. Long discussion with the patient and about her silent infarct, risk for recurrent stroke, TIA, need for evaluation and risk factor control and answered questions. Greater than 50% time during this 35 minute visit was spent on discussion about silent infarct, risk for recurrent stroke, TIA and stroke prevention discussion  Antony Contras, MD Medical Director [facility] Pager: [phone] 12/17/2016 12:32 PM   To contact Stroke Continuity provider, please refer to [email or web address] After hours, contact General Neurology

## 2016-12-17 NOTE — Evaluation (Signed)
Clinical/Bedside Swallow Evaluation Patient Details  Name: Janice [name] MRN: [id number] Date of Birth: Jun 07, 1941  Today's Date: 12/17/2016 Time: SLP Start Time (ACUTE ONLY): 1937 SLP Stop Time (ACUTE ONLY): 1427 SLP Time Calculation (min) (ACUTE ONLY): 20 min  Past Medical History:  Past Medical History:  Diagnosis Date  . Head injury   . Thyroid disease    Past Surgical History:  Past Surgical History:  Procedure Laterality Date  . ABDOMINAL HYSTERECTOMY    . APPENDECTOMY    . BRAIN SURGERY    . COLON SURGERY     HPI:  Ariyan Sinnett a 76 y.o.femalewith medical history significant for hypothyroidism and remote head injury, presented to the emergency department for evaluation of severe frontal headache and confusion. The patient is accompanied by her daughter who assists with the history. Patient lives alone and suffers from chronic headaches, typically frontal, but began to complain of a more severe than usual headache approximately 2 days ago. She otherwise seemed to be in her usual state when the daughter spoke with her by phone yesterday. Today, when the patient's daughter visited, patient was found to be significantly confused, complaining of the severe frontal headache, and also complaining of blurred vision. She typically is fully independent with her ADLs, but could not even tie her shoes today due to the confusion. Headache is described as severe, constant, localized to the frontal region, and without alleviating or exacerbating factors identified. Patient denied any focal numbness or weakness. Denies fevers or chills, dysuria or flank pain, chest pain or palpitations. Her daughter reports that there has been some very mild and fleeting episodes of confusion over the past 6 months or so, but never anything like this.   MRI is showing sub acute to old left frontal ischemic infarct.  Chest xray is negative for edema or consolidation.     Assessment / Plan /  Recommendation Clinical Impression  Clinical swallowing evaluation was completed with thin liquids via tsp, cup and straw sips, pureed material and dry solids.  Oral mechanism exam was completed and unremarkable.  The patient's oral and pharyngeal swallow appeared to be functional.  Swallow trigger appeared to be timely and hyo-laryngeal excursion was appreciated.  Mastication of dry solids appeared to be functional.  Overt s/s of aspiration were not seen.  Recommend a regular diet with thin liquids.  ST follow up for swallowing is not indicated.  Speech/language evaluation is pending.        Aspiration Risk  No limitations    Diet Recommendation   Regular with thin liquids  Medication Administration: Whole meds with liquid    Other  Recommendations Oral Care Recommendations: Oral care BID   Follow up Recommendations None        Swallow Study   General Date of Onset: 12/16/16 HPI: Janice [name] a 76 y.o.femalewith medical history significant for hypothyroidism and remote head injury, presented to the emergency department for evaluation of severe frontal headache and confusion. The patient is accompanied by her daughter who assists with the history. Patient lives alone and suffers from chronic headaches, typically frontal, but began to complain of a more severe than usual headache approximately 2 days ago. She otherwise seemed to be in her usual state when the daughter spoke with her by phone yesterday. Today, when the patient's daughter visited, patient was found to be significantly confused, complaining of the severe frontal headache, and also complaining of blurred vision. She typically is fully independent with her ADLs, but could not even  tie her shoes today due to the confusion. Headache is described as severe, constant, localized to the frontal region, and without alleviating or exacerbating factors identified. Patient denied any focal numbness or weakness. Denies fevers or chills,  dysuria or flank pain, chest pain or palpitations. Her daughter reports that there has been some very mild and fleeting episodes of confusion over the past 6 months or so, but never anything like this.   MRI is showing sub acute to old left frontal ischemic infarct.  Chest xray is negative for edema or consolidation.   Type of Study: Bedside Swallow Evaluation Previous Swallow Assessment: None noted at [facility].   Diet Prior to this Study: NPO Temperature Spikes Noted: No Respiratory Status: Room air History of Recent Intubation: No Behavior/Cognition: Alert;Cooperative Oral Cavity Assessment: Within Functional Limits Oral Care Completed by SLP: No Oral Cavity - Dentition: Missing dentition (Pt has a partial that is not available.  ) Vision: Functional for self-feeding Self-Feeding Abilities: Able to feed self Patient Positioning: Upright in bed Baseline Vocal Quality: Normal Volitional Cough: Weak Volitional Swallow: Able to elicit    Oral/Motor/Sensory Function Overall Oral Motor/Sensory Function: Within functional limits   Ice Chips Ice chips: Not tested   Thin Liquid Thin Liquid: Within functional limits Presentation: Cup;Spoon;Straw;Self Fed    Nectar Thick Nectar Thick Liquid: Not tested   Honey Thick Honey Thick Liquid: Not tested   Puree Puree: Within functional limits Presentation: Self Fed;Spoon   Solid   GO   Solid: Within functional limits Presentation: Self Fed    Functional Assessment Tool Used: ASHA NOMS and clinical judgment.   Functional Limitations: Swallowing Swallow Current Status (R4854): 0 percent impaired, limited or restricted Swallow Goal Status (O2703): 0 percent impaired, limited or restricted Swallow Discharge Status [phone]): 0 percent impaired, limited or restricted   Shelly Flatten, MA, [facility] SLP [phone] Lamar Sprinkles 12/17/2016,2:39 PM

## 2016-12-17 NOTE — Progress Notes (Signed)
OT Cancellation Note  Patient Details Name: Janice [name] MRN: [id number] DOB: [birth year]   Cancelled Treatment:    Reason Eval/Treat Not Completed: Patient at procedure or test/ unavailable; Pt at MRI, will check back as schedule permits.   Lou Cal, OT Pager [phone] 12/17/2016   Raymondo Band 12/17/2016, 8:46 AM

## 2016-12-17 NOTE — Progress Notes (Signed)
PROGRESS NOTE    Janice [name]  VQM:[id number] DOB: 1941-02-05 DOA: 12/16/2016 PCP: Patient, No Pcp Per    Brief Narrative:  76 y.o.femalewith a past medical history significant for mild cognitive impairment, hypothyroidism, head trauma, and brain surgery, transferred from [facility] for further stroke evaluation.  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  Assessment & Plan:   Principal Problem:   Ischemic stroke of frontal lobe [facility]) - Neurology managing and recommended the following:  Resultant - headache resolved  CT head - unremarkable  MRI head - no acute findings. 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  Carotid Doppler - 1-39% ICA plaquing. Vertebral artery flow is antegrade. Left ICA is tortuous in the mid portion, resulting in elevated velocities, however, plaque morphology does not support significant stenosis.   2D Echo - pending  LDL - 77  HgbA1c - pending  VTE prophylaxis - Lovenox  Diet NPO time specified  No antithrombotic prior to admission, now on aspirin 81 mg daily  Patient counseled to be compliant with her antithrombotic medications  Ongoing aggressive stroke risk factor management  Therapy recommendations: pending  Disposition: Pending  Patient is not on hypertensive medication at home.  Hyperlipidemia - LDL 77, goal < 70 - now on lipitor.  Active Problems:     Confusion   Hypothyroidism - continue thyroid replacement therapy    DVT prophylaxis: Lovenox Code Status: Full Family Communication: spoke with daughter at bedside. Disposition Plan: pending stroke work up   Consultants:   Neurology   Procedures: stroke work up   Antimicrobials: none   Subjective: Pt has no new complaints of any neurological deficits.   Objective: Vitals:   12/17/16 0800 12/17/16 0908 12/17/16 1317 12/17/16 1717    BP: (!) 144/69  130/60 (!) 145/85  Pulse: 66  67 70  Resp:   18 18  Temp: 97.9 F (36.6 C)  98.4 F (36.9 C) 98.2 F (36.8 C)  TempSrc: Oral Other (Comment) Oral Oral  SpO2: 98%  98% 95%  Weight:      Height:        Intake/Output Summary (Last 24 hours) at 12/17/16 1720 Last data filed at 12/16/16 2251  Gross per 24 hour  Intake              500 ml  Output                0 ml  Net              500 ml   Filed Weights   12/17/16 0030  Weight: 61.7 kg (136 lb)    Examination:  General exam: Awake and alert, in nad Respiratory system: Clear to auscultation. Respiratory effort normal. No wheezes Cardiovascular system: S1 & S2 heard, RRR. No JVD, murmurs, rubs, Gastrointestinal system: Abdomen is nondistended, soft and nontender. No organomegaly or masses felt. Normal bowel sounds heard. Central nervous system: Answers questions appropriately, moves extremities equally Extremities: no cyanosis Skin: No rashes, lesions or ulcers, on limited exam. Psychiatry: Mood & affect appropriate.   Data Reviewed: I have personally reviewed following labs and imaging studies  CBC:  Recent Labs Lab 12/16/16 1452  WBC 5.9  NEUTROABS 3.0  HGB 13.5  HCT 39.5  MCV 95.2  PLT 932   Basic Metabolic Panel:  Recent Labs Lab 12/16/16 1452  NA 139  K 3.6  CL 103  CO2 26  GLUCOSE 94  BUN 14  CREATININE 0.68  CALCIUM 9.2   GFR: Estimated Creatinine Clearance: 51.7 mL/min (by C-G formula based on SCr of 0.68 mg/dL). Liver Function Tests:  Recent Labs Lab 12/16/16 1452  AST 24  ALT 16  ALKPHOS 39  BILITOT 0.6  PROT 6.8  ALBUMIN 3.9   No results for input(s): LIPASE, AMYLASE in the last 168 hours.  Recent Labs Lab 12/16/16 1452  AMMONIA 13   Coagulation Profile: No results for input(s): INR, PROTIME in the last 168 hours. Cardiac Enzymes: No results for input(s): CKTOTAL, CKMB, CKMBINDEX, TROPONINI in the last 168 hours. BNP (last 3 results) No results for  input(s): PROBNP in the last 8760 hours. HbA1C: No results for input(s): HGBA1C in the last 72 hours. CBG: No results for input(s): GLUCAP in the last 168 hours. Lipid Profile:  Recent Labs  12/17/16 0400  CHOL 181  HDL 88  LDLCALC 77  TRIG 80  CHOLHDL 2.1   Thyroid Function Tests:  Recent Labs  12/17/16 0640  TSH 6.945*   Anemia Panel: No results for input(s): VITAMINB12, FOLATE, FERRITIN, TIBC, IRON, RETICCTPCT in the last 72 hours. Sepsis Labs: No results for input(s): PROCALCITON, LATICACIDVEN in the last 168 hours.  No results found for this or any previous visit (from the past 240 hour(s)).       Radiology Studies: Dg Chest 2 View  Result Date: 12/16/2016 CLINICAL DATA:  Headache and confusion EXAM: CHEST  2 VIEW COMPARISON:  None. FINDINGS: Lungs are clear. Heart size and pulmonary vascularity are normal. No adenopathy. There is aortic atherosclerosis. There is degenerative change in the thoracic spine. IMPRESSION: Aortic atherosclerosis.  No edema or consolidation. Aortic Atherosclerosis (ICD10-I70.0). Electronically Signed   By: Lowella Grip III M.D.   On: 12/16/2016 15:43   Ct Head Wo Contrast  Result Date: 12/16/2016 CLINICAL DATA:  Frontal and occipital region headaches. Dizziness. Recent confusion. EXAM: CT HEAD WITHOUT CONTRAST TECHNIQUE: Contiguous axial images were obtained from the base of the skull through the vertex without intravenous contrast. COMPARISON:  None. FINDINGS: Brain: The ventricles and sulci appear normal in size and configuration for age. There is no evident intracranial mass, hemorrhage, extra-axial fluid collection, acute. A slightly asymmetric deep sulcus in the medial left temporal lobe is felt to represent an anatomic variant. No focal gray-white compartment lesions are evident. No acute infarct appreciable. Vascular: No appreciable hyperdense vessel. There is mild calcification in the right carotid siphon region. Skull: Bony  calvarium appears intact. Sinuses/Orbits: There is mild mucosal thickening in several ethmoid air cells. Other visualized paranasal sinuses are clear. Orbits appear symmetric bilaterally. Other: Mastoid air cells bilaterally are clear. IMPRESSION: No intracranial mass, hemorrhage, or extra-axial fluid collection. Gray-white compartments appear normal. There is slight right carotid siphon calcification. There is mild ethmoid sinus disease bilaterally. Electronically Signed   By: Lowella Grip III M.D.   On: 12/16/2016 15:43   Mr Brain Wo Contrast  Result Date: 12/16/2016 CLINICAL DATA:  Severe frontal headache, feeling unwell for 2 days. History of dementia, multiple head injuries. EXAM: MRI HEAD WITHOUT CONTRAST TECHNIQUE: Multiplanar, multiecho pulse sequences of the brain and surrounding structures were obtained without intravenous contrast. COMPARISON:  CT HEAD December 16, 2016 at 1530 hours FINDINGS: Mild motion degraded examination. BRAIN: 7 mm focus reduced diffusion LEFT frontal white matter with slight T2 shine through. Ventricles and sulci are normal for patient's age. Additional minimal chronic small vessel ischemic disease,  less than expected for age. 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 No susceptibility artifact to suggest hemorrhage. No midline shift, mass effect or masses. No susceptibility artifact to suggest hemorrhage. The ventricles and sulci are normal for patient's age. No suspicious parenchymal signal, mass or mass effect. No abnormal extra-axial fluid collections. VASCULAR: Normal major intracranial vascular flow voids present at skull base. SKULL AND UPPER CERVICAL SPINE: No abnormal sellar expansion. No suspicious calvarial bone marrow signal. Craniocervical junction maintained. SINUSES/ORBITS: The mastoid air-cells and included paranasal sinuses are well-aerated. The included ocular globes and orbital contents are non-suspicious. OTHER: None. IMPRESSION: 1. Mild motion degraded  examination. 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 2. Otherwise negative noncontrast MRI of the head for age. Electronically Signed   By: Elon Alas M.D.   On: 12/16/2016 18:58   Mr Jodene Nam Head/brain DX Cm  Result Date: 12/17/2016 CLINICAL DATA:  Frontal headache and confusion.  Acute infarct. EXAM: MRA HEAD WITHOUT CONTRAST TECHNIQUE: Angiographic images of the Circle of Willis were obtained using MRA technique without intravenous contrast. COMPARISON:  Brain MRI from yesterday FINDINGS: Symmetric carotid and vertebral arteries. Incomplete circle-of-Willis at the right posterior communicating artery. Branches are smooth and widely patent. Bulbous appearance of the ICA at the right anterior genu, indeterminate between atheromatous irregularity and a sessile 2 mm aneurysm directed laterally. IMPRESSION: 1. No stenosis or embolic source to explain the acute infarct. 2. 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 Electronically Signed   By: Monte Fantasia M.D.   On: 12/17/2016 09:19     Scheduled Meds: .  stroke: mapping our early stages of recovery book   Does not apply Once  . aspirin EC  81 mg Oral Daily  . atorvastatin  40 mg Oral q1800  . enoxaparin (LOVENOX) injection  40 mg Subcutaneous Daily  . LORazepam      . thyroid  30 mg Oral QAC breakfast   Continuous Infusions:   LOS: 0 days    Time spent: > 35 minutes  Velvet Bathe, MD [facility] Pager [phone]  If 7PM-7AM, please contact night-coverage [email or web address] Password [email or web address] 12/17/2016, 5:20 PM

## 2016-12-17 NOTE — Evaluation (Signed)
Occupational Therapy Evaluation Patient Details Name: Janice [name] MRN: [id number] DOB: [birth year] Today's Date: 12/17/2016    History of Present Illness 76 y.o. 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 Pt lives alone, and at baseline is independent with ADLs, some IADLs (including driving and housecleaning) and functional mobility. Pt currently requires minA for functional mobility transfers, and minGuard assist for ADLs and functional mobility. Pt will benefit from continued OT services to maximize Pt's safety and independence with ADLs and functional mobility upon return home.     Follow Up Recommendations  Home health OT    Equipment Recommendations  3 in 1 bedside commode           Precautions / Restrictions Precautions Precautions: Fall Precaution Comments: Fall risk slight; I anticipate better balance and gait next session Restrictions Weight Bearing Restrictions: No      Mobility Bed Mobility Overal bed mobility: Needs Assistance Bed Mobility: Supine to Sit;Sit to Supine     Supine to sit: Supervision Sit to supine: Supervision   General bed mobility comments: supervision for safety   Transfers Overall transfer level: Needs assistance Equipment used: None Transfers: Sit to/from Stand Sit to Stand: Supervision         General transfer comment: close guard for safety during sit to/from stand and during functional mobility     Balance Overall balance assessment: Needs assistance Sitting-balance support: Feet supported Sitting  balance-Leahy Scale: Good     Standing balance support: No upper extremity supported;During functional activity Standing balance-Leahy Scale: Fair                             ADL either performed or assessed with clinical judgement   ADL Overall ADL's : Needs assistance/impaired Eating/Feeding: Set up;Sitting   Grooming: Wash/dry hands;Min guard;Standing   Upper Body Bathing: Min guard;Sitting   Lower Body Bathing: Min guard;Sit to/from stand   Upper Body Dressing : Min guard;Sitting   Lower Body Dressing: Min guard;Sit to/from stand Lower Body Dressing Details (indicate cue type and reason): adjusting socks  Toilet Transfer: Minimal assistance;Ambulation;Regular Museum/gallery exhibitions officer and Hygiene: Min guard;Sit to/from stand       Functional mobility during ADLs: Min guard General ADL Comments: Pt and daughter with questions regarding toilet transfer, Pt has tub shower without seat, educated Pt/daughter on safety with using shower chair/3:1 during task completion, educated on different options for stepping into/out of tub (grab bar, using wall, using 3:1); educated on safety during transfer including drying feet prior to stepping out of tub and using non-slip socks during task completion to decrease fall risk                          Pertinent Vitals/Pain Pain Assessment: Faces Faces Pain Scale: Hurts a little bit Pain Location: Headache Pain Descriptors / Indicators: Headache Pain Intervention(s): Monitored during session;Limited activity within patient's tolerance     Hand Dominance Right   Extremity/Trunk Assessment Upper Extremity Assessment Upper Extremity Assessment: Generalized weakness   Lower  Extremity Assessment Lower Extremity Assessment: Defer to PT evaluation   Cervical / Trunk Assessment Cervical / Trunk Assessment: Normal   Communication Communication Communication: HOH   Cognition Arousal/Alertness:  Awake/alert Behavior During Therapy: WFL for tasks assessed/performed Overall Cognitive Status: Within Functional Limits for tasks assessed                                 General Comments: mild cognitive impairment at baseline; oriented to day of week, disoriented to month (stating month is august), initially disoriented to year, able to self correct; Pt requires increased time and repetition to follow directions (also partly due to [facility]); oriented to location   General Comments                  Home Living Family/patient expects to be discharged to:: Private residence Living Arrangements: Alone Available Help at Discharge: Family;Available PRN/intermittently (daughter checks in on her ) Type of Home: House Home Access: Stairs to enter [employer] of Steps: 2 Entrance Stairs-Rails:  (to be determined ) Home Layout: One level     Bathroom Shower/Tub: Tub/shower unit;Other (comment)   Bathroom Toilet: Standard     Home Equipment: None          Prior Functioning/Environment Level of Independence: Independent        Comments: Drives; cleans house; likes to clean her floors        OT Problem List: Decreased strength;Decreased activity tolerance;Impaired balance (sitting and/or standing)      OT Treatment/Interventions: Self-care/ADL training;DME and/or AE instruction;Therapeutic activities;Balance training;Therapeutic exercise;Patient/family education;Energy conservation    OT Goals(Current goals can be found in the care plan section) Acute Rehab OT Goals Patient Stated Goal: to be able to step in/out of her tub safely  OT Goal Formulation: With patient/family Time For Goal Achievement: 12/31/16 Potential to Achieve Goals: Good ADL Goals Pt Will Perform Grooming: with modified independence;standing Pt Will Perform Upper Body Bathing: with modified independence;sitting Pt Will Perform Lower Body Dressing: with modified independence;sit  to/from stand Pt Will Transfer to Toilet: with modified independence;ambulating;regular height toilet Pt Will Perform Toileting - Clothing Manipulation and hygiene: with modified independence;sit to/from stand Pt Will Perform Tub/Shower Transfer: with supervision;Tub transfer;ambulating;3 in 1  OT Frequency: Min 2X/week                             AM-PAC PT "6 Clicks" Daily Activity     Outcome Measure Help from another person eating meals?: None Help from another person taking care of personal grooming?: None Help from another person toileting, which includes using toliet, bedpan, or urinal?: A Little Help from another person bathing (including washing, rinsing, drying)?: A Little Help from another person to put on and taking off regular upper body clothing?: None Help from another person to put on and taking off regular lower body clothing?: A Little 6 Click Score: 21   End of Session Equipment Utilized During Treatment: Gait belt Nurse Communication: Mobility status  Activity Tolerance: Patient tolerated treatment well Patient left: in bed;with call bell/phone within reach;with family/visitor present  OT Visit Diagnosis: Muscle weakness (generalized) (M62.81)                Time: 6333-5456 OT Time Calculation (min): 29 min Charges:  OT General Charges $OT Visit: 1 Procedure OT Evaluation $OT Eval Low Complexity: 1 Procedure OT Treatments $Self Care/Home Management :  8-22 mins G-Codes: OT G-codes **NOT FOR INPATIENT CLASS** Functional Assessment Tool Used: AM-PAC 6 Clicks Daily Activity;Clinical judgement Functional Limitation: Self care Self Care Current Status (T7409): At least 20 percent but less than 40 percent impaired, limited or restricted Self Care Goal Status (L2780): At least 1 percent but less than 20 percent impaired, limited or restricted   Lou Cal, OT Pager [phone] 12/17/2016   Raymondo Band 12/17/2016, 4:38 PM

## 2016-12-18 ENCOUNTER — Inpatient Hospital Stay (HOSPITAL_COMMUNITY)
Admit: 2016-12-18 | Discharge: 2016-12-18 | Disposition: A | Discharge status: Home / Self Care | Payer: Medicare PPO | Attending: Family Medicine | Admitting: Family Medicine

## 2016-12-18 ENCOUNTER — Inpatient Hospital Stay (HOSPITAL_COMMUNITY): Payer: Medicare PPO

## 2016-12-18 DIAGNOSIS — G9340 Encephalopathy, unspecified: Secondary | ICD-10-CM

## 2016-12-18 LAB — HEMOGLOBIN A1C
HEMOGLOBIN A1C: 5.1 % (ref 4.8–5.6)
MEAN PLASMA GLUCOSE: 100 mg/dL

## 2016-12-18 LAB — T4, FREE: Free T4: 0.71 ng/dL (ref 0.61–1.12)

## 2016-12-18 LAB — SEDIMENTATION RATE: Sed Rate: 2 mm/hr (ref 0–22)

## 2016-12-18 MED ORDER — LORAZEPAM 2 MG/ML IJ SOLN
1.0000 mg | Freq: Once | INTRAMUSCULAR | Status: AC
  Administered 2016-12-18: 1 mg via INTRAVENOUS
  Filled 2016-12-18: qty 1

## 2016-12-18 MED ORDER — SODIUM CHLORIDE 0.9 % IV BOLUS (SEPSIS)
500.0000 mL | Freq: Once | INTRAVENOUS | Status: AC
  Administered 2016-12-18: 500 mL via INTRAVENOUS

## 2016-12-18 MED ORDER — LORAZEPAM 2 MG/ML IJ SOLN
0.5000 mg | Freq: Once | INTRAMUSCULAR | Status: AC
  Administered 2016-12-18: 0.5 mg via INTRAVENOUS
  Filled 2016-12-18: qty 1

## 2016-12-18 MED ORDER — GADOBENATE DIMEGLUMINE 529 MG/ML IV SOLN
15.0000 mL | Freq: Once | INTRAVENOUS | Status: AC
  Administered 2016-12-18: 15 mL via INTRAVENOUS

## 2016-12-18 NOTE — Progress Notes (Signed)
EEG Completed; Results Pending  

## 2016-12-18 NOTE — Progress Notes (Signed)
Occupational Therapy Treatment Patient Details Name: Janice [name] MRN: [id number] DOB: 11/21/1940 Today's Date: 12/18/2016    History of present illness 76 y.o. 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 Educated pt and daughter concerning safe tub transfers and DME options and pt reports preference for shower seat over 3-in-1. Pt with decreased inhibition noted during functional activities making her unsafe at times and requiring verbal cues to redirect attention to task at hand. She would benefit from 24 hour supervision at home initially as well as home health OT services in order to maximize safety and independence in her home setting.   Follow Up Recommendations  Home health OT    Equipment Recommendations  Tub/shower seat (preferably white)    Recommendations for Other Services      Precautions / Restrictions Precautions Precautions: Fall Restrictions Weight Bearing Restrictions: No       Mobility Bed Mobility Overal bed mobility: Modified Independent Bed Mobility: Supine to Sit     Supine to sit: Modified independent (Device/Increase time)     General bed mobility comments: no physical assist required  Transfers Overall transfer level: Needs assistance Equipment used: None Transfers: Sit to/from Stand Sit to Stand: Supervision         General transfer comment: Supervision for safety. Pt impuslively standing and becoming unsteady once standing.     Balance Overall balance assessment: Needs assistance Sitting-balance support: No upper extremity  supported;Feet supported Sitting balance-Leahy Scale: Good     Standing balance support: No upper extremity supported;During functional activity Standing balance-Leahy Scale: Fair               High level balance activites: Side stepping;Backward walking;Direction changes;Turns;Sudden stops;Head turns High Level Balance Comments: modest instability noted during higher level balance tasks, no physical assist or overt LOB           ADL either performed or assessed with clinical judgement   ADL Overall ADL's : Needs assistance/impaired     Grooming: Supervision/safety;Wash/dry hands;Standing               Lower Body Dressing: Supervision/safety;Sit to/from stand   Toilet Transfer: Min guard;Ambulation   Toileting- Clothing Manipulation and Hygiene: Supervision/safety;Sit to/from stand   Tub/ Shower Transfer: Min guard;Tub transfer;Ambulation;3 in 1;Shower seat   Functional mobility during ADLs: Min guard General ADL Comments: Pt unsteady and rushing through tasks impulsively at times. Daughter and pt educated on tub transfers with shower seat and 3-in-1 and pt hyperfocused on desire for white shower seat. Difficulty attending to education.      Vision       Perception     Praxis      Cognition Arousal/Alertness: Awake/alert Behavior During Therapy: WFL for tasks assessed/performed Overall Cognitive Status: History of cognitive impairments - at baseline Area of Impairment: Attention;Awareness                   Current Attention Level: Selective       Awareness: Emergent   General Comments: Pt with history of cognitive impairments. Little inhibition when pt becomes distracted making her unsafe at times.  Exercises     Shoulder Instructions       General Comments      Pertinent Vitals/ Pain       Pain Assessment: No/denies pain  Home Living       Type of Home: House                              Lives With:  Alone    Prior Functioning/Environment              Frequency  Min 2X/week        Progress Toward Goals  OT Goals(current goals can now be found in the care plan section)  Progress towards OT goals: Progressing toward goals  Acute Rehab OT Goals Patient Stated Goal: to be able to step in/out of her tub safely  OT Goal Formulation: With patient/family Time For Goal Achievement: 12/31/16 Potential to Achieve Goals: Good ADL Goals Pt Will Perform Grooming: with modified independence;standing Pt Will Perform Upper Body Bathing: with modified independence;sitting Pt Will Perform Lower Body Dressing: with modified independence;sit to/from stand Pt Will Transfer to Toilet: with modified independence;ambulating;regular height toilet Pt Will Perform Toileting - Clothing Manipulation and hygiene: with modified independence;sit to/from stand Pt Will Perform Tub/Shower Transfer: with supervision;Tub transfer;ambulating;3 in 1  Plan Discharge plan remains appropriate    Co-evaluation                 AM-PAC PT "6 Clicks" Daily Activity     Outcome Measure   Help from another person eating meals?: None Help from another person taking care of personal grooming?: A Little Help from another person toileting, which includes using toliet, bedpan, or urinal?: A Little Help from another person bathing (including washing, rinsing, drying)?: A Little Help from another person to put on and taking off regular upper body clothing?: None Help from another person to put on and taking off regular lower body clothing?: A Little 6 Click Score: 20    End of Session Equipment Utilized During Treatment: Gait belt  OT Visit Diagnosis: Muscle weakness (generalized) (M62.81)   Activity Tolerance Patient tolerated treatment well   Patient Left in bed;with call bell/phone within reach;with family/visitor present   Nurse Communication Mobility status    Functional Assessment Tool Used:  AM-PAC 6 Clicks Daily Activity;Clinical judgement Functional Limitation: Self care Self Care Current Status (C1448): At least 20 percent but less than 40 percent impaired, limited or restricted Self Care Goal Status (J8563): At least 1 percent but less than 20 percent impaired, limited or restricted   Time: 1355-1421 OT Time Calculation (min): 26 min  Charges: OT G-codes **NOT FOR INPATIENT CLASS** Functional Assessment Tool Used: AM-PAC 6 Clicks Daily Activity;Clinical judgement Functional Limitation: Self care Self Care Current Status (J4970): At least 20 percent but less than 40 percent impaired, limited or restricted Self Care Goal Status (Y6378): At least 1 percent but less than 20 percent impaired, limited or restricted OT General Charges $OT Visit: 1 Procedure OT Treatments $Self Care/Home Management : 23-37 mins  Norman Herrlich, MS OTR/L  Pager: [place] A Finnleigh Marchetti 12/18/2016, 2:55 PM

## 2016-12-18 NOTE — Progress Notes (Signed)
Patient's family called out, RN came to room, patient was repeating "she said he or she said" to RN. Patient was able to follow all commands, NIHHS 1 for speech difficulties. VSS, see flowsheet. Patient's symptoms resolved within 2 minutes. Dr. Leonie Man and PA came to bedside, see new orders. Patient is now in bed, symptoms resolved.

## 2016-12-18 NOTE — Progress Notes (Signed)
STROKE TEAM PROGRESS NOTE   HISTORY OF PRESENT ILLNESS (per record) Janice [name] is an 76 y.o. female with a past medical history significant for mild cognitive impairment, hypothyroidism, head trauma, and brain surgery, transferred from [facility] for further stroke evaluation. Patient daughter is at the bedside and said that Janice [name] is very functional and still capable of living independently despite of having some cognitive changes and frequent falls.  Stated that in the past two days her mother has been definitely more confused, complaining of HA, and today she also noticed slurred speech, some R face droop, and imbalance.  Patient had MRI brain done at [facility] that revealed a 7 mm focus reduced diffusion LEFT frontal white matter with slight T2 shine through, more consistent with a subacute to old LEFT frontal infarct. Presently, she offers no neurological complaints. Serologies and UA are unimpressive.  Date last known well: 2-3 days ago Time last known well: unable to determine. tPA Given: no, out of the window NIHSS: 2 Janice: 0   SUBJECTIVE (INTERVAL HISTORY) The patient was seen in the Room with her daughter. Initially she seemed fine but subsequently she started developing transient word finding difficulties and expressive language difficulties and felt anxious. She was advised to lay down quietly and given IV normal saline 500 mL. Plan was to repeat MRI scan as well as an EEG. Plan discussed with the patient's daughter, patient, nurse and Dr. Wendee Beavers at length. When she started having recurrent episodes IV Ativan 0.5 mg was ordered  OBJECTIVE Temp:  [98.1 F (36.7 C)-98.9 F (37.2 C)] 98.9 F (37.2 C) (07/09 1104) Pulse Rate:  [67-79] 72 (07/09 1108) Cardiac Rhythm: Normal sinus rhythm (07/09 0708) Resp:  [15-18] 15 (07/09 1108) BP: (119-145)/(54-93) 120/93 (07/09 1108) SpO2:  [95 %-98 %] 97 % (07/09 1108)  CBC:   Recent Labs Lab 12/16/16 1452  WBC 5.9  NEUTROABS 3.0  HGB  13.5  HCT 39.5  MCV 95.2  PLT 297    Basic Metabolic Panel:   Recent Labs Lab 12/16/16 1452  NA 139  K 3.6  CL 103  CO2 26  GLUCOSE 94  BUN 14  CREATININE 0.68  CALCIUM 9.2    Lipid Panel:     Component Value Date/Time   CHOL 181 12/17/2016 0400   TRIG 80 12/17/2016 0400   HDL 88 12/17/2016 0400   CHOLHDL 2.1 12/17/2016 0400   VLDL 16 12/17/2016 0400   LDLCALC 77 12/17/2016 0400   HgbA1c:  Lab Results  Component Value Date   HGBA1C 5.1 12/17/2016   Urine Drug Screen: No results found for: LABOPIA, COCAINSCRNUR, LABBENZ, AMPHETMU, THCU, LABBARB  Alcohol Level     Component Value Date/Time   ETH <5 12/16/2016 1452    IMAGING  Dg Chest 2 View 12/16/2016 Aortic atherosclerosis.  No edema or consolidation.    Ct Head Wo Contrast 12/16/2016 No intracranial mass, hemorrhage, or extra-axial fluid collection. Gray-white compartments appear normal. There is slight right carotid siphon calcification. There is mild ethmoid sinus disease bilaterally.    Mr Brain Wo Contrast 12/16/2016 1. Mild motion degraded examination. 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  2. Otherwise negative noncontrast MRI of the head for age.   MRA Head 12/17/2016 1. No stenosis or embolic source to explain the acute infarct. 2. 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  PHYSICAL EXAM Pleasant elderly caucasian lady not in distress. . Afebrile. Head is  nontraumatic. Neck is supple without bruit.    Cardiac exam no murmur or gallop. Lungs are clear to auscultation. Distal pulses are well felt.  Neurological Exam ;  Awake  Alert oriented x 3. Fluctuating speech and language at times normal and at times word finding difficulties and hesitation.eye movements full without nystagmus.fundi were not visualized. Vision acuity and fields appear normal. Hearing is normal. Palatal movements are normal. Face symmetric. Tongue  midline. Normal strength, tone, reflexes and coordination. Normal sensation. Gait deferred.     ASSESSMENT/PLAN Ms. Janice [name] is a 76 y.o. female with history of thyroid disease, previous stroke and a history of a head injury with subsequent surgery presenting with headache, increased confusion, slurred speech, right facial droop, and imbalance. She did not receive IV t-PA due to late presentation.  New onset headache which has resolved of unclear etiology. MRI shows a possible tiny silent subacute left frontal white matter infarct:    Resultant - headache resolved  CT head - unremarkable  MRI head - no acute findings. 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  Carotid Doppler - 1-39% ICA plaquing.  Vertebral artery flow is antegrade.  Left ICA is tortuous in the mid portion, resulting in elevated velocities, however, plaque morphology does not support significant stenosis.  2D Echo - Left ventricle: The cavity size was normal. Systolic function was   normal. The estimated ejection fraction was in the range of 55%   to 60%. Wall motion was normal; there were no regional wall    motion abnormalities.  LDL - 77  HgbA1c - 5.1  VTE prophylaxis - Lovenox Diet regular Room service appropriate? Yes; Fluid consistency: Thin  No antithrombotic prior to admission, now on aspirin 81 mg daily  Patient counseled to be compliant with her antithrombotic medications  Ongoing aggressive stroke risk factor management  Therapy recommendations: HHOT  Disposition: home  Hypertension  Stable although blood pressure tends to run somewhat low. (Not on antihypertensive medications)  Permissive hypertension (OK if < 220/120) but gradually normalize in 5-7 days  Long-term BP goal normotensive  Hyperlipidemia  Home meds: No lipid lowering medications prior to admission  LDL 77, goal < 70  Now on Lipitor 40 mg  daily  Continue statin at discharge    Other Stroke Risk Factors  Advanced age  Hx stroke/TIA - by imaging   Other Active Problems  History of a head injury  Hospital day # 1  I have personally examined this patient, reviewed notes, independently viewed imaging studies, participated in medical decision making and plan of care.ROS completed by me personally and pertinent positives fully documented  I have made any additions or clarifications directly to the above note. She presented with isolated new onset headache which appears to have resolved and is of unclear etiology. MRI scan shows what appears to be a tiny punctate subacute left frontal white matter infarct which is clinically silent.  Today she had fluctuating transient speech difficulties exact etiology unclear partial seizures versus TIAs versus underlying anxiety. Recommend bedrest today IV hydration with normal saline, check EEG, MRI w/wo. Long discussion of the bedside with the patient, daughter and Dr. Haywood Lasso and answered questions.. Greater than 50% time during this 35 minute visit was spent on discussion about silent infarct, recurrent symptoms risk for recurrent stroke, TIA and stroke prevention discussion  Antony Contras, MD Medical Director [facility] Pager: [phone] 12/18/2016 12:53 PM  To contact Stroke Continuity provider, please refer to [email or web address] After hours, contact General Neurology

## 2016-12-18 NOTE — Progress Notes (Signed)
Physical Therapy Treatment Patient Details Name: Janice [name] MRN: [id number] DOB: 1941/02/18 Today's Date: 12/18/2016    History of Present Illness 76 y.o. 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 Patient mobilizing well, modest instability but no overt LOB or physical assist. Educated patient on safety with mobility and maintaining safe cadence. Patient receptive. Anticipate patient will be safe for d/c home.   Follow Up Recommendations  No PT follow up     Equipment Recommendations  None recommended by PT    Recommendations for Other Services OT consult     Precautions / Restrictions Precautions Precautions: Fall Restrictions Weight Bearing Restrictions: No    Mobility  Bed Mobility Overal bed mobility: Modified Independent Bed Mobility: Supine to Sit     Supine to sit: Modified independent (Device/Increase time)     General bed mobility comments: no physical assist required  Transfers Overall transfer level: Independent Equipment used: None Transfers: Sit to/from Stand Sit to Stand: Independent         General transfer comment: no physical assist or cues required  Ambulation/Gait Ambulation/Gait assistance: Supervision Ambulation Distance (Feet): 340 Feet Assistive device: None Gait Pattern/deviations: Step-through pattern   Gait velocity interpretation: at or above normal speed for age/gender General Gait Details: modest instability but able to self correct without assist   Stairs Stairs: Yes   Stair Management: One  rail Right;Alternating pattern;Forwards Number of Stairs: 4 General stair comments: No difficulty  Wheelchair Mobility    Modified Rankin (Stroke Patients Only) Modified Rankin (Stroke Patients Only) Pre-Morbid Rankin Score: No symptoms Modified Rankin: No significant disability     Balance Overall balance assessment: Needs assistance Sitting-balance support: Feet supported Sitting balance-Leahy Scale: Good     Standing balance support: No upper extremity supported;During functional activity Standing balance-Leahy Scale: Fair               High level balance activites: Side stepping;Backward walking;Direction changes;Turns;Sudden stops;Head turns High Level Balance Comments: modest instability noted during higher level balance tasks, no physical assist or overt LOB            Cognition Arousal/Alertness: Awake/alert Behavior During Therapy: WFL for tasks assessed/performed Overall Cognitive Status: Within Functional Limits for tasks assessed                                        Exercises      General Comments        Pertinent Vitals/Pain Pain Assessment: No/denies pain    Home Living       Type of Home: House              Prior Function            PT Goals (current goals can now be found in the care plan section) Acute Rehab PT Goals Patient Stated Goal: to be able to step in/out of her tub safely  PT Goal Formulation: With patient Time For Goal Achievement: 12/24/16 Potential to Achieve Goals: Good Progress towards PT goals: Progressing  toward goals    Frequency    Min 4X/week      PT Plan Current plan remains appropriate    Co-evaluation              AM-PAC PT "6 Clicks" Daily Activity  Outcome Measure  Difficulty turning over in bed (including adjusting bedclothes, sheets and blankets)?: None Difficulty moving from lying on back to sitting on the side of the bed? : None Difficulty sitting down on  and standing up from a chair with arms (e.g., wheelchair, bedside commode, etc,.)?: None Help needed moving to and from a bed to chair (including a wheelchair)?: None Help needed walking in hospital room?: A Little Help needed climbing 3-5 steps with a railing? : None 6 Click Score: 23    End of Session Equipment Utilized During Treatment: Gait belt Activity Tolerance: Patient tolerated treatment well Patient left: in chair;with call bell/phone within reach;Other (comment) Nurse Communication: Mobility status PT Visit Diagnosis: Unsteadiness on feet (R26.81)     Time: 0100-7121 PT Time Calculation (min) (ACUTE ONLY): 19 min  Charges:  $Gait Training: 8-22 mins                    G Codes:       Alben Deeds, PT DPT NCS [phone]    Duncan Dull 12/18/2016, 1:32 PM

## 2016-12-18 NOTE — Procedures (Signed)
ELECTROENCEPHALOGRAM REPORT  Date of Study: 12/18/2016  Patient's Name: Janice [name] MRN: [id number] Date of Birth: [birth year]  Referring Provider: Balinda Quails, NP  Clinical History: 76 y.o.femalewith a past medical history significant for mild cognitive impairment, hypothyroidism, head trauma, and brain surgery presents more confused, complaining of HA, and today she also noticed slurred speech, some R face droop, and imbalance.   Medications: L1 acetaminophen (TYLENOL) solution 650 mg  L1 acetaminophen (TYLENOL) suppository 650 mg  L1 acetaminophen (TYLENOL) tablet 650 mg   aspirin EC tablet 81 mg   atorvastatin (LIPITOR) tablet 40 mg   enoxaparin (LOVENOX) injection 40 mg   senna-docusate (Senokot-S) tablet 1 tablet   thyroid (ARMOUR) tablet 30 mg  Outpatient Medications  ARMOUR THYROID 30 MG tablet   estradiol (ESTRACE) 0.5 MG tablet   ibuprofen (ADVIL,MOTRIN) 200 MG tablet   Technical Summary: A multichannel digital EEG recording measured by the international 10-20 system with electrodes applied with paste and impedances below 5000 ohms performed in our laboratory with EKG monitoring in an awake but drowsy patient.  Hyperventilation and photic stimulation were not performed.  The digital EEG was referentially recorded, reformatted, and digitally filtered in a variety of bipolar and referential montages for optimal display.    Description: The patient is awake but drowsy during the recording.  During maximal wakefulness, there is a symmetric, medium voltage 10 Hz posterior dominant rhythm that attenuates with eye opening.  The record is symmetric.  There is an increase in theta slowing of the background.  Stage 2 sleep is not seen.  There were no epileptiform discharges or electrographic seizures seen.    EKG lead was unremarkable.  Impression: This awake but drowsy EEG is normal.    Clinical Correlation: A normal EEG does not exclude a clinical diagnosis of  epilepsy.  If further clinical questions remain, prolonged EEG may be helpful.  Clinical correlation is advised.   Metta Clines, DO

## 2016-12-18 NOTE — Evaluation (Signed)
Speech Language Pathology Evaluation Patient Details Name: Janice [name] MRN: [id number] DOB: 08-12-1940 Today's Date: 12/18/2016 Time: 7371-0626 SLP Time Calculation (min) (ACUTE ONLY): 26 min  Problem List:  Patient Active Problem List   Diagnosis Date Noted  . Headache 12/17/2016  . Confusion 12/17/2016  . Hypothyroidism 12/17/2016  . Acute ischemic stroke ([place]) 12/17/2016  . Ischemic stroke of frontal lobe ([place]) 12/16/2016   Past Medical History:  Past Medical History:  Diagnosis Date  . Head injury   . Thyroid disease    Past Surgical History:  Past Surgical History:  Procedure Laterality Date  . ABDOMINAL HYSTERECTOMY    . APPENDECTOMY    . BRAIN SURGERY    . COLON SURGERY     HPI:  Pt is a 76 y.o. female with a past medical history significant for mild cognitive impairment, hypothyroidism, head trauma, and brain surgery. She was initially seen at [facility] due to increasing confusion, slurred speech, R face droop, visual changes, and imbalance, and was transferred to our facility for further evaluation. 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   Assessment / Plan / Recommendation Clinical Impression  Upon SLP arrival, pt was talking fluently and participating in simple cognitive tasks well. She was oriented x4. However, as session continued, she started to have an episode of expressive > receptive aphasia. Pt displayed difficulty with anomia, semantic paraphasias, inability to complete confrontational naming tasks, and was unable to say her full name. Min-Mod cues were then needed for completion of one-step commands. RN was notified and came to the room to assess the pt and page MD. Pt was left with RN as her verbal output began to improve (although it did not return to baseline). Pt will benefit from additional SLP f/u targeting linguistic abilities and differential diagnosis of cognitive skills.     SLP Assessment  SLP Recommendation/Assessment:  Patient needs continued Speech Lanaguage Pathology Services SLP Visit Diagnosis: Aphasia (R47.01)    Follow Up Recommendations  Home health SLP;24 hour supervision/assistance    Frequency and Duration min 2x/week  2 weeks      SLP Evaluation Cognition  Overall Cognitive Status: Difficult to assess (pt started having episode of aphasia during eval) Arousal/Alertness: Awake/alert Orientation Level: Oriented X4 Attention: Sustained Sustained Attention: Appears intact Awareness: Appears intact       Comprehension  Auditory Comprehension Overall Auditory Comprehension: Impaired Commands: Impaired One Step Basic Commands: 50-74% accurate    Expression Expression Primary Mode of Expression: Verbal Verbal Expression Overall Verbal Expression: Impaired Initiation: No impairment Level of Generative/Spontaneous Verbalization: Phrase Repetition: Impaired Level of Impairment: Word level Naming: Impairment Confrontation: Impaired Verbal Errors: Semantic paraphasias;Aware of errors;Other (comment) (anomia)   Oral / Motor  Oral Motor/Sensory Function Overall Oral Motor/Sensory Function: Within functional limits Motor Speech Overall Motor Speech: Appears within functional limits for tasks assessed   GO                    Germain Osgood 12/18/2016, 12:02 PM  Germain Osgood, M.A. CCC-SLP [phone]

## 2016-12-18 NOTE — Progress Notes (Signed)
PROGRESS NOTE    Janice [name]  MHD:[id number] DOB: 29-Sep-1940 DOA: 12/16/2016 PCP: Patient, No Pcp Per    Brief Narrative:  76 y.o.femalewith a past medical history significant for mild cognitive impairment, hypothyroidism, head trauma, and brain surgery, transferred from [facility] for further stroke evaluation.  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  Assessment & Plan:   Principal Problem:   Ischemic stroke of frontal lobe [facility]) - Neurology managing and recommended the following:  Resultant - headache resolved  CT head - unremarkable  MRI head - no acute findings. 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  Carotid Doppler - 1-39% ICA plaquing. Vertebral artery flow is antegrade. Left ICA is tortuous in the mid portion, resulting in elevated velocities, however, plaque morphology does not support significant stenosis.   2D Echo - reviewed and reporting normal EF with grade 1 DD  LDL - 77  HgbA1c - 5.1  VTE prophylaxis - Lovenox  Diet NPO time specified  No antithrombotic prior to admission, now on aspirin 81 mg daily  Patient counseled to be compliant with her antithrombotic medications  Ongoing aggressive stroke risk factor management  Therapy recommendations: pending  Disposition: Pending  Patient is not on hypertensive medication at home.  - Patient had episode of difficulty with speech and neurology decided to keep patient longer for further work up. EEG ordered.  Hyperlipidemia - LDL 77, goal < 70 - now on lipitor.  Active Problems:     Confusion   Hypothyroidism - continue thyroid replacement therapy - TSH elevated. Would need to see if patient has been complaint with medication at home prior to increasing home medication regimen.  DVT prophylaxis: Lovenox Code Status: Full Family Communication: spoke with daughter at  bedside. Disposition Plan: pending stroke work up   Consultants:   Neurology   Procedures: stroke work up   Antimicrobials: none   Subjective: Pt has no new complaints of any neurological deficits.   Objective: Vitals:   12/18/16 1026 12/18/16 1104 12/18/16 1108 12/18/16 1347  BP: 119/66 132/63 (!) 120/93 122/63  Pulse: 75 75 72 69  Resp: 16 15 15 18   Temp: 98.4 F (36.9 C) 98.9 F (37.2 C)  97.8 F (36.6 C)  TempSrc: Oral Oral  Oral  SpO2: 97% 97% 97%   Weight:      Height:        Intake/Output Summary (Last 24 hours) at 12/18/16 1700 Last data filed at 12/18/16 1300  Gross per 24 hour  Intake              560 ml  Output                0 ml  Net              560 ml   Filed Weights   12/17/16 0030  Weight: 61.7 kg (136 lb)    Examination:  General exam: Pt in nad, alert and awake Respiratory system: Respiratory effort normal. No wheezes, Clear to auscultation.  Cardiovascular system: RRR. No JVD, murmurs, rubs,S1 & S2 heard,  Gastrointestinal system: Abdomen is nondistended, soft and nontender. No organomegaly or masses felt. Normal bowel sounds heard. Central nervous system: Answers questions appropriately, moves extremities equally Extremities: no cyanosis Skin: No rashes, lesions or ulcers, on limited exam. Psychiatry: Mood & affect appropriate.   Data Reviewed: I have personally  reviewed following labs and imaging studies  CBC:  Recent Labs Lab 12/16/16 1452  WBC 5.9  NEUTROABS 3.0  HGB 13.5  HCT 39.5  MCV 95.2  PLT 485   Basic Metabolic Panel:  Recent Labs Lab 12/16/16 1452  NA 139  K 3.6  CL 103  CO2 26  GLUCOSE 94  BUN 14  CREATININE 0.68  CALCIUM 9.2   GFR: Estimated Creatinine Clearance: 51.7 mL/min (by C-G formula based on SCr of 0.68 mg/dL). Liver Function Tests:  Recent Labs Lab 12/16/16 1452  AST 24  ALT 16  ALKPHOS 39  BILITOT 0.6  PROT 6.8  ALBUMIN 3.9   No results for input(s): LIPASE, AMYLASE in the  last 168 hours.  Recent Labs Lab 12/16/16 1452  AMMONIA 13   Coagulation Profile: No results for input(s): INR, PROTIME in the last 168 hours. Cardiac Enzymes: No results for input(s): CKTOTAL, CKMB, CKMBINDEX, TROPONINI in the last 168 hours. BNP (last 3 results) No results for input(s): PROBNP in the last 8760 hours. HbA1C:  Recent Labs  12/17/16 0400  HGBA1C 5.1   CBG: No results for input(s): GLUCAP in the last 168 hours. Lipid Profile:  Recent Labs  12/17/16 0400  CHOL 181  HDL 88  LDLCALC 77  TRIG 80  CHOLHDL 2.1   Thyroid Function Tests:  Recent Labs  12/17/16 0640 12/18/16 1103  TSH 6.945*  --   FREET4  --  0.71   Anemia Panel: No results for input(s): VITAMINB12, FOLATE, FERRITIN, TIBC, IRON, RETICCTPCT in the last 72 hours. Sepsis Labs: No results for input(s): PROCALCITON, LATICACIDVEN in the last 168 hours.  No results found for this or any previous visit (from the past 240 hour(s)).       Radiology Studies: Mr Brain Wo Contrast  Result Date: 12/16/2016 CLINICAL DATA:  Severe frontal headache, feeling unwell for 2 days. History of dementia, multiple head injuries. EXAM: MRI HEAD WITHOUT CONTRAST TECHNIQUE: Multiplanar, multiecho pulse sequences of the brain and surrounding structures were obtained without intravenous contrast. COMPARISON:  CT HEAD December 16, 2016 at 1530 hours FINDINGS: Mild motion degraded examination. BRAIN: 7 mm focus reduced diffusion LEFT frontal white matter with slight T2 shine through. Ventricles and sulci are normal for patient's age. Additional minimal chronic small vessel ischemic disease, less than expected for age. 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 No susceptibility artifact to suggest hemorrhage. No midline shift, mass effect or masses. No susceptibility artifact to suggest hemorrhage. The ventricles and sulci are normal for patient's age. No suspicious parenchymal signal, mass or mass effect. No abnormal  extra-axial fluid collections. VASCULAR: Normal major intracranial vascular flow voids present at skull base. SKULL AND UPPER CERVICAL SPINE: No abnormal sellar expansion. No suspicious calvarial bone marrow signal. Craniocervical junction maintained. SINUSES/ORBITS: The mastoid air-cells and included paranasal sinuses are well-aerated. The included ocular globes and orbital contents are non-suspicious. OTHER: None. IMPRESSION: 1. Mild motion degraded examination. 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 2. Otherwise negative noncontrast MRI of the head for age. Electronically Signed   By: Elon Alas M.D.   On: 12/16/2016 18:58   Mr Jodene Nam Head/brain IO Cm  Result Date: 12/17/2016 CLINICAL DATA:  Frontal headache and confusion.  Acute infarct. EXAM: MRA HEAD WITHOUT CONTRAST TECHNIQUE: Angiographic images of the Circle of Willis were obtained using MRA technique without intravenous contrast. COMPARISON:  Brain MRI from yesterday FINDINGS: Symmetric carotid and vertebral arteries. Incomplete circle-of-Willis at the right posterior communicating artery. Branches are  smooth and widely patent. Bulbous appearance of the ICA at the right anterior genu, indeterminate between atheromatous irregularity and a sessile 2 mm aneurysm directed laterally. IMPRESSION: 1. No stenosis or embolic source to explain the acute infarct. 2. 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 Electronically Signed   By: Monte Fantasia M.D.   On: 12/17/2016 09:19     Scheduled Meds: .  stroke: mapping our early stages of recovery book   Does not apply Once  . aspirin EC  81 mg Oral Daily  . atorvastatin  40 mg Oral q1800  . enoxaparin (LOVENOX) injection  40 mg Subcutaneous Daily  . thyroid  30 mg Oral QAC breakfast   Continuous Infusions:   LOS: 1 day    Time spent: > 36 minutes  Velvet Bathe, MD [facility] Pager [phone]  If 7PM-7AM, please contact night-coverage [email or web address] Password [email or web address] 12/18/2016, 5:00 PM

## 2016-12-18 NOTE — Progress Notes (Signed)
Patient with another episode of speech difficulty. SLP was in room when patient's speech difficulties started, RN called to room. Per SLP, patient was able to say her first name but not her last name. Upon assessment, patient's speech worse than previous episode, this episode lasting about 10 minutes. Patient laid down flat. MD notified. See new orders.  Patient's speech returned to baseline.

## 2016-12-19 LAB — RPR: RPR Ser Ql: NONREACTIVE

## 2016-12-19 LAB — T3, FREE: T3, Free: 3.7 pg/mL (ref 2.0–4.4)

## 2016-12-19 MED ORDER — ASPIRIN 81 MG PO TBEC: 81.0000 mg | DELAYED_RELEASE_TABLET | Freq: Every day | ORAL | 0 refills | Status: DC

## 2016-12-19 MED ORDER — ATORVASTATIN CALCIUM 40 MG PO TABS: 40.0000 mg | ORAL_TABLET | Freq: Every day | ORAL | 0 refills | Status: DC

## 2016-12-19 NOTE — Progress Notes (Signed)
STROKE TEAM PROGRESS NOTE   HISTORY OF PRESENT ILLNESS (per record) Janice [name] is an 76 y.o. female with a past medical history significant for mild cognitive impairment, hypothyroidism, head trauma, and brain surgery, transferred from [facility] for further stroke evaluation. Patient daughter is at the bedside and said that Janice [name] is very functional and still capable of living independently despite of having some cognitive changes and frequent falls.  Stated that in the past two days her mother has been definitely more confused, complaining of HA, and today she also noticed slurred speech, some R face droop, and imbalance.  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 Presently, she offers no neurological complaints. Serologies and UA are unimpressive.  Date last known well: 2-3 days ago Time last known well: unable to determine. tPA Given: no, out of the window NIHSS: 2 Janice: 0   SUBJECTIVE (INTERVAL HISTORY) The patient was seen in the room with her daughter.She seems sleepy from ativan she got last night for MRI but has not had any more speech difficulty. MRI scan of the brain with and without contrast was normal. EEG showed no evidence of seizure activity.  OBJECTIVE Temp:  [97.8 F (36.6 C)-98.2 F (36.8 C)] 97.8 F (36.6 C) (07/10 1000) Pulse Rate:  [68-86] 68 (07/10 1000) Cardiac Rhythm: Normal sinus rhythm (07/10 0716) Resp:  [18] 18 (07/10 1000) BP: (108-146)/(57-88) 113/57 (07/10 1000) SpO2:  [94 %-95 %] 95 % (07/10 0626)  CBC:   Recent Labs Lab 12/16/16 1452  WBC 5.9  NEUTROABS 3.0  HGB 13.5  HCT 39.5  MCV 95.2  PLT 361    Basic Metabolic Panel:   Recent Labs Lab 12/16/16 1452  NA 139  K 3.6  CL 103  CO2 26  GLUCOSE 94  BUN 14  CREATININE 0.68  CALCIUM 9.2    Lipid Panel:     Component  Value Date/Time   CHOL 181 12/17/2016 0400   TRIG 80 12/17/2016 0400   HDL 88 12/17/2016 0400   CHOLHDL 2.1 12/17/2016 0400   VLDL 16 12/17/2016 0400   LDLCALC 77 12/17/2016 0400   HgbA1c:  Lab Results  Component Value Date   HGBA1C 5.1 12/17/2016   Urine Drug Screen: No results found for: LABOPIA, COCAINSCRNUR, LABBENZ, AMPHETMU, THCU, LABBARB  Alcohol Level     Component Value Date/Time   ETH <5 12/16/2016 1452    IMAGING  Dg Chest 2 View 12/16/2016 Aortic atherosclerosis.  No edema or consolidation.    Ct Head Wo Contrast 12/16/2016 No intracranial mass, hemorrhage, or extra-axial fluid collection. Gray-white compartments appear normal. There is slight right carotid siphon calcification. There is mild ethmoid sinus disease bilaterally.    Mr Brain Wo Contrast 12/16/2016 1. Mild motion degraded examination. 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  2. Otherwise negative noncontrast MRI of the head for age.   MRA Head 12/17/2016 1. No stenosis or embolic source to explain the acute infarct. 2. 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  PHYSICAL EXAM Pleasant elderly caucasian lady not in distress. . Afebrile. Head is nontraumatic. Neck is supple without bruit.    Cardiac exam no murmur or gallop. Lungs are clear to auscultation. Distal pulses are well felt.  Neurological Exam ;  Awake  Alert oriented  x 3.  Slow but clear speech today.eye movements full without nystagmus.fundi were not visualized. Vision acuity and fields appear normal. Hearing is normal. Palatal movements are normal. Face symmetric. Tongue midline. Normal strength, tone, reflexes and coordination. Normal sensation. Gait deferred.     ASSESSMENT/PLAN Janice [name] is a 76 y.o. female with history of thyroid disease, previous stroke and a history of a head injury with subsequent surgery presenting with headache, increased  confusion, slurred speech, right facial droop, and imbalance. She did not receive IV t-PA due to late presentation.  New onset headache which has resolved of unclear etiology. MRI shows a possible tiny silent subacute left frontal white matter infarct: From small vessel disease. His recurrent episodes on 12/18/16 of transient speech difficulties of unclear etiology possible, but it migraine versus anxiety related    Resultant - headache resolved  CT head - unremarkable  MRI head - no acute findings. 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  Carotid Doppler - 1-39% ICA plaquing.  Vertebral artery flow is antegrade.  Left ICA is tortuous in the mid portion, resulting in elevated velocities, however, plaque morphology does not support significant stenosis.  2D Echo - Left ventricle: The cavity size was normal. Systolic function was   normal. The estimated ejection fraction was in the range of 55%   to 60%. Wall motion was normal; there were no regional wall    motion abnormalities.  LDL - 77  HgbA1c - 5.1  VTE prophylaxis - Lovenox Diet regular Room service appropriate? Yes; Fluid consistency: Thin  No antithrombotic prior to admission, now on aspirin 81 mg daily  Patient counseled to be compliant with her antithrombotic medications  Ongoing aggressive stroke risk factor management  Therapy recommendations: HHOT  Disposition: home  Hypertension  Stable although blood pressure tends to run somewhat low. (Not on antihypertensive medications)  Permissive hypertension (OK if < 220/120) but gradually normalize in 5-7 days  Long-term BP goal normotensive  Hyperlipidemia  Home meds: No lipid lowering medications prior to admission  LDL 77, goal < 70  Now on Lipitor 40 mg daily  Continue statin at discharge    Other Stroke Risk Factors  Advanced age  Hx stroke/TIA - by imaging   Other Active  Problems  History of a head injury  Hospital day # 2  I have personally examined this patient, reviewed notes, independently viewed imaging studies, participated in medical decision making and plan of care.ROS completed by me personally and pertinent positives fully documented  I have made any additions or clarifications directly to the above note. She presented with isolated new onset headache which appears to have resolved and is of unclear etiology. MRI scan shows what appears to be a tiny punctate subacute left frontal white matter infarct which is clinically silent.  Today she had fluctuating transient speech difficulties exact etiology unclear partial seizures versus TIAs versus underlying anxiety. Recommend mobilize out of bed when more awake and discharged later today if stable. Discussed with Dr. Wendee Beavers., patient, daughter and  answered questions..  Follow-up as an outpatient in the stroke clinic in 6 weeks. Stroke team will sign off. Kindly call for questions. Antony Contras, MD Medical Director [facility] Pager: [phone] 12/19/2016 12:34 PM   To contact Stroke Continuity provider, please refer to [email or web address] After hours, contact General Neurology

## 2016-12-19 NOTE — Care Management Note (Signed)
Case Management Note  Patient Details  Name: Janice [name] MRN: [id number] Date of Birth: [birth year]  Subjective/Objective:                    Action/Plan: Plan is for patient to d/c home with self care. Pt and daughter interested in having outpatient OT and ST. CM met with them and daughter would like to have something in [place], [place]. CM attempted to find outpatient OT/ST in [place] without success. CM informed patients daughter and she was interested in attending [employer]. Orders in EPIC and information on AVS.  Daughter to provide transportation home.   Expected Discharge Date:                  Expected Discharge Plan:  OP Rehab  In-House Referral:     Discharge planning Services  CM Consult  Post Acute Care Choice:    Choice offered to:  Adult Children  DME Arranged:    DME Agency:     HH Arranged:    HH Agency:     Status of Service:  Completed, signed off  If discussed at [place] of Stay Meetings, dates discussed:    Additional Comments:  Pollie Friar, RN 12/19/2016, 11:55 AM

## 2016-12-19 NOTE — Progress Notes (Signed)
Pt. W/discharge orders. Discharge instructions reviewed with patient and family by the [facility]. Discharged via wheelchair

## 2016-12-19 NOTE — Progress Notes (Signed)
Occupational Therapy Treatment Patient Details Name: Janice [name] MRN: [id number] DOB: [birth year] Today's Date: 12/19/2016    History of present illness 76 y.o. female with a past medical history significant for mild cognitive impairment, hypothyroidism, head trauma, and brain surgery, initially seen at [facility] due to increasing confusion, slurred speech, R face droop, visual changes, and imbalance, and transferred to our facility for further evaluation and MRI bran revealed a small subacute to chronic L frontal lobe infarct; Transferred to [facility] for further evaluation   OT comments  Educated daughter/pt on safe tub transfer techniques and use of DME. Also educated on reducing risk of falls. Pt more unsteady this session - recommend pt use RW for mobility at this time. Daughter states they have a RW to use. Pt with apparent cognitive deficits with delayed recall, problem solving, safety/judgement, insight/awareness, impacting her ability to live safely alone at home. Pt will need 24/7 S after DC. Pt will need S with all medication and financial management. Daughter verbalized understanding. Daughter states pt continues to be more confused, which "they" believe is due to the Ativan pt had last night prior to her MRI. Contineut or ecommend rehab at the neuro outpt center.   Follow Up Recommendations  Outpatient OT;Supervision/Assistance - 24 hour    Equipment Recommendations  Tub/shower seat    Recommendations for Other Services      Precautions / Restrictions Precautions Precautions: Fall       Mobility Bed Mobility                  Transfers Overall transfer level: Needs assistance Equipment used: Rolling walker (2 wheeled) Transfers: Sit to/from [employer] Transfers Sit to Stand: Supervision Stand pivot transfers: Min guard            Balance Overall balance assessment: Needs assistance         Standing balance support: During functional activity Standing  balance-Leahy Scale: Poor Standing balance comment: losing balance when walking to bathroom. Improved with use of RW                           ADL either performed or assessed with clinical judgement   ADL Overall ADL's : Needs assistance/impaired     Grooming: Supervision/safety;Standing   Upper Body Bathing: Supervision/ safety;Sitting               Toilet Transfer: Minimal assistance;Ambulation   Toileting- Clothing Manipulation and Hygiene: Supervision/safety;Sit to/from stand   Tub/ Shower Transfer: Min guard;Ambulation;Rolling walker;3 in 1;Tub transfer Tub/Shower Transfer Details (indicate cue type and reason): vc for safe technique Functional mobility during ADLs: Min guard;Rolling walker;Cueing for safety;Cueing for sequencing General ADL Comments: Pt had difficulty following multipstep directions. Demonstrated safe [employer] for tub trasnfer with showerseat. Pt/daughter able to return demosntrate with min VC. Recommend pt not shower unless family present.      Vision   Additional Comments: wears glasses   Perception     Praxis      Cognition Arousal/Alertness: Awake/alert Behavior During Therapy: Flat affect Overall Cognitive Status: Impaired/Different from baseline Area of Impairment: Orientation;Attention;Memory;Following commands;Safety/judgement;Awareness;Problem solving                 Orientation Level: Disoriented to;Time Current Attention Level: Sustained Memory: Decreased recall of precautions;Decreased short-term memory Following Commands: Follows one step commands with increased time Safety/Judgement: Decreased awareness of safety;Decreased awareness of deficits Awareness: Emergent Problem Solving: Slow processing;Difficulty sequencing;Requires verbal cues General Comments: Daughter states  pt had Ativan last night prior to scan. Pt has not returned to baseline level prior to test.         Exercises     Shoulder  Instructions       General Comments      Pertinent Vitals/ Pain       Pain Assessment: No/denies pain  Home Living                                          Prior Functioning/Environment              Frequency  Min 2X/week        Progress Toward Goals  OT Goals(current goals can now be found in the care plan section)  Progress towards OT goals: Progressing toward goals  Acute Rehab OT Goals Patient Stated Goal: to be able to step in/out of her tub safely  OT Goal Formulation: With patient/family Time For Goal Achievement: 12/31/16 Potential to Achieve Goals: Good ADL Goals Pt Will Perform Grooming: with modified independence;standing Pt Will Perform Upper Body Bathing: with modified independence;sitting Pt Will Perform Lower Body Dressing: with modified independence;sit to/from stand Pt Will Transfer to Toilet: with modified independence;ambulating;regular height toilet Pt Will Perform Toileting - Clothing Manipulation and hygiene: with modified independence;sit to/from stand Pt Will Perform Tub/Shower Transfer: with supervision;Tub transfer;ambulating;3 in 1  Plan Discharge plan needs to be updated    Co-evaluation                 AM-PAC PT "6 Clicks" Daily Activity     Outcome Measure   Help from another person eating meals?: None Help from another person taking care of personal grooming?: A Little Help from another person toileting, which includes using toliet, bedpan, or urinal?: A Little Help from another person bathing (including washing, rinsing, drying)?: A Little Help from another person to put on and taking off regular upper body clothing?: None Help from another person to put on and taking off regular lower body clothing?: A Little 6 Click Score: 20    End of Session Equipment Utilized During Treatment: Gait belt;Rolling walker  OT Visit Diagnosis: Muscle weakness (generalized) (M62.81);Other symptoms and signs involving  cognitive function   Activity Tolerance Patient tolerated treatment well   Patient Left in chair;with call bell/phone within reach;with family/visitor present   Nurse Communication Mobility status        Time: 1350-1420 OT Time Calculation (min): 30 min  Charges: OT General Charges $OT Visit: 1 Procedure OT Treatments $Self Care/Home Management : 23-37 mins  [facility], OT/L  [phone] 12/19/2016   Keeven Matty,HILLARY 12/19/2016, 3:17 PM

## 2016-12-19 NOTE — Discharge Summary (Signed)
Physician Discharge Summary  Janice [name] DPO:[id number] DOB: 1941-02-21 DOA: 12/16/2016  PCP: Patient, No Pcp Per  Admit date: 12/16/2016 Discharge date: 12/19/2016  Time spent: > 35 minutes  Recommendations for Outpatient Follow-up:  1. Reassess thyroid levels within the next 4-6 weeks.   Discharge Diagnoses:  Principal Problem:   Ischemic stroke of frontal lobe [facility]) Active Problems:   Headache   Confusion   Hypothyroidism   Acute ischemic stroke [facility])   Discharge Condition: stable  Diet recommendation: regular diet  Filed Weights   12/17/16 0030  Weight: 61.7 kg (136 lb)    History of present illness:  76 y.o.femalewith a past medical history significant for mild cognitive impairment, hypothyroidism,head trauma, and brain surgery,transferred from [facility] further stroke evaluation.  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  Hospital Course:   stroke like symptoms - found to have subacute to old left frontal white matter infarct. - Neurology consulted and patient underwent stroke workup  ASSESSMENT/PLAN Janice [name] is a 76 y.o. female with history of thyroid disease, previous stroke and a history of a head injury with subsequent surgery presenting with headache, increased confusion, slurred speech, right facial droop, and imbalance. She did not receive IV t-PA due to late presentation.  New onset headache which has resolved of unclear etiology. MRI shows a possible tiny silent subacute left frontal white matter infarct: From small vessel disease. His recurrent episodes on 12/18/16 of transient speech difficulties of unclear etiology possible, but it migraine versus anxiety related    Resultant - headache resolved  CT head - unremarkable  MRI head - no acute findings. 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  Carotid Doppler -  1-39% ICA plaquing. Vertebral artery flow is antegrade. Left ICA is tortuous in the mid portion, resulting in elevated velocities, however, plaque morphology does not support significant stenosis.  2D Echo - Left ventricle: The cavity size was normal. Systolic function was normal. The estimated ejection fraction was in the range of 55% to 60%. Wall motion was normal; there were no regional wall  motion abnormalities.  LDL - 77  HgbA1c - 5.1  VTE prophylaxis - Lovenox  Diet regular Room service appropriate? Yes; Fluid consistency: Thin  No antithrombotic prior to admission, now on aspirin 81 mg daily  Patient counseled to be compliant with her antithrombotic medications  Ongoing aggressive stroke risk factor management  Therapy recommendations: HHOT  Disposition: home  Hypertension  Stable although blood pressure tends to run somewhat low. (Not on antihypertensive medications)              Permissive hypertension (OK if < 220/120) but gradually normalize in 5-7 days              Long-term BP goal normotensive  Hyperlipidemia  Home meds: No lipid lowering medications prior to admission  LDL 77, goal < 70  Now on Lipitor 40 mg daily  Continue statin at discharge    Other Stroke Risk Factors  Advanced age  Hx stroke/TIA - by imaging   Other Active Problems  History of a head injury   Procedures:  Please see above  Consultations: Neurology  Discharge Exam: Vitals:   12/19/16 1000 12/19/16 1300  BP: (!) 113/57 122/68  Pulse: 68 77  Resp: 18 18  Temp: 97.8 F (36.6 C) 99.2 F (37.3 C)    General: Pt  in nad, alert and awake Cardiovascular: rrr, no rubs Respiratory: no increased wob, no wheezes  Discharge Instructions   Discharge Instructions    Ambulatory referral to Occupational Therapy    Complete by:  As directed    Ambulatory referral to Speech Therapy    Complete by:  As directed    Call MD for:  difficulty breathing,  headache or visual disturbances    Complete by:  As directed    Call MD for:  severe uncontrolled pain    Complete by:  As directed    Call MD for:  temperature >100.4    Complete by:  As directed    Diet - low sodium heart healthy    Complete by:  As directed    Discharge instructions    Complete by:  As directed    Please continue follow-up with your primary care physician next one or 2 weeks or sooner should any new concerns arise.   Increase activity slowly    Complete by:  As directed      Current Discharge Medication List    START taking these medications   Details  aspirin EC 81 MG EC tablet Take 1 tablet (81 mg total) by mouth daily. Qty: 30 tablet, Refills: 0    atorvastatin (LIPITOR) 40 MG tablet Take 1 tablet (40 mg total) by mouth daily at 6 PM. Qty: 30 tablet, Refills: 0      CONTINUE these medications which have NOT CHANGED   Details  ARMOUR THYROID 30 MG tablet Take 30 mg by mouth daily before breakfast.    estradiol (ESTRACE) 0.5 MG tablet Take 0.5 mg by mouth daily.      STOP taking these medications     ibuprofen (ADVIL,MOTRIN) 200 MG tablet        Allergies  Allergen Reactions  . Tape Other (See Comments)    SKIN DEVELOPS WELTS WHERE TAPE HAS BEEN APPLIED/COBAN WRAP IS TOLERATED, so PLEASE use!!   Follow-up Information    [facility] Follow up.   Specialty:  Rehabilitation Why:  They will contact you for the first appointment Contact information: [place] [address] [phone]           The results of significant diagnostics from this hospitalization (including imaging, microbiology, ancillary and laboratory) are listed below for reference.    Significant Diagnostic Studies: Dg Chest 2 View  Result Date: 12/16/2016 CLINICAL DATA:  Headache and confusion EXAM: CHEST  2 VIEW COMPARISON:  None. FINDINGS: Lungs are clear. Heart size and  pulmonary vascularity are normal. No adenopathy. There is aortic atherosclerosis. There is degenerative change in the thoracic spine. IMPRESSION: Aortic atherosclerosis.  No edema or consolidation. Aortic Atherosclerosis (ICD10-I70.0). Electronically Signed   By: Lowella Grip III M.D.   On: 12/16/2016 15:43   Ct Head Wo Contrast  Result Date: 12/16/2016 CLINICAL DATA:  Frontal and occipital region headaches. Dizziness. Recent confusion. EXAM: CT HEAD WITHOUT CONTRAST TECHNIQUE: Contiguous axial images were obtained from the base of the skull through the vertex without intravenous contrast. COMPARISON:  None. FINDINGS: Brain: The ventricles and sulci appear normal in size and configuration for age. There is no evident intracranial mass, hemorrhage, extra-axial fluid collection, acute. A slightly asymmetric deep sulcus in the medial left temporal lobe is felt to represent an anatomic variant. No focal gray-white compartment lesions are evident. No acute infarct appreciable. Vascular: No appreciable hyperdense vessel. There is mild calcification in the right  carotid siphon region. Skull: Bony calvarium appears intact. Sinuses/Orbits: There is mild mucosal thickening in several ethmoid air cells. Other visualized paranasal sinuses are clear. Orbits appear symmetric bilaterally. Other: Mastoid air cells bilaterally are clear. IMPRESSION: No intracranial mass, hemorrhage, or extra-axial fluid collection. Gray-white compartments appear normal. There is slight right carotid siphon calcification. There is mild ethmoid sinus disease bilaterally. Electronically Signed   By: Lowella Grip III M.D.   On: 12/16/2016 15:43   Mr Brain Wo Contrast  Result Date: 12/16/2016 CLINICAL DATA:  Severe frontal headache, feeling unwell for 2 days. History of dementia, multiple head injuries. EXAM: MRI HEAD WITHOUT CONTRAST TECHNIQUE: Multiplanar, multiecho pulse sequences of the brain and surrounding structures were obtained  without intravenous contrast. COMPARISON:  CT HEAD December 16, 2016 at 1530 hours FINDINGS: Mild motion degraded examination. BRAIN: 7 mm focus reduced diffusion LEFT frontal white matter with slight T2 shine through. Ventricles and sulci are normal for patient's age. Additional minimal chronic small vessel ischemic disease, less than expected for age. 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 No susceptibility artifact to suggest hemorrhage. No midline shift, mass effect or masses. No susceptibility artifact to suggest hemorrhage. The ventricles and sulci are normal for patient's age. No suspicious parenchymal signal, mass or mass effect. No abnormal extra-axial fluid collections. VASCULAR: Normal major intracranial vascular flow voids present at skull base. SKULL AND UPPER CERVICAL SPINE: No abnormal sellar expansion. No suspicious calvarial bone marrow signal. Craniocervical junction maintained. SINUSES/ORBITS: The mastoid air-cells and included paranasal sinuses are well-aerated. The included ocular globes and orbital contents are non-suspicious. OTHER: None. IMPRESSION: 1. Mild motion degraded examination. 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 2. Otherwise negative noncontrast MRI of the head for age. Electronically Signed   By: Elon Alas M.D.   On: 12/16/2016 18:58   Mr Jeri Cos MH Contrast  Result Date: 12/18/2016 CLINICAL DATA:  76 y.o. female with a past medical history significant for mild cognitive impairment, hypothyroidism, and head trauma, transferred from [facility] for further stroke evaluation. EXAM: MRI HEAD WITHOUT AND WITH CONTRAST TECHNIQUE: Multiplanar, multiecho pulse sequences of the brain and surrounding structures were obtained without and with intravenous contrast. CONTRAST:  15 mL MULTIHANCE GADOBENATE DIMEGLUMINE 529 MG/ML IV SOLN COMPARISON:  CT head without contrast 12/16/2016. MR head without contrast 12/16/2016. MRA intracranial 12/17/2016. FINDINGS: Brain: No  acute infarction, hemorrhage, hydrocephalus, extra-axial collection or mass lesion. Chronic LEFT frontal subcortical white matter infarct redemonstrated. Generalized atrophy. Mild subcortical and periventricular T2 and FLAIR hyperintensities, likely chronic microvascular ischemic change. Unchanged tiny cerebellar infarcts. Tiny chronic RIGHT caudate lacunar infarct. Prominent perivascular spaces. Post infusion, no abnormal enhancement of the brain or meninges. Vascular: Flow voids are maintained throughout the carotid, basilar, and vertebral arteries. There are no areas of chronic hemorrhage. Skull and upper cervical spine: Normal pituitary and cerebellar tonsils. 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 Sinuses/Orbits: Negative. Other: None. IMPRESSION: Chronic changes as described. No acute intracranial abnormality. No abnormal postcontrast enhancement. 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 Electronically Signed   By: Staci Righter M.D.   On: 12/18/2016 19:14   Mr Jodene Nam Head/brain DQ Cm  Result Date: 12/17/2016 CLINICAL DATA:  Frontal headache and confusion.  Acute infarct. EXAM: MRA HEAD WITHOUT CONTRAST TECHNIQUE: Angiographic images of the Circle of Willis were obtained using MRA technique without intravenous contrast. COMPARISON:  Brain MRI from yesterday FINDINGS: Symmetric carotid and vertebral arteries. Incomplete circle-of-Willis at the right posterior communicating artery. Branches are smooth and widely patent. Bulbous appearance of the ICA at the right anterior genu, indeterminate between atheromatous irregularity and a sessile 2 mm aneurysm directed laterally. IMPRESSION: 1. No stenosis or embolic source to explain the acute infarct. 2. 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 Electronically Signed    By: Monte Fantasia M.D.   On: 12/17/2016 09:19    Microbiology: No results found for this or any previous visit (from the past 240 hour(s)).   Labs: Basic Metabolic Panel:  Recent Labs Lab 12/16/16 1452  NA 139  K 3.6  CL 103  CO2 26  GLUCOSE 94  BUN 14  CREATININE 0.68  CALCIUM 9.2   Liver Function Tests:  Recent Labs Lab 12/16/16 1452  AST 24  ALT 16  ALKPHOS 39  BILITOT 0.6  PROT 6.8  ALBUMIN 3.9   No results for input(s): LIPASE, AMYLASE in the last 168 hours.  Recent Labs Lab 12/16/16 1452  AMMONIA 13   CBC:  Recent Labs Lab 12/16/16 1452  WBC 5.9  NEUTROABS 3.0  HGB 13.5  HCT 39.5  MCV 95.2  PLT 165   Cardiac Enzymes: No results for input(s): CKTOTAL, CKMB, CKMBINDEX, TROPONINI in the last 168 hours. BNP: BNP (last 3 results) No results for input(s): BNP in the last 8760 hours.  ProBNP (last 3 results) No results for input(s): PROBNP in the last 8760 hours.  CBG: No results for input(s): GLUCAP in the last 168 hours.   Signed:  Velvet Bathe MD.  [facility] 12/19/2016, 5:27 PM

## 2016-12-19 NOTE — Progress Notes (Signed)
Pt's daughter will like to see the case management RN or social worker if pt can be d/c to rehab or assisted living facility for speech and rehab.  she can stay with pt for just one week and pt lives alone.

## 2016-12-20 LAB — VAS US CAROTID
LCCADDIAS: -19 cm/s
LCCADSYS: -58 cm/s
LEFT ECA DIAS: -10 cm/s
LEFT VERTEBRAL DIAS: -11 cm/s
LICADSYS: -66 cm/s
LICAPDIAS: 19 cm/s
LICAPSYS: 58 cm/s
Left CCA prox dias: 19 cm/s
Left CCA prox sys: 71 cm/s
Left ICA dist dias: -19 cm/s
RCCADSYS: -69 cm/s
RCCAPSYS: 71 cm/s
RIGHT ECA DIAS: -8 cm/s
RIGHT VERTEBRAL DIAS: 17 cm/s
Right CCA prox dias: 14 cm/s

## 2016-12-26 ENCOUNTER — Ambulatory Visit: Payer: Medicare PPO | Attending: Family Medicine | Admitting: Occupational Therapy

## 2016-12-26 ENCOUNTER — Ambulatory Visit: Payer: Medicare PPO | Admitting: Speech Pathology

## 2016-12-26 DIAGNOSIS — R41841 Cognitive communication deficit: Secondary | ICD-10-CM | POA: Diagnosis present

## 2016-12-26 DIAGNOSIS — R41844 Frontal lobe and executive function deficit: Secondary | ICD-10-CM

## 2016-12-26 DIAGNOSIS — I69318 Other symptoms and signs involving cognitive functions following cerebral infarction: Secondary | ICD-10-CM

## 2016-12-26 DIAGNOSIS — M6281 Muscle weakness (generalized): Secondary | ICD-10-CM | POA: Diagnosis present
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
## 2016-12-26 NOTE — Patient Instructions (Signed)
   Cognitive Activities you can do at home:   - [place] (easy level)  - [place]
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
## 2016-12-28 ENCOUNTER — Ambulatory Visit: Payer: Medicare PPO | Admitting: Occupational Therapy

## 2016-12-28 ENCOUNTER — Ambulatory Visit: Payer: Medicare PPO

## 2016-12-28 DIAGNOSIS — R41841 Cognitive communication deficit: Secondary | ICD-10-CM

## 2016-12-28 DIAGNOSIS — I69318 Other symptoms and signs involving cognitive functions following cerebral infarction: Secondary | ICD-10-CM

## 2016-12-28 DIAGNOSIS — R41844 Frontal lobe and executive function deficit: Secondary | ICD-10-CM

## 2016-12-28 NOTE — Patient Instructions (Signed)
  Please complete the assigned speech therapy homework prior to your next session and return it to the speech therapist at your next visit.  

## 2016-12-28 NOTE — Therapy (Signed)
[facility] [address] [place], [place], [address] Phone: [phone]   Fax:  [phone]  Speech Language Pathology Treatment  Patient Details  Name: Janice [name] MRN: [id number] Date of Birth: 1941/04/17 Referring Provider: Dr. Talbert Forest Corrington  Encounter Date: 12/28/2016      End of Session - 12/28/16 1309    Visit Number 2   Number of Visits 17   Date for SLP Re-Evaluation 03/09/17   SLP Start Time 1021   SLP Stop Time  1102   SLP Time Calculation (min) 41 min   Activity Tolerance Treatment limited secondary to agitation  (anxiety)      Past Medical History:  Diagnosis Date  . Head injury   . Thyroid disease     Past Surgical History:  Procedure Laterality Date  . ABDOMINAL HYSTERECTOMY    . APPENDECTOMY    . BRAIN SURGERY    . COLON SURGERY      There were no vitals filed for this visit.      Subjective Assessment - 12/28/16 1028    Subjective "We had to come today for some reason."   Patient is accompained by: --  daughter Kieth Brightly               ADULT SLP TREATMENT - 12/28/16 1043      General Information   Behavior/Cognition Alert;Confused;Pleasant mood;Impulsive;Distractible;Requires cueing     Treatment Provided   Treatment provided Cognitive-Linquistic     Cognitive-Linquistic Treatment   Treatment focused on Cognition   Skilled Treatment Pt with noted deficits in awareness and attention noted in conversation with SLP. With simple 5-step sequences with pictures targeting attention and attention to detail, pt req'd mod A consistently. Pt admitted she was anxious for today's session and that abilites/changes post CVA ("people always telling me I can't do this, I can't do that") has been difficult for her to align with. SLP provided verbal support for pt's feelings.     Assessment / Recommendations / Plan   Plan Continue with current plan of care     Progression Toward Goals    Progression toward goals Progressing toward goals          SLP Education - 12/28/16 1309    Education provided Yes   Education Details adjustments to post CVA changes   Person(s) Educated Patient;Child(ren)   Methods Explanation   Comprehension Verbalized understanding          SLP Short Term Goals - 12/28/16 1312      SLP SHORT TERM GOAL #1   Title Pt will selectivley attend t mildly complex cogntive linguistic task in distracting environment with 85% accuracy and occasional min A   Time 4   Period Weeks   Status On-going     SLP SHORT TERM GOAL #2   Title Pt will complete simple organization, reasoning and functional math problems with 90% accuracy and rare min A   Time 4   Period Weeks   Status On-going     SLP SHORT TERM GOAL #3   Title Pt will use external aids to manage her schedule and finances with occasional min A over 2 sessions or per family report   Time 4   Period Weeks   Status On-going     SLP SHORT TERM GOAL #4   Title Pt will use external aids to recall information told to her by family with occasional min A over 2 sessions or per family report  Time 4   Period Weeks   Status On-going          SLP Long Term Goals - 12/26/16 1227      SLP LONG TERM GOAL #1   Title Pt will alternate attention between 2 simple cognitive linguistic tasks with 85% on each and occasional min A   Time 8   Period Weeks   Status New     SLP LONG TERM GOAL #2   Title Pt will solve mildly complex organization, reasoning, functional math problems with 85% and occasional min A   Time 8   Period Weeks   Status New     SLP LONG TERM GOAL #3   Title Pt/family will report 50% reduction in pt's repeated request for the same information by using external aids/memory notebook over 2 sessions.   Time 8   Period Weeks   Status New          Plan - 12/28/16 1310    Clinical Impression Statement Pt presented with mod cognitive linguistic deficits in at least  awareness and attention today, and was admittedly anxious durign ST session. Pt would cont to benefit from skilled ST, moreso with decr'd anxiety, focusing on decr'd cognitive linguistic skills to decr caregiver burden and improve to as close to PLOF as possible.   Speech Therapy Frequency 2x / week   Duration --  8 weeks/17 total visits   Treatment/Interventions Functional tasks;Patient/family education;Compensatory strategies;Cognitive reorganization;Internal/external aids;SLP instruction and feedback;Cueing hierarchy   Potential to Achieve Goals Fair   Potential Considerations Ability to learn/carryover information;Previous level of function      Patient will benefit from skilled therapeutic intervention in order to improve the following deficits and impairments:   Cognitive communication deficit    Problem List Patient Active Problem List   Diagnosis Date Noted  . Headache 12/17/2016  . Confusion 12/17/2016  . Hypothyroidism 12/17/2016  . Acute ischemic stroke ([place]) 12/17/2016  . Ischemic stroke of frontal lobe ([place]) 12/16/2016    [facility] ,[place], [place]  12/28/2016, 1:13 PM  [facility] [address] [place], [place], [address] Phone: [phone]   Fax:  [phone]   Name: Janice [name] MRN: [id number] Date of Birth: Jul 23, 1940
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
## 2017-01-01 ENCOUNTER — Encounter: Payer: Self-pay | Admitting: Neurology

## 2017-01-01 ENCOUNTER — Telehealth: Payer: Self-pay | Admitting: Neurology

## 2017-01-01 NOTE — Telephone Encounter (Signed)
Patient is calling to find out if she has to go to neuro rehabilitation since being referred there from the hospital. The patient does not want to go there.
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
## 2017-01-30 ENCOUNTER — Encounter: Payer: Medicare PPO | Admitting: Speech Pathology

## 2017-01-30 ENCOUNTER — Ambulatory Visit: Payer: Medicare PPO | Admitting: Occupational Therapy

## 2017-02-01 ENCOUNTER — Encounter: Payer: Medicare PPO | Admitting: Occupational Therapy

## 2017-02-02 ENCOUNTER — Ambulatory Visit: Payer: Medicare PPO | Admitting: Pediatrics

## 2017-02-06 ENCOUNTER — Encounter: Payer: Medicare PPO | Admitting: Occupational Therapy

## 2017-02-06 ENCOUNTER — Encounter: Payer: Medicare PPO | Admitting: Speech Pathology

## 2017-02-08 ENCOUNTER — Ambulatory Visit: Payer: Self-pay | Admitting: Neurology

## 2017-02-08 ENCOUNTER — Encounter: Payer: Medicare PPO | Admitting: Speech Pathology

## 2017-02-08 ENCOUNTER — Encounter: Payer: Medicare PPO | Admitting: Occupational Therapy

## 2017-02-13 ENCOUNTER — Encounter: Payer: Medicare PPO | Admitting: Speech Pathology

## 2017-02-13 ENCOUNTER — Encounter: Payer: Medicare PPO | Admitting: Occupational Therapy

## 2017-02-15 ENCOUNTER — Encounter: Payer: Medicare PPO | Admitting: Occupational Therapy

## 2017-02-15 ENCOUNTER — Encounter: Payer: Medicare PPO | Admitting: Speech Pathology

## 2017-02-20 ENCOUNTER — Encounter: Payer: Medicare PPO | Admitting: Speech Pathology

## 2017-02-20 ENCOUNTER — Encounter: Payer: Medicare PPO | Admitting: Occupational Therapy

## 2017-02-22 ENCOUNTER — Encounter: Payer: Medicare PPO | Admitting: Occupational Therapy

## 2017-02-22 ENCOUNTER — Encounter: Payer: Medicare PPO | Admitting: Speech Pathology

## 2017-02-26 ENCOUNTER — Encounter: Payer: Self-pay | Admitting: Occupational Therapy

## 2017-02-26 NOTE — Therapy (Signed)
[facility] [address] [place], [place], [address] Phone: [phone]   Fax:  [phone]  Patient Details  Name: Janice [name] MRN: [id number] Date of Birth: 1940/10/26 Referring Provider:  Dr. Leonie Man Encounter Date: 02/26/2017  Pt cancelled all appointments after 2nd visit on 12/28/16 and did not wish to return. Therefore will d/c episode of care at this time.    Carey Bullocks, OTR/L 02/26/2017, 11:48 AM  [facility] [address] [place], [place], [address] Phone: [phone]   Fax:  [phone]

## 2017-02-27 ENCOUNTER — Encounter: Payer: Medicare PPO | Admitting: Speech Pathology

## 2017-02-27 ENCOUNTER — Encounter: Payer: Medicare PPO | Admitting: Occupational Therapy

## 2017-03-01 ENCOUNTER — Encounter: Payer: Medicare PPO | Admitting: Speech Pathology

## 2017-03-01 ENCOUNTER — Encounter: Payer: Medicare PPO | Admitting: Occupational Therapy

## 2017-03-06 ENCOUNTER — Encounter: Payer: Medicare PPO | Admitting: Speech Pathology

## 2017-03-08 ENCOUNTER — Encounter: Payer: Medicare PPO | Admitting: Speech Pathology

## 2017-03-13 ENCOUNTER — Encounter: Payer: Medicare PPO | Admitting: Speech Pathology

## 2017-03-15 ENCOUNTER — Encounter: Payer: Medicare PPO | Admitting: Speech Pathology

## 2017-03-31 ENCOUNTER — Other Ambulatory Visit: Payer: Self-pay

## 2018-02-14 ENCOUNTER — Encounter (HOSPITAL_COMMUNITY): Payer: Self-pay | Admitting: Radiology

## 2018-02-14 ENCOUNTER — Ambulatory Visit (HOSPITAL_COMMUNITY)
Admission: RE | Admit: 2018-02-14 | Discharge: 2018-02-14 | Disposition: A | Discharge status: Home / Self Care | Payer: Medicare PPO | Source: Ambulatory Visit | Attending: Gastroenterology | Admitting: Gastroenterology

## 2018-02-14 ENCOUNTER — Other Ambulatory Visit: Payer: Self-pay | Admitting: Gastroenterology

## 2018-02-14 DIAGNOSIS — R1032 Left lower quadrant pain: Secondary | ICD-10-CM

## 2018-02-14 DIAGNOSIS — R933 Abnormal findings on diagnostic imaging of other parts of digestive tract: Secondary | ICD-10-CM | POA: Insufficient documentation

## 2018-02-14 DIAGNOSIS — R918 Other nonspecific abnormal finding of lung field: Secondary | ICD-10-CM | POA: Diagnosis not present

## 2018-02-14 DIAGNOSIS — I700 Atherosclerosis of aorta: Secondary | ICD-10-CM | POA: Insufficient documentation

## 2018-02-14 DIAGNOSIS — Z9049 Acquired absence of other specified parts of digestive tract: Secondary | ICD-10-CM | POA: Diagnosis not present

## 2018-02-14 LAB — POCT I-STAT CREATININE: Creatinine, Ser: 0.6 mg/dL (ref 0.44–1.00)

## 2018-02-14 MED ORDER — IOPAMIDOL (ISOVUE-300) INJECTION 61%
INTRAVENOUS | Status: AC
  Filled 2018-02-14: qty 30

## 2018-02-14 MED ORDER — IOPAMIDOL (ISOVUE-300) INJECTION 61%
30.0000 mL | Freq: Once | INTRAVENOUS | Status: AC | PRN
  Administered 2018-02-14: 30 mL via ORAL

## 2018-02-14 MED ORDER — IOHEXOL 300 MG/ML  SOLN
100.0000 mL | Freq: Once | INTRAMUSCULAR | Status: AC | PRN
  Administered 2018-02-14: 100 mL via INTRAVENOUS

## 2018-02-15 ENCOUNTER — Other Ambulatory Visit: Payer: Self-pay | Admitting: Gastroenterology

## 2018-02-15 DIAGNOSIS — R933 Abnormal findings on diagnostic imaging of other parts of digestive tract: Secondary | ICD-10-CM

## 2018-02-16 ENCOUNTER — Ambulatory Visit (HOSPITAL_COMMUNITY)
Admission: RE | Admit: 2018-02-16 | Discharge: 2018-02-16 | Disposition: A | Discharge status: Home / Self Care | Payer: Medicare PPO | Source: Ambulatory Visit | Attending: Gastroenterology | Admitting: Gastroenterology

## 2018-02-16 DIAGNOSIS — K869 Disease of pancreas, unspecified: Secondary | ICD-10-CM | POA: Diagnosis not present

## 2018-02-16 DIAGNOSIS — R933 Abnormal findings on diagnostic imaging of other parts of digestive tract: Secondary | ICD-10-CM

## 2018-02-16 MED ORDER — GADOBUTROL 1 MMOL/ML IV SOLN
6.0000 mL | Freq: Once | INTRAVENOUS | Status: AC | PRN
  Administered 2018-02-16: 6 mL via INTRAVENOUS

## 2018-02-19 ENCOUNTER — Other Ambulatory Visit: Payer: Self-pay

## 2018-02-19 ENCOUNTER — Ambulatory Visit (HOSPITAL_COMMUNITY): Payer: Medicare PPO | Admitting: Anesthesiology

## 2018-02-19 ENCOUNTER — Encounter (HOSPITAL_COMMUNITY): Payer: Self-pay | Admitting: *Deleted

## 2018-02-19 ENCOUNTER — Encounter (HOSPITAL_COMMUNITY)
Admission: RE | Disposition: A | Discharge status: Home / Self Care | Payer: Self-pay | Source: Ambulatory Visit | Attending: Gastroenterology

## 2018-02-19 ENCOUNTER — Ambulatory Visit (HOSPITAL_COMMUNITY)
Admission: RE | Admit: 2018-02-19 | Discharge: 2018-02-19 | Disposition: A | Discharge status: Home / Self Care | Payer: Medicare PPO | Source: Ambulatory Visit | Attending: Gastroenterology | Admitting: Gastroenterology

## 2018-02-19 DIAGNOSIS — R109 Unspecified abdominal pain: Secondary | ICD-10-CM | POA: Diagnosis present

## 2018-02-19 DIAGNOSIS — Z8673 Personal history of transient ischemic attack (TIA), and cerebral infarction without residual deficits: Secondary | ICD-10-CM | POA: Insufficient documentation

## 2018-02-19 DIAGNOSIS — C257 Malignant neoplasm of other parts of pancreas: Secondary | ICD-10-CM | POA: Diagnosis not present

## 2018-02-19 HISTORY — PX: FINE NEEDLE ASPIRATION: SHX5430

## 2018-02-19 HISTORY — PX: UPPER ESOPHAGEAL ENDOSCOPIC ULTRASOUND (EUS): SHX6562

## 2018-02-19 SURGERY — UPPER ESOPHAGEAL ENDOSCOPIC ULTRASOUND (EUS)
Anesthesia: Monitor Anesthesia Care

## 2018-02-19 MED ORDER — PROPOFOL 10 MG/ML IV BOLUS
INTRAVENOUS | Status: AC
  Filled 2018-02-19: qty 40

## 2018-02-19 MED ORDER — ONDANSETRON HCL 4 MG/2ML IJ SOLN
INTRAMUSCULAR | Status: DC | PRN
  Administered 2018-02-19: 4 mg via INTRAVENOUS

## 2018-02-19 MED ORDER — PROPOFOL 10 MG/ML IV BOLUS
INTRAVENOUS | Status: AC
  Filled 2018-02-19: qty 20

## 2018-02-19 MED ORDER — LIDOCAINE 2% (20 MG/ML) 5 ML SYRINGE
INTRAMUSCULAR | Status: DC | PRN
  Administered 2018-02-19: 100 mg via INTRAVENOUS

## 2018-02-19 MED ORDER — SODIUM CHLORIDE 0.9 % IV SOLN: INTRAVENOUS | Status: DC

## 2018-02-19 MED ORDER — LACTATED RINGERS IV SOLN
INTRAVENOUS | Status: DC
  Administered 2018-02-19: 1000 mL via INTRAVENOUS

## 2018-02-19 MED ORDER — PROPOFOL 500 MG/50ML IV EMUL
INTRAVENOUS | Status: DC | PRN
  Administered 2018-02-19: 200 ug/kg/min via INTRAVENOUS

## 2018-02-19 MED ORDER — PROPOFOL 10 MG/ML IV BOLUS
INTRAVENOUS | Status: DC | PRN
  Administered 2018-02-19 (×2): 10 mg via INTRAVENOUS
  Administered 2018-02-19: 20 mg via INTRAVENOUS
  Administered 2018-02-19 (×2): 10 mg via INTRAVENOUS

## 2018-02-19 NOTE — Discharge Instructions (Signed)

## 2018-02-19 NOTE — Anesthesia Preprocedure Evaluation (Signed)
Anesthesia Evaluation  Patient identified by MRN, date of birth, ID band Patient awake    Reviewed: Allergy & Precautions, NPO status , Patient's Chart, lab work & pertinent test results  Airway Mallampati: II  TM Distance: >3 FB Neck ROM: Full    Dental   Pulmonary neg pulmonary ROS,    breath sounds clear to auscultation       Cardiovascular negative cardio ROS   Rhythm:Regular Rate:Normal     Neuro/Psych CVA    GI/Hepatic negative GI ROS, Neg liver ROS,   Endo/Other  Hypothyroidism   Renal/GU negative Renal ROS     Musculoskeletal   Abdominal   Peds  Hematology negative hematology ROS (+)   Anesthesia Other Findings   Reproductive/Obstetrics                             Anesthesia Physical Anesthesia Plan  ASA: III  Anesthesia Plan: MAC   Post-op Pain Management:    Induction: Intravenous  PONV Risk Score and Plan: 2 and Propofol infusion, Ondansetron and Treatment may vary due to age or medical condition  Airway Management Planned: Natural Airway and Nasal Cannula  Additional Equipment:   Intra-op Plan:   Post-operative Plan:   Informed Consent: I have reviewed the patients History and Physical, chart, labs and discussed the procedure including the risks, benefits and alternatives for the proposed anesthesia with the patient or authorized representative who has indicated his/her understanding and acceptance.     Plan Discussed with: CRNA  Anesthesia Plan Comments:         Anesthesia Quick Evaluation

## 2018-02-19 NOTE — Anesthesia Procedure Notes (Signed)
Procedure Name: MAC Date/Time: 02/19/2018 1:21 PM Performed by: Maxwell Caul, CRNA Pre-anesthesia Checklist: Patient identified, Emergency Drugs available, Suction available and Patient being monitored Oxygen Delivery Method: Simple face mask

## 2018-02-19 NOTE — Op Note (Signed)
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 Estimated Blood Loss:     Estimated blood loss was minimal. Procedure:                Pre-Anesthesia Assessment:                           - Prior to the procedure, a History and Physical                            was performed, and patient medications and                            allergies were reviewed. The patient's tolerance of                            previous anesthesia was also reviewed. The risks                            and benefits of the procedure and the sedation                            options and risks were discussed with the patient.                            All questions were answered, and informed consent                            was obtained. Prior Anticoagulants: The patient has                            taken no previous anticoagulant or antiplatelet                            agents. ASA Grade Assessment: II - A patient with                            mild systemic disease. After reviewing the risks  and benefits, the patient was deemed in                            satisfactory condition to undergo the procedure.                           - Sedation was administered by an anesthesia                            professional. Deep sedation was attained.                           After obtaining informed consent, the endoscope was   passed under direct vision. Throughout the                            procedure, the patient's blood pressure, pulse, and                            oxygen saturations were monitored continuously. The                            [id number] Olympus Linear EUS was                            introduced through the mouth, and advanced to the                            second part of duodenum. The upper EUS was                            technically difficult and complex. The patient                            tolerated the procedure well. Scope In: Scope Out: Findings:      ENDOSONOGRAPHIC FINDING: :      A round mass was identified in the uncinate process of the pancreas. The       mass was hypoechoic. The mass measured 20 mm by 20 mm in maximal       cross-sectional diameter. The endosonographic borders were well-defined.       Fine needle aspiration for cytology was performed. Color Doppler imaging       was utilized prior to needle puncture to confirm a lack of significant       vascular structures within the needle path. Four passes were made with       the 25 gauge needle using a transduodenal approach. A stylet was used. A       cytotechnologist was present to evaluate the adequacy of the specimen.       The cellularity of the specimen was adequate. Final cytology results are       pending.      The mass was easily identified in the uncinate process of the pancreas.       It was a rounded 2 x 2 cm hypoechoic mass with a well-circumscribed       border. There was no evidence of  any vascular invasion. A narrow window       for FNA was obtained as there was a larger overlying vessel and       ectatic/dilated side branch ducts in the region. A combination of       torqing the scope and maneuvering the elevated allowed for the needle to       thread through this window. Adequate sampling was obtained. Close       observation with doppler after the FNA did now show any aberrant        bleeding and there was no bleeding in the lumen of the duodenum. The       upper intra-abdominal structures appeared to be normal. Impression:               - A mass was identified in the uncinate process of                            the pancreas. Fine needle aspiration performed. Moderate Sedation:      N/A- Per Anesthesia Care Recommendation:           - Patient has a contact number available for                            emergencies. The signs and symptoms of potential                            delayed complications were discussed with the                            patient. Return to normal activities tomorrow.                            Written discharge instructions were provided to the                            patient.                           - Resume previous diet.                           - Await cytology results. 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 All rights reserved. The codes documented in this report are preliminary and upon coder review may  be revised to meet current compliance requirements. Carol Ada, MD Carol Ada, MD 02/19/2018 2:31:32 PM This report has been signed electronically. Number of Addenda: 0

## 2018-02-19 NOTE — Transfer of Care (Signed)
Immediate Anesthesia Transfer of Care Note  Patient: Janice [name]  Procedure(s) Performed: UPPER ESOPHAGEAL ENDOSCOPIC ULTRASOUND (EUS) (N/A ) FINE NEEDLE ASPIRATION (FNA) LINEAR (N/A )  Patient Location: PACU  Anesthesia Type:MAC  Level of Consciousness: awake, alert  and oriented  Airway & Oxygen Therapy: Patient Spontanous Breathing and Patient connected to face mask oxygen  Post-op Assessment: Report given to RN and Post -op Vital signs reviewed and stable  Post vital signs: Reviewed and stable  Last Vitals:  Vitals Value Taken Time  BP    Temp    Pulse    Resp    SpO2      Last Pain:  Vitals:   02/19/18 1210  TempSrc: Oral  PainSc: 0-No pain         Complications: No apparent anesthesia complications

## 2018-02-19 NOTE — H&P (Signed)
  Janice [name] HPI: The patient complained about lower abdominal pain and a 2 cm lesion was found in the uncinate process of the pancreas.  She is here today for an EUS with FNA.  Past Medical History:  Diagnosis Date  . Head injury   . Thyroid disease     Past Surgical History:  Procedure Laterality Date  . ABDOMINAL HYSTERECTOMY    . APPENDECTOMY    . BRAIN SURGERY    . COLON SURGERY      History reviewed. No pertinent family history.  Social History:  reports that she has never smoked. She has never used smokeless tobacco. Her alcohol and drug histories are not on file.  Allergies:  Allergies  Allergen Reactions  . Tape Other (See Comments)    SKIN DEVELOPS WELTS WHERE TAPE HAS BEEN APPLIED/COBAN WRAP IS TOLERATED, so PLEASE use!!    Medications:  Scheduled:  Continuous: . sodium chloride    . lactated ringers 1,000 mL (02/19/18 1229)    No results found for this or any previous visit (from the past 24 hour(s)).   No results found.  ROS:  As stated above in the HPI otherwise negative.  Blood pressure (!) 154/63, pulse 64, temperature 97.9 F (36.6 C), temperature source Oral, resp. rate 16, height 5\' 2"  (1.575 m), weight 54 kg, SpO2 100 %.    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  Delta Deshmukh D 02/19/2018, 1:19 PM

## 2018-02-19 NOTE — Anesthesia Postprocedure Evaluation (Signed)
Anesthesia Post Note  Patient: Janice [name]  Procedure(s) Performed: UPPER ESOPHAGEAL ENDOSCOPIC ULTRASOUND (EUS) (N/A ) FINE NEEDLE ASPIRATION (FNA) LINEAR (N/A )     Patient location during evaluation: PACU Anesthesia Type: MAC Level of consciousness: awake and alert Pain management: pain level controlled Vital Signs Assessment: post-procedure vital signs reviewed and stable Respiratory status: spontaneous breathing, nonlabored ventilation and respiratory function stable Cardiovascular status: stable and blood pressure returned to baseline Postop Assessment: no apparent nausea or vomiting Anesthetic complications: no    Last Vitals:  Vitals:   02/19/18 1440 02/19/18 1455  BP: 94/64 (!) 151/54  Pulse: (!) 57 (!) 52  Resp: 15 15  Temp:    SpO2: 99% 100%    Last Pain:  Vitals:   02/19/18 1455  TempSrc:   PainSc: 1                  [name] A.

## 2018-02-21 ENCOUNTER — Encounter (HOSPITAL_COMMUNITY): Payer: Self-pay | Admitting: Gastroenterology

## 2018-02-25 ENCOUNTER — Other Ambulatory Visit: Payer: Self-pay | Admitting: Hematology

## 2018-02-25 DIAGNOSIS — C259 Malignant neoplasm of pancreas, unspecified: Secondary | ICD-10-CM

## 2018-02-26 ENCOUNTER — Inpatient Hospital Stay: Payer: Medicare PPO

## 2018-02-26 ENCOUNTER — Other Ambulatory Visit: Payer: Self-pay

## 2018-02-26 ENCOUNTER — Encounter: Payer: Self-pay | Admitting: Hematology

## 2018-02-26 ENCOUNTER — Inpatient Hospital Stay: Payer: Medicare PPO | Attending: Hematology | Admitting: Hematology

## 2018-02-26 VITALS — BP 146/49 | HR 61 | Temp 97.5°F | Resp 20 | Ht 62.0 in | Wt 122.8 lb

## 2018-02-26 DIAGNOSIS — Z7982 Long term (current) use of aspirin: Secondary | ICD-10-CM | POA: Diagnosis not present

## 2018-02-26 DIAGNOSIS — C259 Malignant neoplasm of pancreas, unspecified: Secondary | ICD-10-CM

## 2018-02-26 DIAGNOSIS — R918 Other nonspecific abnormal finding of lung field: Secondary | ICD-10-CM

## 2018-02-26 DIAGNOSIS — Z8673 Personal history of transient ischemic attack (TIA), and cerebral infarction without residual deficits: Secondary | ICD-10-CM | POA: Diagnosis not present

## 2018-02-26 DIAGNOSIS — K5900 Constipation, unspecified: Secondary | ICD-10-CM | POA: Insufficient documentation

## 2018-02-26 DIAGNOSIS — K7689 Other specified diseases of liver: Secondary | ICD-10-CM

## 2018-02-26 DIAGNOSIS — Z79899 Other long term (current) drug therapy: Secondary | ICD-10-CM | POA: Insufficient documentation

## 2018-02-26 DIAGNOSIS — I639 Cerebral infarction, unspecified: Secondary | ICD-10-CM

## 2018-02-26 LAB — CMP (CANCER CENTER ONLY)
ALK PHOS: 48 U/L (ref 38–126)
ALT: 15 U/L (ref 0–44)
AST: 22 U/L (ref 15–41)
Albumin: 3.9 g/dL (ref 3.5–5.0)
Anion gap: 9 (ref 5–15)
BILIRUBIN TOTAL: 0.4 mg/dL (ref 0.3–1.2)
BUN: 10 mg/dL (ref 8–23)
CALCIUM: 9.9 mg/dL (ref 8.9–10.3)
CO2: 27 mmol/L (ref 22–32)
Chloride: 106 mmol/L (ref 98–111)
Creatinine: 0.7 mg/dL (ref 0.44–1.00)
Glucose, Bld: 78 mg/dL (ref 70–99)
Potassium: 4.3 mmol/L (ref 3.5–5.1)
Sodium: 142 mmol/L (ref 135–145)
TOTAL PROTEIN: 7.1 g/dL (ref 6.5–8.1)

## 2018-02-26 LAB — CBC WITH DIFFERENTIAL (CANCER CENTER ONLY)
Basophils Absolute: 0 10*3/uL (ref 0.0–0.1)
Basophils Relative: 0 %
Eosinophils Absolute: 0 10*3/uL (ref 0.0–0.5)
Eosinophils Relative: 1 %
HCT: 41.1 % (ref 34.8–46.6)
HEMOGLOBIN: 13.7 g/dL (ref 11.6–15.9)
Lymphocytes Relative: 44 %
Lymphs Abs: 2.1 10*3/uL (ref 0.9–3.3)
MCH: 31.6 pg (ref 26.0–34.0)
MCHC: 33.3 g/dL (ref 32.0–36.0)
MCV: 94.7 fL (ref 81.0–101.0)
MONOS PCT: 10 %
Monocytes Absolute: 0.5 10*3/uL (ref 0.1–0.9)
NEUTROS ABS: 2.2 10*3/uL (ref 1.5–6.5)
NEUTROS PCT: 45 %
Platelet Count: 191 10*3/uL (ref 145–400)
RBC: 4.34 MIL/uL (ref 3.70–5.32)
RDW: 12.7 % (ref 11.1–15.7)
WBC: 4.9 10*3/uL (ref 3.9–10.0)

## 2018-02-26 LAB — HEPATIC FUNCTION PANEL
ALT: 16 U/L (ref 0–44)
AST: 24 U/L (ref 15–41)
Albumin: 4.1 g/dL (ref 3.5–5.0)
Alkaline Phosphatase: 42 U/L (ref 38–126)
Bilirubin, Direct: 0.1 mg/dL (ref 0.0–0.2)
Indirect Bilirubin: 0.4 mg/dL (ref 0.3–0.9)
TOTAL PROTEIN: 6.9 g/dL (ref 6.5–8.1)
Total Bilirubin: 0.5 mg/dL (ref 0.3–1.2)

## 2018-02-26 LAB — TSH: TSH: 2.81 u[IU]/mL (ref 0.308–3.960)

## 2018-02-26 LAB — CEA (IN HOUSE-CHCC): CEA (CHCC-IN HOUSE): 5.01 ng/mL — AB (ref 0.00–5.00)

## 2018-02-26 NOTE — Progress Notes (Signed)
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 Imaging evidence so far has not show any evidence of locally advanced or metastatic disease in the abdomen. Labs pending today, including CEA and CA19-9.  We will order CT chest with contrast to rule out any metastatic disease in the chest.  If imaging studies do not show any evidence of metastatic disease, then patient may be candidate for upfront surgery. Given her age and multiple comorbidities, she is at high risk for significant chemotherapy related toxicities.  I have referred patient to general surgery was for evaluation with Dr. Barry Dienes. I will see patient back in 1 week to review the imaging studies.   Ischemic stroke of frontal lobe [facility]) Patient has residual neurologic deficit at this time. Continue aspirin for stroke prophylaxis.   Orders Placed This Encounter  Procedures  . CT CHEST W CONTRAST    Standing Status:   Future    Standing Expiration Date:   02/26/2019    Order Specific Question:   ** REASON FOR EXAM (FREE TEXT)    Answer:   Staging CT chest to assess for any met; please schedule ASAP    Order Specific Question:   If indicated for the ordered procedure, I authorize the administration of contrast media per Radiology protocol    Answer:   Yes    Order Specific Question:   Preferred imaging location?    Answer:   [facility]    Comments:   First available     Order Specific Question:   Radiology Contrast Protocol - do NOT remove file path    Answer:   [email or web address]    A total of more than 40 minutes were spent face-to-face with the patient during this encounter and over half of that time was spent on counseling and coordination of care as outlined above. The total time  spent in the appointment was 60 minutes.   All questions were answered. The patient knows to call the clinic with any problems, questions or concerns.  Tish Men, MD 02/26/2018 10:50 AM   CHIEF COMPLAINTS/PURPOSE OF CONSULTATION:  "I am here for pancreatic cancer "  HISTORY OF PRESENTING ILLNESS:  Janice [name] 77 y.o. female is here because of recently diagnosed pancreatic adenocarcinoma.  Ms. Janice [name] reports that she first developed intermittent abdominal cramping/lower abdominal pain in early August 2019, associated with constipation.  She tried several different laxatives without improvement in her symptoms, and presented to her gastroenterologist for further evaluation.  Given her history of diverticulitis in 2016, her gastroenterologist ordered a CT abdomen pelvis to rule out acute diverticulitis.  A CT did not show any evidence of diverticulitis, it did demonstrate an incidental pancreatic mass measuring approximately 2 cm in the uncinate process.  She was subsequently referred to oncology for further evaluation.  With regard to her symptom at this time, she reports occasional para-umbilical discomfort and constipation, but she denies any fever, chills, unexplained weight loss, or night sweats. The patient denies any recent signs or symptoms of bleeding such as hematochezia or melena.   I have reviewed her chart and materials related to her cancer extensively and collaborated history with the patient. Summary of oncologic history is as follows:   Pancreatic adenocarcinoma ([place])   02/14/2018 Initial Diagnosis    CT abdomen/pelvis  w/ contrast: 2.3 x 2.0 x 1.0cm ill-defined mass arising from the uncinate process of the pancreas.  No involvement of the main SMA or SMV.  The mass does not make contact with multiple jejunal branches arising from the SMA.  No biliary obstruction.  Geographic hypoattenuation in the left hemi-liver on either side of the fissure for calcium formal ligament has  imaging appearance most suggestive of benign focal fatty liver infiltration.  Similarly, there are multiple tiny scattered pulmonary nodules, which likely represent old granulomatous disease.  Surgical changes of prior sigmoid colectomy and colonic anastomosis without evidence of complications.    02/16/2018 Imaging    MRCP abdomen:  Lack of intravenous contrast enhancement noted likely due to contrast extravasation, although none was apparent to the technologist or patient at the time of the exam).  2.0 cm mass in the uncinate process of the pancreas, highly suspicious for pancreatic carcinoma. Consider endoscopic ultrasound with FNA for tissue diagnosis.  No evidence of biliary ductal dilatation or abdominal metastatic disease.    02/19/2018 Procedure    EUS: A round mass was identified in uncinate process of the pancreas, measuring 2 x 2 cm.  FNA of the mass was performed.    02/19/2018 Pathology Results    Pathology (Accession: [id number]): Adenocarcinoma.     02/25/2018 Cancer Staging    Staging form: Exocrine Pancreas, AJCC 8th Edition - Clinical: Stage IB (cT2, cN0, cM0) - Signed by Tish Men, MD on 02/25/2018     MEDICAL HISTORY:  Past Medical History:  Diagnosis Date  . Head injury   . Thyroid disease     SURGICAL HISTORY: Past Surgical History:  Procedure Laterality Date  . ABDOMINAL HYSTERECTOMY    . APPENDECTOMY    . BRAIN SURGERY    . COLON SURGERY    . FINE NEEDLE ASPIRATION N/A 02/19/2018   Procedure: FINE NEEDLE ASPIRATION (FNA) LINEAR;  Surgeon: Carol Ada, MD;  Location: [facility] ENDOSCOPY;  Service: Endoscopy;  Laterality: N/A;  . UPPER ESOPHAGEAL ENDOSCOPIC ULTRASOUND (EUS) N/A 02/19/2018   Procedure: UPPER ESOPHAGEAL ENDOSCOPIC ULTRASOUND (EUS);  Surgeon: Carol Ada, MD;  Location: [facility];  Service: Endoscopy;  Laterality: N/A;    SOCIAL HISTORY: Social History   Socioeconomic History  . Marital status: Single    Spouse name: Not on file  . Number  of children: Not on file  . Years of education: Not on file  . Highest education level: Not on file  Occupational History  . Not on file  Social Needs  . Financial resource strain: Not on file  . Food insecurity:    Worry: Not on file    Inability: Not on file  . Transportation needs:    Medical: Not on file    Non-medical: Not on file  Tobacco Use  . Smoking status: Never Smoker  . Smokeless tobacco: Never Used  Substance and Sexual Activity  . Alcohol use: Not on file  . Drug use: Not on file  . Sexual activity: Not on file  Lifestyle  . Physical activity:    Days per week: Not on file    Minutes per session: Not on file  . Stress: Not on file  Relationships  . Social connections:    Talks on phone: Not on file    Gets together: Not on file    Attends religious service: Not on file    Active member of club or organization: Not on file    Attends meetings of clubs or organizations:  Not on file    Relationship status: Not on file  . Intimate partner violence:    Fear of current or ex partner: Not on file    Emotionally abused: Not on file    Physically abused: Not on file    Forced sexual activity: Not on file  Other Topics Concern  . Not on file  Social History Narrative  . Not on file    FAMILY HISTORY: History reviewed. No pertinent family history.  ALLERGIES:  is allergic to tape.  MEDICATIONS:  Current Outpatient Medications  Medication Sig Dispense Refill  . acetaminophen (TYLENOL) 500 MG tablet Take 500 mg by mouth every 6 (six) hours as needed for mild pain.    Francia Greaves THYROID 30 MG tablet Take 30 mg by mouth daily before breakfast.    . aspirin EC 81 MG EC tablet Take 1 tablet (81 mg total) by mouth daily. 30 tablet 0  . estradiol (ESTRACE) 0.5 MG tablet Take 0.5 mg by mouth daily.    . polyethylene glycol (MIRALAX / GLYCOLAX) packet Take 17 g by mouth daily.    Marland Kitchen HYDROcodone-acetaminophen (NORCO/VICODIN) 5-325 MG tablet Take 1 tablet by mouth every  12 (twelve) hours.  0   No current facility-administered medications for this visit.     REVIEW OF SYSTEMS:   Constitutional: ( - ) fevers, ( - )  chills , ( - ) night sweats Eyes: ( - ) blurriness of vision, ( - ) double vision, ( - ) watery eyes Ears, nose, mouth, throat, and face: ( - ) mucositis, ( - ) sore throat Respiratory: ( - ) cough, ( - ) dyspnea, ( - ) wheezes Cardiovascular: ( - ) palpitation, ( - ) chest discomfort, ( - ) lower extremity swelling Skin: ( - ) abnormal skin rashes Lymphatics: ( - ) new lymphadenopathy, ( - ) easy bruising Neurological: ( - ) numbness, ( - ) tingling, ( - ) new weaknesses Behavioral/Psych: ( - ) mood change, ( - ) new changes  All other systems were reviewed with the patient and are negative.  PHYSICAL EXAMINATION: ECOG PERFORMANCE STATUS: 0 - Asymptomatic  Vitals:   02/26/18 0913  BP: (!) 146/49  Pulse: 61  Resp: 20  Temp: (!) 97.5 F (36.4 C)  SpO2: 100%   Filed Weights   02/26/18 0913  Weight: 122 lb 12.8 oz (55.7 kg)    GENERAL: alert, no distress and comfortable SKIN: skin color, texture, turgor are normal, no rashes or significant lesions EYES: conjunctiva are pink and non-injected, sclera clear OROPHARYNX: no exudate, no erythema; lips, buccal mucosa, and tongue normal  NECK: supple, non-tender LYMPH:  no palpable lymphadenopathy in the cervical, axillary or inguinal LUNGS: clear to auscultation and percussion with normal breathing effort HEART: regular rate & rhythm and no murmurs and no lower extremity edema ABDOMEN: soft, non-tender, non-distended, normal bowel sounds Musculoskeletal: no cyanosis of digits and no clubbing  PSYCH: alert & oriented x 3, fluent speech NEURO: no focal motor/sensory deficits  LABORATORY DATA:  I have reviewed the data as listed Lab Results  Component Value Date   WBC 4.9 02/26/2018   HGB 13.7 02/26/2018   HCT 41.1 02/26/2018   MCV 94.7 02/26/2018   PLT 191 02/26/2018   Lab  Results  Component Value Date   NA 139 12/16/2016   K 3.6 12/16/2016   CL 103 12/16/2016   CO2 26 12/16/2016    RADIOGRAPHIC STUDIES: I have personally reviewed the radiological  images as listed and agreed with the findings in the report. Ct Abdomen Pelvis W Contrast  Result Date: 02/14/2018 CLINICAL DATA:  77 year old female with left lower quadrant pain for the past 2 weeks EXAM: CT ABDOMEN AND PELVIS WITH CONTRAST TECHNIQUE: Multidetector CT imaging of the abdomen and pelvis was performed using the standard protocol following bolus administration of intravenous contrast. CONTRAST:  58m ISOVUE-300 IOPAMIDOL (ISOVUE-300) INJECTION 61%, 1078mOMNIPAQUE IOHEXOL 300 MG/ML SOLN COMPARISON:  None. FINDINGS: Lower chest: Scattered bilateral pulmonary nodules the largest of which measures 6 mm in the periphery of the right lower lobe. Otherwise, the visualized lower lungs are clear. Visualized cardiac structures are normal in size. Unremarkable distal thoracic esophagus. Hepatobiliary: Geographic hypoattenuation in the left hemi-liver adjacent to the fissure for the falciform ligament is nonspecific but most suggestive of benign focal fatty infiltration. Small circumscribed low-attenuation lesion in hepatic segment 4A is too small for accurate characterization but statistically very likely to represent a benign cyst. Gallbladder is unremarkable. No intra or extrahepatic biliary ductal dilatation. Pancreas: Ill-defined hypoattenuating mass arising from the uncinate process pancreas measures 1.9 x 2.3 x 2.0 cm. No involvement of the main SMA or SMV. The mass does common the contact with multiple jejunal branches arising from the SMA. The imaging features are concerning for a pancreatic adenocarcinoma. No significant pancreatic or biliary ductal dilatation. Spleen: Normal in size without focal abnormality. Adrenals/Urinary Tract: Normal adrenal glands. No evidence of hydronephrosis, nephrolithiasis or enhancing  renal mass. The ureters and bladder are unremarkable. Stomach/Bowel: Surgical changes of prior sigmoid colectomy. The colocolonic anastomosis is widely patent. No evidence of obstruction. Unremarkable terminal ileum. Vascular/Lymphatic: Mild atherosclerotic vascular calcifications. No evidence of aneurysm or dissection. No suspicious lymphadenopathy. Reproductive: Surgical changes of prior hysterectomy. No adnexal mass. Other: No abdominal wall hernia or abnormality. No abdominopelvic ascites. Musculoskeletal: No acute fracture or aggressive appearing lytic or blastic osseous lesion. IMPRESSION: 1. CT findings are concerning for pancreatic adenocarcinoma arising from the uncinate process of the pancreas. No biliary obstruction at this time. 2. Geographic hypoattenuation in the left hemi liver on either side of the fissure for the falciform ligament has an imaging appearance most suggestive of benign focal fatty infiltration. However, given the suspected findings of pancreatic adenocarcinoma, metastatic disease is difficult to exclude. Consider further evaluation with gadolinium-enhanced MRI. 3. Similarly, there are multiple tiny scattered pulmonary nodules which likely represent old granulomatous disease but warrant close attention on follow-up imaging. 4. Surgical changes of prior sigmoid colectomy and colocolonic anastomosis without evidence of complication. No compelling evidence of acute diverticulitis or colitis to explain the patient's left lower quadrant symptoms. 5.  Aortic Atherosclerosis (ICD10-170.0). These results will be called to the ordering clinician or representative by the Radiologist Assistant, and communication documented in the PACS or zVision Dashboard. Electronically Signed   By: HeJacqulynn Cadet.D.   On: 02/14/2018 18:48   Mr 3d Recon At Scanner  Result Date: 02/16/2018 CLINICAL DATA:  Abdominal pain for several weeks. Possible pancreatic mass on recent CT. EXAM: MRI ABDOMEN WITHOUT AND  WITH CONTRAST (INCLUDING MRCP) TECHNIQUE: Multiplanar multisequence MR imaging of the abdomen was performed both before and after the administration of intravenous contrast. Heavily T2-weighted images of the biliary and pancreatic ducts were obtained, and three-dimensional MRCP images were rendered by post processing. CONTRAST:  6 mL Gadavist COMPARISON:  CT on 02/14/2018 FINDINGS: Lower chest: No acute findings. Hepatobiliary: A tiny sub-cm cyst is seen in the left hepatic lobe. Focal fatty infiltration is also  seen in the left lobe adjacent to the falciform ligament. No visible contrast enhancement of liver or other parenchymal organs is seen which may be due to contrast extravasation (although none was apparent to the technologist or patient). However, no hepatic masses identified. Gallbladder is unremarkable. No evidence of biliary ductal dilatation. Pancreas: No visible contrast enhancement of the pancreas, however an ill-defined slightly T2 hyperintense mass is seen in uncinate process of the pancreas measuring 2.0 cm on image 80/series 1,100. No evidence of pancreatic ductal dilatation or peripancreatic fluid collections. 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 Spleen:  Within normal limits in size and appearance. Adrenals/Urinary Tract: No masses identified. No evidence of hydronephrosis. Stomach/Bowel: Visualized portion unremarkable. Vascular/Lymphatic: No pathologically enlarged lymph nodes identified. No abdominal aortic aneurysm. Other:  None. Musculoskeletal:  No suspicious bone lesions identified. IMPRESSION: Lack of intravenous contrast enhancement noted likely due to contrast extravasation, although none was apparent to the technologist or patient at the time of the exam). 2.0 cm mass in the uncinate process of the pancreas, highly suspicious for pancreatic carcinoma. Consider endoscopic ultrasound with FNA for tissue diagnosis. No evidence of biliary ductal dilatation or abdominal metastatic disease.  Electronically Signed   By: Earle Gell M.D.   On: 02/16/2018 19:02   Mr Abdomen Mrcp Moise Boring Contast  Result Date: 02/16/2018 CLINICAL DATA:  Abdominal pain for several weeks. Possible pancreatic mass on recent CT. EXAM: MRI ABDOMEN WITHOUT AND WITH CONTRAST (INCLUDING MRCP) TECHNIQUE: Multiplanar multisequence MR imaging of the abdomen was performed both before and after the administration of intravenous contrast. Heavily T2-weighted images of the biliary and pancreatic ducts were obtained, and three-dimensional MRCP images were rendered by post processing. CONTRAST:  6 mL Gadavist COMPARISON:  CT on 02/14/2018 FINDINGS: Lower chest: No acute findings. Hepatobiliary: A tiny sub-cm cyst is seen in the left hepatic lobe. Focal fatty infiltration is also seen in the left lobe adjacent to the falciform ligament. No visible contrast enhancement of liver or other parenchymal organs is seen which may be due to contrast extravasation (although none was apparent to the technologist or patient). However, no hepatic masses identified. Gallbladder is unremarkable. No evidence of biliary ductal dilatation. Pancreas: No visible contrast enhancement of the pancreas, however an ill-defined slightly T2 hyperintense mass is seen in uncinate process of the pancreas measuring 2.0 cm on image 80/series 1,100. No evidence of pancreatic ductal dilatation or peripancreatic fluid collections. 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 Spleen:  Within normal limits in size and appearance. Adrenals/Urinary Tract: No masses identified. No evidence of hydronephrosis. Stomach/Bowel: Visualized portion unremarkable. Vascular/Lymphatic: No pathologically enlarged lymph nodes identified. No abdominal aortic aneurysm. Other:  None. Musculoskeletal:  No suspicious bone lesions identified. IMPRESSION: Lack of intravenous contrast enhancement noted likely due to contrast extravasation, although none was apparent to the technologist or patient at the time of  the exam). 2.0 cm mass in the uncinate process of the pancreas, highly suspicious for pancreatic carcinoma. Consider endoscopic ultrasound with FNA for tissue diagnosis. No evidence of biliary ductal dilatation or abdominal metastatic disease. Electronically Signed   By: Earle Gell M.D.   On: 02/16/2018 19:02    PATHOLOGY: I have reviewed the pathology reports as listed above.

## 2018-02-26 NOTE — Assessment & Plan Note (Addendum)
The imaging and pathology results were reviewed extensively with the pt and her family. Imaging evidence so far has not show any evidence of locally advanced or metastatic disease in the abdomen. Labs pending today, including CEA and CA19-9.  We will order CT chest with contrast to rule out any metastatic disease in the chest.  If imaging studies do not show any evidence of metastatic disease, then patient may be candidate for upfront surgery. Given her age and multiple comorbidities, she is at high risk for significant chemotherapy related toxicities.  I have referred patient to general surgery was for evaluation with Dr. Barry Dienes. I will see patient back in 1 week to review the imaging studies.

## 2018-02-26 NOTE — Assessment & Plan Note (Signed)
Patient has residual neurologic deficit at this time. Continue aspirin for stroke prophylaxis.

## 2018-02-27 LAB — CANCER ANTIGEN 19-9: CA 19-9: 730 U/mL — ABNORMAL HIGH (ref 0–35)

## 2018-02-28 ENCOUNTER — Ambulatory Visit (HOSPITAL_BASED_OUTPATIENT_CLINIC_OR_DEPARTMENT_OTHER)
Admission: RE | Admit: 2018-02-28 | Discharge: 2018-02-28 | Disposition: A | Discharge status: Home / Self Care | Payer: Medicare PPO | Source: Ambulatory Visit | Attending: Hematology | Admitting: Hematology

## 2018-02-28 ENCOUNTER — Encounter (HOSPITAL_BASED_OUTPATIENT_CLINIC_OR_DEPARTMENT_OTHER): Payer: Self-pay

## 2018-02-28 DIAGNOSIS — C259 Malignant neoplasm of pancreas, unspecified: Secondary | ICD-10-CM | POA: Insufficient documentation

## 2018-02-28 DIAGNOSIS — R918 Other nonspecific abnormal finding of lung field: Secondary | ICD-10-CM | POA: Insufficient documentation

## 2018-02-28 DIAGNOSIS — I700 Atherosclerosis of aorta: Secondary | ICD-10-CM | POA: Insufficient documentation

## 2018-02-28 HISTORY — DX: Malignant (primary) neoplasm, unspecified: C80.1

## 2018-02-28 MED ORDER — IOPAMIDOL (ISOVUE-300) INJECTION 61%
100.0000 mL | Freq: Once | INTRAVENOUS | Status: AC | PRN
  Administered 2018-02-28: 80 mL via INTRAVENOUS

## 2018-03-01 ENCOUNTER — Telehealth: Payer: Self-pay | Admitting: *Deleted

## 2018-03-01 NOTE — Telephone Encounter (Signed)
-----   Message from Tish Men, MD sent at 03/01/2018  8:54 AM EDT ----- Would you mind calling the pt and let her know that the CT chest did not show any definitive evidence of mets? She should proceed for surgical evaluation as I had discussed with her. Thank you.

## 2018-03-01 NOTE — Telephone Encounter (Signed)
Patient notified per order of Dr. Maylon Peppers that the CT chest did not show any definitive evidence of mets and that she should proceed for surgical evaluation as discussed at appt on 02/26/18. Pt would like to know if she still needs to come in for scheduled appt on 03/05/18.  Informed pt that I would discuss next appt with Dr. Maylon Peppers and call her back with MD orders.  Patient appreciative of call.

## 2018-03-04 ENCOUNTER — Telehealth: Payer: Self-pay | Admitting: Hematology

## 2018-03-04 ENCOUNTER — Telehealth: Payer: Self-pay | Admitting: *Deleted

## 2018-03-04 NOTE — Telephone Encounter (Signed)
I received a request from Ms. Janice [name] to discuss with her regarding the upcoming surgery.  Patient was concerned about the extensiveness of the Whipple surgery and questioned why a smaller surgery could not be done instead.  I explained to the patient that I did not have the knowledge regarding the best surgical option to achieve a possible cure, and that I would defer the recommendation to Dr. Barry Dienes.  The patient also asked about what would happen if she did not proceed with surgery.  I explained to the patient that if left untreated, her pancreatic cancer would progress and likely metastasize to other organs, including lungs, liver, and bones.  If that were to happen, the cancer would be no longer curable, and her life expectancy will be very limited.  Furthermore, while there are potential local therapy options (when surgical resection is not feasible), such as chemotherapy and/or radiation, surgery is far more superior in terms of the potential to achieve a cure in early stage pancreatic cancer.  In light of her early stage pancreatic cancer and weighing the risks and benefits, my recommendation would be to proceed with surgery as recommended by Dr. Barry Dienes.  I offered to speak with the patient's daughter, but the patient did not think it was necessary at this time.  The patient expressed understanding of our conversation, and did not have any further questions at this time.  I encouraged the patient to further discuss her decision with her family, and contact us as soon as possible if she ultimately decided against surgery.  I spent a total of 30 minutes over the phone with the patient.

## 2018-03-04 NOTE — Telephone Encounter (Signed)
Message left for pt to call office back for instructions from Dr. Maylon Peppers.

## 2018-03-05 ENCOUNTER — Inpatient Hospital Stay: Payer: Medicare PPO | Admitting: Hematology

## 2018-03-05 ENCOUNTER — Other Ambulatory Visit: Payer: Self-pay | Admitting: General Surgery

## 2018-03-13 ENCOUNTER — Encounter: Payer: Self-pay | Admitting: Genetic Counselor

## 2018-03-13 ENCOUNTER — Telehealth: Payer: Self-pay | Admitting: Genetic Counselor

## 2018-03-13 NOTE — Telephone Encounter (Signed)
A genetic counseling referral has been received from Dr. Barry Dienes. Pt has been scheduled to see Roma Kayser on 11/11 at 1pm. Letter mailed.

## 2018-03-13 NOTE — Pre-Procedure Instructions (Signed)
Janice [name]  03/13/2018      [id number] - [place], [place] - [address] [place] [address] Phone: [phone] Fax: [phone]    Your procedure is scheduled on Wednesday, Oct. 9th   Report to [facility] Admitting at  8:30 AM             (posted surgery time 10:30a - 4:30p)   Call this number if you have problems the morning of surgery:  [phone]   Remember:  Do not eat any foods or drink any liquids after midnight, Tuesday.   5 days prior to surgery, STOP TAKING any Vitamins, Herbal Supplements, Anti-inflammatories, Blood Thinners.    Take these medicines the morning of surgery with A SIP OF WATER : Armour,     Do not wear jewelry, make-up or nail polish.  Do not wear lotions, powders, perfumes, or deodorant.  Do not shave 48 hours prior to surgery.   Do not bring valuables to the hospital.  [facility] is not responsible for any belongings or valuables.  Contacts, dentures or bridgework may not be worn into surgery.  Leave your suitcase in the car.  After surgery it may be brought to your room.  For patients admitted to the hospital, discharge time will be determined by your treatment team.  Please read over the following fact sheets that you were given. Surgical Site Infection Prevention        [place]- Preparing For Surgery  Before surgery, you can play an important role. Because skin is not sterile, your skin needs to be as free of germs as possible. You can reduce the number of germs on your skin by washing with CHG (chlorahexidine gluconate) Soap before surgery.  CHG is an antiseptic cleaner which kills germs and bonds with the skin to continue killing germs even after washing.    Oral Hygiene is also important to reduce your risk of infection.    Remember - BRUSH YOUR TEETH THE MORNING OF SURGERY WITH YOUR REGULAR TOOTHPASTE  Please do not use if you have an allergy to CHG or  antibacterial soaps. If your skin becomes reddened/irritated stop using the CHG.  Do not shave (including legs and underarms) for at least 48 hours prior to first CHG shower. It is OK to shave your face.  Please follow these instructions carefully.   1. Shower the NIGHT BEFORE SURGERY and the MORNING OF SURGERY with CHG.   2. If you chose to wash your hair, wash your hair first as usual with your normal shampoo.  3. After you shampoo, rinse your hair and body thoroughly to remove the shampoo.  4. Use CHG as you would any other liquid soap. You can apply CHG directly to the skin and wash gently with a scrungie or a clean washcloth.   5. Apply the CHG Soap to your body ONLY FROM THE NECK DOWN.  Do not use on open wounds or open sores. Avoid contact with your eyes, ears, mouth and genitals (private parts). Wash Face and genitals (private parts)  with your normal soap.  6. Wash thoroughly, paying special attention to the area where your surgery will be performed.  7. Thoroughly rinse your body with warm water from the neck down.  8. DO NOT shower/wash with your normal soap after using and rinsing off the CHG Soap.  9. Pat yourself dry with a CLEAN TOWEL.  10.  Wear CLEAN PAJAMAS to bed the night before surgery, wear comfortable clothes the morning of surgery  11. Place CLEAN SHEETS on your bed the night of your first shower and DO NOT SLEEP WITH PETS.  Day of Surgery:  Do not apply any deodorants/lotions.  Please wear clean clothes to the hospital/surgery center.    Remember to brush your teeth WITH YOUR REGULAR TOOTHPASTE.

## 2018-03-14 ENCOUNTER — Encounter (HOSPITAL_COMMUNITY): Payer: Self-pay

## 2018-03-14 ENCOUNTER — Other Ambulatory Visit: Payer: Self-pay

## 2018-03-14 ENCOUNTER — Encounter (HOSPITAL_COMMUNITY)
Admission: RE | Admit: 2018-03-14 | Discharge: 2018-03-14 | Disposition: A | Discharge status: Home / Self Care | Payer: Medicare PPO | Source: Ambulatory Visit | Attending: General Surgery | Admitting: General Surgery

## 2018-03-14 DIAGNOSIS — Z01818 Encounter for other preprocedural examination: Secondary | ICD-10-CM | POA: Insufficient documentation

## 2018-03-14 HISTORY — DX: History of falling: Z91.81

## 2018-03-14 HISTORY — DX: Headache, unspecified: R51.9

## 2018-03-14 HISTORY — DX: Headache: R51

## 2018-03-14 HISTORY — DX: Trigger finger, unspecified finger: M65.30

## 2018-03-14 HISTORY — DX: Gastro-esophageal reflux disease without esophagitis: K21.9

## 2018-03-14 HISTORY — DX: Hypothyroidism, unspecified: E03.9

## 2018-03-14 HISTORY — DX: Cerebral infarction, unspecified: I63.9

## 2018-03-14 LAB — COMPREHENSIVE METABOLIC PANEL
ALK PHOS: 45 U/L (ref 38–126)
ALT: 20 U/L (ref 0–44)
ANION GAP: 9 (ref 5–15)
AST: 25 U/L (ref 15–41)
Albumin: 3.9 g/dL (ref 3.5–5.0)
BUN: 9 mg/dL (ref 8–23)
CALCIUM: 9.7 mg/dL (ref 8.9–10.3)
CHLORIDE: 104 mmol/L (ref 98–111)
CO2: 25 mmol/L (ref 22–32)
CREATININE: 0.59 mg/dL (ref 0.44–1.00)
Glucose, Bld: 120 mg/dL — ABNORMAL HIGH (ref 70–99)
Potassium: 3.8 mmol/L (ref 3.5–5.1)
Sodium: 138 mmol/L (ref 135–145)
Total Bilirubin: 0.4 mg/dL (ref 0.3–1.2)
Total Protein: 6.7 g/dL (ref 6.5–8.1)

## 2018-03-14 LAB — CBC WITH DIFFERENTIAL/PLATELET
ABS IMMATURE GRANULOCYTES: 0 10*3/uL (ref 0.0–0.1)
BASOS ABS: 0 10*3/uL (ref 0.0–0.1)
BASOS PCT: 1 %
EOS ABS: 0.1 10*3/uL (ref 0.0–0.7)
Eosinophils Relative: 2 %
HCT: 41.4 % (ref 36.0–46.0)
Hemoglobin: 13.4 g/dL (ref 12.0–15.0)
IMMATURE GRANULOCYTES: 0 %
Lymphocytes Relative: 35 %
Lymphs Abs: 2.1 10*3/uL (ref 0.7–4.0)
MCH: 31.3 pg (ref 26.0–34.0)
MCHC: 32.4 g/dL (ref 30.0–36.0)
MCV: 96.7 fL (ref 78.0–100.0)
Monocytes Absolute: 0.5 10*3/uL (ref 0.1–1.0)
Monocytes Relative: 8 %
NEUTROS ABS: 3.2 10*3/uL (ref 1.7–7.7)
NEUTROS PCT: 54 %
PLATELETS: 195 10*3/uL (ref 150–400)
RBC: 4.28 MIL/uL (ref 3.87–5.11)
RDW: 12.7 % (ref 11.5–15.5)
WBC: 5.9 10*3/uL (ref 4.0–10.5)

## 2018-03-14 LAB — PREPARE RBC (CROSSMATCH)

## 2018-03-14 LAB — ABO/RH: ABO/RH(D): A POS

## 2018-03-14 LAB — PROTIME-INR
INR: 0.9
Prothrombin Time: 12 seconds (ref 11.4–15.2)

## 2018-03-14 NOTE — Progress Notes (Signed)
PCP is Dr. Neta Mends, at [facility] 10/2017 Oncologist is Dr. Mickey Farber 02/2018 There seems to be family tension going on.  Her daughter was present at PAT, and also her [place]. (he will bring a copy of papers)

## 2018-03-18 ENCOUNTER — Telehealth: Payer: Self-pay | Admitting: Genetic Counselor

## 2018-03-18 ENCOUNTER — Encounter: Payer: Self-pay | Admitting: Genetic Counselor

## 2018-03-18 NOTE — Telephone Encounter (Signed)
Patient was referred for genetic testing, but is very concerned about the cost.  I will do a preauthorization and let her know.  I discussed that from 2018 data, individuals with her insurance did not have a cost for testing, and that we will have the laboratory preauthorize her testing.

## 2018-03-19 NOTE — Anesthesia Preprocedure Evaluation (Signed)
Anesthesia Evaluation  Patient identified by MRN, date of birth, ID band Patient awake    Reviewed: Allergy & Precautions, NPO status , Patient's Chart, lab work & pertinent test results  Airway Mallampati: II  TM Distance: >3 FB Neck ROM: Full    Dental   Pulmonary neg pulmonary ROS,    breath sounds clear to auscultation       Cardiovascular negative cardio ROS   Rhythm:Regular Rate:Normal  Echo 7/18 - Left ventricle: The cavity size was normal. Systolic function was   normal. The estimated ejection fraction was in the range of 55%   to 60%. Wall motion was normal; there were no regional wall   motion abnormalities. Doppler parameters are consistent with   abnormal left ventricular relaxation (grade 1 diastolic   dysfunction). - Aortic valve: Mildly calcified annulus. Trileaflet; normal   thickness leaflets. There was mild regurgitation.   Neuro/Psych  Headaches, PSYCHIATRIC DISORDERS CVA    GI/Hepatic Neg liver ROS, GERD  Medicated,  Endo/Other  Hypothyroidism   Renal/GU negative Renal ROS  negative genitourinary   Musculoskeletal negative musculoskeletal ROS (+)   Abdominal   Peds  Hematology negative hematology ROS (+)   Anesthesia Other Findings   Reproductive/Obstetrics negative OB ROS                             Anesthesia Physical  Anesthesia Plan  ASA: III  Anesthesia Plan: General   Post-op Pain Management: GA combined w/ Regional for post-op pain   Induction: Intravenous  PONV Risk Score and Plan: 2 and 3 and Propofol infusion, Ondansetron and Treatment may vary due to age or medical condition  Airway Management Planned: Oral ETT  Additional Equipment:   Intra-op Plan:   Post-operative Plan: Extubation in OR  Informed Consent: I have reviewed the patients History and Physical, chart, labs and discussed the procedure including the risks, benefits and  alternatives for the proposed anesthesia with the patient or authorized representative who has indicated his/her understanding and acceptance.     Plan Discussed with: CRNA, Anesthesiologist and Surgeon  Anesthesia Plan Comments: (  )        Anesthesia Quick Evaluation

## 2018-03-19 NOTE — H&P (Signed)
Janice [name] Documented: 02/28/2018 4:27 PM Location: [place] Office Patient #: [id number] DOB: [birth year] Divorced / Language: Cleophus Molt / Race: White Female   History of Present Illness Janice Klein MD; 03/11/2018 5:35 PM) The patient is a 77 year old female who presents with pancreatic cancer. Pt is a 77 yo F referred by Dr. Benson Norway for a new diagnosis of pancreatic cancer 02/2018. She presented with intermittent lower abdominal pain in August 2019 and thought it was constipation. She saw GI and due to concerns of possible diverticulitis flare, a CT was perfomed. An incidental pancreatic mass was noted. This was further worked up wtih MRI, EUS/biopsy, and chest CT. Biopsies were positive for adenocarcinoma. Staging was negative for metastatic disease. She denies jaundice or diabetes. She has no diarrhea. She feels reasonably energetic and does what she wants to do with minimal limitation.   She has no personal history of cancer, but her daughter has had colorectal cancer.   Ct abd/pelvis 02/14/18 IMPRESSION: 1. CT findings are concerning for pancreatic adenocarcinoma arising from the uncinate process of the pancreas. No biliary obstruction at this time. 2. Geographic hypoattenuation in the left hemi liver on either side of the fissure for the falciform ligament has an imaging appearance most suggestive of benign focal fatty infiltration. However, given the suspected findings of pancreatic adenocarcinoma, metastatic disease is difficult to exclude. Consider further evaluation with gadolinium-enhanced MRI. 3. Similarly, there are multiple tiny scattered pulmonary nodules which likely represent old granulomatous disease but warrant close attention on follow-up imaging. 4. Surgical changes of prior sigmoid colectomy and colocolonic anastomosis without evidence of complication. No compelling evidence of acute diverticulitis or colitis to explain the patient's left lower quadrant  symptoms. 5. Aortic Atherosclerosis (ICD10-170.0).  MRI abd (contrast extravasated) 02/16/18 IMPRESSION: Lack of intravenous contrast enhancement noted likely due to contrast extravasation, although none was apparent to the technologist or patient at the time of the exam).  2.0 cm mass in the uncinate process of the pancreas, highly suspicious for pancreatic carcinoma. Consider endoscopic ultrasound with FNA for tissue diagnosis.  No evidence of biliary ductal dilatation or abdominal metastatic disease.  Ct chest 02/28/18  IMPRESSION: 1. Basilar and subpleural predominant pulmonary nodules are indeterminate. Benign etiology (most likely subpleural lymph nodes) favored. Recommend attention on follow-up. 2. No thoracic adenopathy. 3. Aortic Atherosclerosis (ICD10-I70.0).  EUS 02/19/18 Findings: A round mass was identified in the uncinate process of the pancreas. The mass was hypoechoic. The mass measured 20 mm by 20 mm in maximal cross-sectional diameter. The endosonographic borders were well-defined. Fine needle aspiration for cytology was performed. Color Doppler imaging was utilized prior to needle puncture to confirm a lack of significant vascular structures within the needle path. Four passes were made with the 25 gauge needle using a transduodenal approach. A stylet was used. A cytotechnologist was present to evaluate the adequacy of the specimen. The cellularity of the specimen was adequate. Final cytology results are pending. The mass was easily identified in the uncinate process of the pancreas. It was a rounded 2 x 2 cm hypoechoic mass with a well-circumscribed border. There was no evidence of any vascular invasion. A narrow window for FNA was obtained as there was a larger overlying vessel and ectatic/dilated side branch ducts in the region. A combination of torqing the scope and maneuvering the elevated allowed for the needle to thread through this window. Adequate  sampling was obtained. Close observation with doppler after the FNA did now show any aberrant bleeding and there  was no bleeding in the lumen of the duodenum. The upper intra-abdominal structures appeared to be normal. - A mass was identified in the uncinate process of the pancreas. Fine needle aspiration performed.  cytology 02/19/18 Diagnosis FINE NEEDLE ASPIRATION, ENDOSCOPIC, PANCREAS UNCINATE (SPECIMEN 1 OF 1 COLLECTED 02/19/18): MALIGNANT CELLS PRESENT, CONSISTENT WITH ADENOCARCINOMA.  LFTs normal. CA 19-9 730 CBC normal.   Past Surgical History (April Staton, CMA; 02/28/2018 4:27 PM) Appendectomy  Hysterectomy (not due to cancer) - Complete  Oral Surgery  Tonsillectomy   Diagnostic Studies History (April Staton, CMA; 02/28/2018 4:27 PM) Colonoscopy  1-5 years ago Mammogram  1-3 years ago Pap Smear  1-5 years ago  Allergies (April Staton, [place]; 02/28/2018 4:28 PM) Tape 1"X5yd *MEDICAL DEVICES AND SUPPLIES*   Medication History (April Staton, CMA; 02/28/2018 4:28 PM) Armour Thyroid (30MG  Tablet, Oral) Active. Estradiol (0.5MG  Tablet, Oral) Active. Aspirin (81MG  Tablet, Oral) Active. Medications Reconciled  Social History (April Staton, CMA; 02/28/2018 4:27 PM) Alcohol use  Moderate alcohol use. Caffeine use  Coffee. No drug use  Tobacco use  Never smoker.  Family History (April Staton, [place]; 02/28/2018 4:27 PM) Alcohol Abuse  Mother. Arthritis  Daughter, Father. Colon Cancer  Daughter. Colon Polyps  Daughter. Migraine Headache  Daughter. Rectal Cancer  Daughter.  Pregnancy / Birth History (April Staton, [place]; 02/28/2018 4:27 PM) Age at menarche  89 years. Age of menopause  >60 Contraceptive History  Intrauterine device, Oral contraceptives. Gravida  2 Maternal age  42-20 Para  2 Regular periods   Other Problems (April Staton, [place]; 02/28/2018 4:27 PM) Arthritis  Bladder Problems  Cerebrovascular Accident  Diverticulosis  General  anesthesia - complications  Migraine Headache  Oophorectomy  Bilateral. Pancreatic Cancer  Thyroid Cancer  Thyroid Disease     Review of Systems (April Staton CMA; 02/28/2018 4:27 PM) General Present- Appetite Loss and Weight Loss. Not Present- Chills, Fatigue, Fever, Night Sweats and Weight Gain. HEENT Present- Hearing Loss, Hoarseness and Wears glasses/contact lenses. Not Present- Earache, Nose Bleed, Oral Ulcers, Ringing in the Ears, Seasonal Allergies, Sinus Pain, Sore Throat, Visual Disturbances and Yellow Eyes. Cardiovascular Present- Chest Pain. Not Present- Difficulty Breathing Lying Down, Leg Cramps, Palpitations, Rapid Heart Rate, Shortness of Breath and Swelling of Extremities. Gastrointestinal Present- Abdominal Pain, Bloating, Change in Bowel Habits, Excessive gas, Gets full quickly at meals and Indigestion. Not Present- Bloody Stool, Chronic diarrhea, Constipation, Difficulty Swallowing, Hemorrhoids, Nausea, Rectal Pain and Vomiting. Female Genitourinary Present- Frequency, Nocturia and Urgency. Not Present- Painful Urination and Pelvic Pain. Musculoskeletal Present- Back Pain. Not Present- Joint Pain, Joint Stiffness, Muscle Pain, Muscle Weakness and Swelling of Extremities. Neurological Present- Trouble walking. Not Present- Decreased Memory, Fainting, Headaches, Numbness, Seizures, Tingling, Tremor and Weakness. Endocrine Present- Hair Changes. Not Present- Cold Intolerance, Excessive Hunger, Heat Intolerance, Hot flashes and New Diabetes. Hematology Present- Blood Thinners. Not Present- Easy Bruising, Excessive bleeding, Gland problems, HIV and Persistent Infections.  Vitals (April Staton CMA; 02/28/2018 4:29 PM) 02/28/2018 4:28 PM Weight: 122.25 lb Height: 62in Body Surface Area: 1.55 m Body Mass Index: 22.36 kg/m  Temp.: 68F(Oral)  Pulse: 67 (Regular)  BP: 120/60 (Sitting, Left Arm, Standard)       Physical Exam Janice Klein MD; 03/11/2018 5:35  PM) General Mental Status-Alert. General Appearance-Consistent with stated age. Hydration-Well hydrated. Voice-Normal.  Head and Neck Head-normocephalic, atraumatic with no lesions or palpable masses. Trachea-midline. Thyroid Gland Characteristics - normal size and consistency.  Eye Eyeball - Bilateral-Extraocular movements intact. Sclera/Conjunctiva - Bilateral-No scleral icterus.  Chest and Lung  Exam Chest and lung exam reveals -quiet, even and easy respiratory effort with no use of accessory muscles and on auscultation, normal breath sounds, no adventitious sounds and normal vocal resonance. Inspection Chest Wall - Normal. Back - normal.  Cardiovascular Cardiovascular examination reveals -normal heart sounds, regular rate and rhythm with no murmurs and normal pedal pulses bilaterally.  Abdomen Inspection Inspection of the abdomen reveals - No Hernias. Palpation/Percussion Palpation and Percussion of the abdomen reveal - Soft, Non Tender, No Rebound tenderness, No Rigidity (guarding) and No hepatosplenomegaly. Auscultation Auscultation of the abdomen reveals - Bowel sounds normal.  Neurologic Neurologic evaluation reveals -alert and oriented x 3 with no impairment of recent or remote memory. Mental Status-Normal.  Musculoskeletal Global Assessment -Note: no gross deformities.  Normal Exam - Left-Upper Extremity Strength Normal and Lower Extremity Strength Normal. Normal Exam - Right-Upper Extremity Strength Normal and Lower Extremity Strength Normal.  Lymphatic Head & Neck  General Head & Neck Lymphatics: Bilateral - Description - Normal. Axillary  General Axillary Region: Bilateral - Description - Normal. Tenderness - Non Tender. Femoral & Inguinal  Generalized Femoral & Inguinal Lymphatics: Bilateral - Description - No Generalized lymphadenopathy.    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 She is minimally symptomatic and has not had any significant weight loss. Given her age and risk for chemotherapy toxicities, it is reasonable to do up front surgery for her. If her final tumor is below 2 cm, she would not be recommended to do chemo at all. She is quite robust overall. She will likely not need cardiac clearance if her EKG is abnormal.  I discussed the surgery with the patient including diagrams of anatomy. I discussed the potential for diagnostic laparoscopy. In the case of pancreatic cancer, if spread of the disease is found, we will abort the procedure and not proceed with resection. The rationale for this was discussed with the patient. There has not been data to support resection of Stage IV disease in terms of survival benefit.  We discussed possible complications including: Potential of aborting procedure if tumor is invading the superior mesenteric or hepatic arteries Bleeding Infection and possible wound complications such as hernia Damage to adjacent structures Leak of anastamoses, primarily pancreatic Possible need for other procedures Possible prolonged nausea with possible need for external feeding. Possible prolonged hospital stay. Possible development of diabetes or worsening of current diabetes. Possible pancreatic exocrine insufficiency Prolonged fatigue/weakness/appetite Possible early recurrence of cancer   The patient understands and wishes to proceed. The patient has been advised to turn in disability paperwork to our office. Current Plans You are being scheduled for surgery- Our schedulers will call you.  You should hear from our office's scheduling department within 5 working days about the location, date, and time of surgery. We try to make accommodations for patient's preferences in scheduling surgery, but sometimes the OR  schedule or the surgeon's schedule prevents [place] from making those accommodations.  If you have not heard from our office [phone]) in 5 working days, call the office and ask for your surgeon's nurse.  If you have other questions about your diagnosis, plan, or surgery, call the office and ask for your surgeon's nurse.  Pt Education - [facility] pt info Referred to Genetic Counseling, for evaluation and follow up [employer]). Routine.   Signed by Janice Klein, MD (03/11/2018 5:39 PM)

## 2018-03-20 ENCOUNTER — Inpatient Hospital Stay (HOSPITAL_COMMUNITY)
Admission: RE | Admit: 2018-03-20 | Discharge: 2018-03-28 | DRG: 421 | Disposition: A | Discharge status: Hospice | Payer: Medicare PPO | Source: Ambulatory Visit | Attending: General Surgery | Admitting: General Surgery

## 2018-03-20 ENCOUNTER — Inpatient Hospital Stay (HOSPITAL_COMMUNITY): Payer: Medicare PPO | Admitting: Anesthesiology

## 2018-03-20 ENCOUNTER — Other Ambulatory Visit: Payer: Self-pay

## 2018-03-20 ENCOUNTER — Encounter (HOSPITAL_COMMUNITY)
Admission: RE | Disposition: A | Discharge status: Hospice | Payer: Self-pay | Source: Ambulatory Visit | Attending: General Surgery

## 2018-03-20 ENCOUNTER — Encounter (HOSPITAL_COMMUNITY): Payer: Self-pay

## 2018-03-20 ENCOUNTER — Inpatient Hospital Stay (HOSPITAL_COMMUNITY): Payer: Medicare PPO | Admitting: Physician Assistant

## 2018-03-20 DIAGNOSIS — K219 Gastro-esophageal reflux disease without esophagitis: Secondary | ICD-10-CM | POA: Diagnosis present

## 2018-03-20 DIAGNOSIS — F411 Generalized anxiety disorder: Secondary | ICD-10-CM | POA: Diagnosis present

## 2018-03-20 DIAGNOSIS — Z800 Family history of malignant neoplasm of digestive organs: Secondary | ICD-10-CM | POA: Diagnosis not present

## 2018-03-20 DIAGNOSIS — Z66 Do not resuscitate: Secondary | ICD-10-CM | POA: Diagnosis present

## 2018-03-20 DIAGNOSIS — M25519 Pain in unspecified shoulder: Secondary | ICD-10-CM | POA: Diagnosis present

## 2018-03-20 DIAGNOSIS — Z6822 Body mass index (BMI) 22.0-22.9, adult: Secondary | ICD-10-CM

## 2018-03-20 DIAGNOSIS — C786 Secondary malignant neoplasm of retroperitoneum and peritoneum: Secondary | ICD-10-CM | POA: Diagnosis present

## 2018-03-20 DIAGNOSIS — Z808 Family history of malignant neoplasm of other organs or systems: Secondary | ICD-10-CM | POA: Diagnosis not present

## 2018-03-20 DIAGNOSIS — Z8673 Personal history of transient ischemic attack (TIA), and cerebral infarction without residual deficits: Secondary | ICD-10-CM | POA: Diagnosis not present

## 2018-03-20 DIAGNOSIS — G893 Neoplasm related pain (acute) (chronic): Secondary | ICD-10-CM | POA: Diagnosis present

## 2018-03-20 DIAGNOSIS — C787 Secondary malignant neoplasm of liver and intrahepatic bile duct: Principal | ICD-10-CM | POA: Diagnosis present

## 2018-03-20 DIAGNOSIS — Z7189 Other specified counseling: Secondary | ICD-10-CM | POA: Diagnosis not present

## 2018-03-20 DIAGNOSIS — C259 Malignant neoplasm of pancreas, unspecified: Secondary | ICD-10-CM | POA: Diagnosis present

## 2018-03-20 DIAGNOSIS — R112 Nausea with vomiting, unspecified: Secondary | ICD-10-CM | POA: Diagnosis not present

## 2018-03-20 DIAGNOSIS — R070 Pain in throat: Secondary | ICD-10-CM | POA: Diagnosis present

## 2018-03-20 DIAGNOSIS — E039 Hypothyroidism, unspecified: Secondary | ICD-10-CM | POA: Diagnosis present

## 2018-03-20 DIAGNOSIS — R109 Unspecified abdominal pain: Secondary | ICD-10-CM | POA: Diagnosis not present

## 2018-03-20 DIAGNOSIS — Z9049 Acquired absence of other specified parts of digestive tract: Secondary | ICD-10-CM

## 2018-03-20 DIAGNOSIS — E440 Moderate protein-calorie malnutrition: Secondary | ICD-10-CM | POA: Diagnosis present

## 2018-03-20 DIAGNOSIS — Z90710 Acquired absence of both cervix and uterus: Secondary | ICD-10-CM

## 2018-03-20 DIAGNOSIS — Z515 Encounter for palliative care: Secondary | ICD-10-CM | POA: Diagnosis present

## 2018-03-20 DIAGNOSIS — E46 Unspecified protein-calorie malnutrition: Secondary | ICD-10-CM | POA: Diagnosis not present

## 2018-03-20 DIAGNOSIS — K9081 Whipple's disease: Secondary | ICD-10-CM | POA: Diagnosis present

## 2018-03-20 HISTORY — PX: LAPAROSCOPY: SHX197

## 2018-03-20 HISTORY — PX: DIAGNOSTIC LAPAROSCOPY: SUR761

## 2018-03-20 HISTORY — PX: LYSIS OF ADHESION: SHX5961

## 2018-03-20 HISTORY — PX: GASTROJEJUNOSTOMY: SHX1697

## 2018-03-20 LAB — CBC
HEMATOCRIT: 35 % — AB (ref 36.0–46.0)
HEMOGLOBIN: 11.5 g/dL — AB (ref 12.0–15.0)
MCH: 31.2 pg (ref 26.0–34.0)
MCHC: 32.9 g/dL (ref 30.0–36.0)
MCV: 94.9 fL (ref 80.0–100.0)
Platelets: 178 10*3/uL (ref 150–400)
RBC: 3.69 MIL/uL — ABNORMAL LOW (ref 3.87–5.11)
RDW: 12.8 % (ref 11.5–15.5)
WBC: 6.7 10*3/uL (ref 4.0–10.5)
nRBC: 0 % (ref 0.0–0.2)

## 2018-03-20 LAB — POCT I-STAT 7, (LYTES, BLD GAS, ICA,H+H)
ACID-BASE EXCESS: 1 mmol/L (ref 0.0–2.0)
BICARBONATE: 26.6 mmol/L (ref 20.0–28.0)
CALCIUM ION: 1.26 mmol/L (ref 1.15–1.40)
HCT: 30 % — ABNORMAL LOW (ref 36.0–46.0)
HEMOGLOBIN: 10.2 g/dL — AB (ref 12.0–15.0)
O2 Saturation: 100 %
PH ART: 7.387 (ref 7.350–7.450)
PO2 ART: 297 mmHg — AB (ref 83.0–108.0)
Patient temperature: 35
Potassium: 3.5 mmol/L (ref 3.5–5.1)
SODIUM: 139 mmol/L (ref 135–145)
TCO2: 28 mmol/L (ref 22–32)
pCO2 arterial: 43.3 mmHg (ref 32.0–48.0)

## 2018-03-20 LAB — CREATININE, SERUM: Creatinine, Ser: 0.59 mg/dL (ref 0.44–1.00)

## 2018-03-20 SURGERY — LAPAROSCOPY, DIAGNOSTIC
Anesthesia: General | Site: Abdomen

## 2018-03-20 MED ORDER — FENTANYL CITRATE (PF) 250 MCG/5ML IJ SOLN
INTRAMUSCULAR | Status: DC | PRN
  Administered 2018-03-20 (×5): 50 ug via INTRAVENOUS

## 2018-03-20 MED ORDER — DIPHENHYDRAMINE HCL 50 MG/ML IJ SOLN
12.5000 mg | Freq: Four times a day (QID) | INTRAMUSCULAR | Status: DC | PRN
  Administered 2018-03-24: 12.5 mg via INTRAVENOUS
  Filled 2018-03-20: qty 1

## 2018-03-20 MED ORDER — ONDANSETRON HCL 4 MG/2ML IJ SOLN
4.0000 mg | Freq: Four times a day (QID) | INTRAMUSCULAR | Status: DC | PRN
  Administered 2018-03-22 – 2018-03-28 (×9): 4 mg via INTRAVENOUS
  Filled 2018-03-20 (×9): qty 2

## 2018-03-20 MED ORDER — ACETAMINOPHEN 500 MG PO TABS: 1000.0000 mg | ORAL_TABLET | ORAL | Status: DC

## 2018-03-20 MED ORDER — PROPOFOL 500 MG/50ML IV EMUL
INTRAVENOUS | Status: DC | PRN
  Administered 2018-03-20: 10 ug/kg/min via INTRAVENOUS

## 2018-03-20 MED ORDER — MENTHOL 3 MG MT LOZG
1.0000 | LOZENGE | OROMUCOSAL | Status: DC | PRN
  Filled 2018-03-20: qty 9

## 2018-03-20 MED ORDER — BUPIVACAINE-EPINEPHRINE (PF) 0.25% -1:200000 IJ SOLN
INTRAMUSCULAR | Status: AC
  Filled 2018-03-20: qty 30

## 2018-03-20 MED ORDER — ENOXAPARIN SODIUM 40 MG/0.4ML ~~LOC~~ SOLN
40.0000 mg | SUBCUTANEOUS | Status: DC
  Administered 2018-03-21: 40 mg via SUBCUTANEOUS
  Filled 2018-03-20: qty 0.4

## 2018-03-20 MED ORDER — LIDOCAINE HCL 1 % IJ SOLN
INTRAMUSCULAR | Status: DC | PRN
  Administered 2018-03-20: 40 mL

## 2018-03-20 MED ORDER — LIDOCAINE 2% (20 MG/ML) 5 ML SYRINGE
INTRAMUSCULAR | Status: AC
  Filled 2018-03-20: qty 5

## 2018-03-20 MED ORDER — LIDOCAINE 2% (20 MG/ML) 5 ML SYRINGE
INTRAMUSCULAR | Status: DC | PRN
  Administered 2018-03-20: 50 mg via INTRAVENOUS

## 2018-03-20 MED ORDER — ROPIVACAINE HCL 2 MG/ML IJ SOLN
5.0000 mL/h | INTRAMUSCULAR | Status: AC
  Administered 2018-03-20 – 2018-03-22 (×2): 5 mL/h via EPIDURAL
  Filled 2018-03-20 (×2): qty 200

## 2018-03-20 MED ORDER — LIDOCAINE HCL (PF) 2 % IJ SOLN
INTRAMUSCULAR | Status: DC | PRN
  Administered 2018-03-20: 100 mg via EPIDURAL

## 2018-03-20 MED ORDER — LACTATED RINGERS IV SOLN
INTRAVENOUS | Status: DC
  Administered 2018-03-20 (×2): via INTRAVENOUS

## 2018-03-20 MED ORDER — CEFAZOLIN SODIUM-DEXTROSE 2-4 GM/100ML-% IV SOLN
2.0000 g | INTRAVENOUS | Status: AC
  Administered 2018-03-20: 2 g via INTRAVENOUS

## 2018-03-20 MED ORDER — ROCURONIUM BROMIDE 50 MG/5ML IV SOSY
PREFILLED_SYRINGE | INTRAVENOUS | Status: AC
  Filled 2018-03-20: qty 5

## 2018-03-20 MED ORDER — KCL IN DEXTROSE-NACL 20-5-0.45 MEQ/L-%-% IV SOLN
INTRAVENOUS | Status: DC
  Administered 2018-03-20 – 2018-03-28 (×12): via INTRAVENOUS
  Filled 2018-03-20 (×12): qty 1000

## 2018-03-20 MED ORDER — SUGAMMADEX SODIUM 200 MG/2ML IV SOLN
INTRAVENOUS | Status: DC | PRN
  Administered 2018-03-20: 150 mg via INTRAVENOUS
  Administered 2018-03-20: 50 mg via INTRAVENOUS

## 2018-03-20 MED ORDER — ONDANSETRON 4 MG PO TBDP: 4.0000 mg | ORAL_TABLET | Freq: Four times a day (QID) | ORAL | Status: DC | PRN

## 2018-03-20 MED ORDER — EVICEL 5 ML EX KIT
PACK | CUTANEOUS | Status: AC
  Filled 2018-03-20: qty 1

## 2018-03-20 MED ORDER — KETAMINE HCL 50 MG/5ML IJ SOSY
PREFILLED_SYRINGE | INTRAMUSCULAR | Status: AC
  Filled 2018-03-20: qty 5

## 2018-03-20 MED ORDER — PHENOL 1.4 % MT LIQD
1.0000 | OROMUCOSAL | Status: DC | PRN
  Filled 2018-03-20: qty 177

## 2018-03-20 MED ORDER — ROPIVACAINE HCL 2 MG/ML IJ SOLN
4.0000 mL/h | INTRAMUSCULAR | Status: DC
  Administered 2018-03-20: 4 mL/h via EPIDURAL
  Filled 2018-03-20: qty 200

## 2018-03-20 MED ORDER — DIPHENHYDRAMINE HCL 12.5 MG/5ML PO ELIX: 12.5000 mg | ORAL_SOLUTION | Freq: Four times a day (QID) | ORAL | Status: DC | PRN

## 2018-03-20 MED ORDER — MEPERIDINE HCL 50 MG/ML IJ SOLN: 6.2500 mg | INTRAMUSCULAR | Status: DC | PRN

## 2018-03-20 MED ORDER — CEFAZOLIN SODIUM-DEXTROSE 2-4 GM/100ML-% IV SOLN
INTRAVENOUS | Status: AC
  Filled 2018-03-20: qty 100

## 2018-03-20 MED ORDER — ACETAMINOPHEN 500 MG PO TABS
ORAL_TABLET | ORAL | Status: AC
  Filled 2018-03-20: qty 2

## 2018-03-20 MED ORDER — ACETAMINOPHEN 10 MG/ML IV SOLN
1000.0000 mg | Freq: Four times a day (QID) | INTRAVENOUS | Status: AC
  Administered 2018-03-20 – 2018-03-21 (×4): 1000 mg via INTRAVENOUS
  Filled 2018-03-20 (×4): qty 100

## 2018-03-20 MED ORDER — EPHEDRINE SULFATE 50 MG/ML IJ SOLN
INTRAMUSCULAR | Status: DC | PRN
  Administered 2018-03-20: 5 mg via INTRAVENOUS

## 2018-03-20 MED ORDER — LACTATED RINGERS IV SOLN
INTRAVENOUS | Status: DC | PRN
  Administered 2018-03-20: 09:00:00 via INTRAVENOUS

## 2018-03-20 MED ORDER — ACETAMINOPHEN 10 MG/ML IV SOLN
INTRAVENOUS | Status: DC | PRN
  Administered 2018-03-20: 1000 mg via INTRAVENOUS

## 2018-03-20 MED ORDER — FAMOTIDINE IN NACL 20-0.9 MG/50ML-% IV SOLN
20.0000 mg | INTRAVENOUS | Status: DC
  Administered 2018-03-20 – 2018-03-25 (×6): 20 mg via INTRAVENOUS
  Filled 2018-03-20 (×6): qty 50

## 2018-03-20 MED ORDER — LIDOCAINE-EPINEPHRINE (PF) 1.5 %-1:200000 IJ SOLN
INTRAMUSCULAR | Status: DC | PRN
  Administered 2018-03-20: 75 mg via EPIDURAL

## 2018-03-20 MED ORDER — LIDOCAINE HCL 1 % IJ SOLN
INTRAMUSCULAR | Status: AC
  Filled 2018-03-20: qty 20

## 2018-03-20 MED ORDER — DEXAMETHASONE SODIUM PHOSPHATE 10 MG/ML IJ SOLN
INTRAMUSCULAR | Status: DC | PRN
  Administered 2018-03-20: 8 mg via INTRAVENOUS

## 2018-03-20 MED ORDER — LIDOCAINE IN D5W 4-5 MG/ML-% IV SOLN
1.0000 mg/min | INTRAVENOUS | Status: DC
  Filled 2018-03-20: qty 500

## 2018-03-20 MED ORDER — ONDANSETRON HCL 4 MG/2ML IJ SOLN
INTRAMUSCULAR | Status: DC | PRN
  Administered 2018-03-20: 4 mg via INTRAVENOUS

## 2018-03-20 MED ORDER — PROPOFOL 10 MG/ML IV BOLUS
INTRAVENOUS | Status: DC | PRN
  Administered 2018-03-20: 80 mg via INTRAVENOUS
  Administered 2018-03-20: 120 mg via INTRAVENOUS

## 2018-03-20 MED ORDER — ACETAMINOPHEN 10 MG/ML IV SOLN
INTRAVENOUS | Status: AC
  Filled 2018-03-20: qty 100

## 2018-03-20 MED ORDER — FENTANYL CITRATE (PF) 250 MCG/5ML IJ SOLN
INTRAMUSCULAR | Status: AC
  Filled 2018-03-20: qty 5

## 2018-03-20 MED ORDER — KETAMINE HCL 10 MG/ML IJ SOLN
INTRAMUSCULAR | Status: DC | PRN
  Administered 2018-03-20: 25 mg via INTRAVENOUS

## 2018-03-20 MED ORDER — GABAPENTIN 300 MG PO CAPS
ORAL_CAPSULE | ORAL | Status: AC
  Administered 2018-03-20: 300 mg via ORAL
  Filled 2018-03-20: qty 1

## 2018-03-20 MED ORDER — PROPOFOL 10 MG/ML IV BOLUS
INTRAVENOUS | Status: AC
  Filled 2018-03-20: qty 20

## 2018-03-20 MED ORDER — DEXMEDETOMIDINE HCL 200 MCG/2ML IV SOLN
INTRAVENOUS | Status: DC | PRN
  Administered 2018-03-20: 4 ug via INTRAVENOUS

## 2018-03-20 MED ORDER — GABAPENTIN 300 MG PO CAPS
300.0000 mg | ORAL_CAPSULE | ORAL | Status: AC
  Administered 2018-03-20: 300 mg via ORAL

## 2018-03-20 MED ORDER — LIDOCAINE HCL (PF) 1 % IJ SOLN
INTRAMUSCULAR | Status: AC
  Filled 2018-03-20: qty 30

## 2018-03-20 MED ORDER — THYROID 30 MG PO TABS
30.0000 mg | ORAL_TABLET | Freq: Every day | ORAL | Status: DC
  Administered 2018-03-22 – 2018-03-28 (×6): 30 mg via ORAL
  Filled 2018-03-20 (×7): qty 1

## 2018-03-20 MED ORDER — ROCURONIUM BROMIDE 10 MG/ML (PF) SYRINGE
PREFILLED_SYRINGE | INTRAVENOUS | Status: DC | PRN
  Administered 2018-03-20: 50 mg via INTRAVENOUS
  Administered 2018-03-20: 5 mg via INTRAVENOUS

## 2018-03-20 MED ORDER — CHLORHEXIDINE GLUCONATE CLOTH 2 % EX PADS: 6.0000 | MEDICATED_PAD | Freq: Once | CUTANEOUS | Status: DC

## 2018-03-20 MED ORDER — WATER FOR IRRIGATION, STERILE IR SOLN
Status: DC | PRN
  Administered 2018-03-20: 2000 mL

## 2018-03-20 MED ORDER — FENTANYL CITRATE (PF) 100 MCG/2ML IJ SOLN: 25.0000 ug | INTRAMUSCULAR | Status: DC | PRN

## 2018-03-20 MED ORDER — 0.9 % SODIUM CHLORIDE (POUR BTL) OPTIME
TOPICAL | Status: DC | PRN
  Administered 2018-03-20: 1000 mL

## 2018-03-20 MED ORDER — SODIUM CHLORIDE 0.9 % IV SOLN
INTRAVENOUS | Status: DC | PRN
  Administered 2018-03-20: 10 ug/min via INTRAVENOUS

## 2018-03-20 MED ORDER — FENTANYL CITRATE (PF) 100 MCG/2ML IJ SOLN
12.5000 ug | INTRAMUSCULAR | Status: DC | PRN
  Administered 2018-03-22 – 2018-03-23 (×5): 25 ug via INTRAVENOUS
  Administered 2018-03-23: 12.5 ug via INTRAVENOUS
  Administered 2018-03-23 – 2018-03-26 (×7): 25 ug via INTRAVENOUS
  Filled 2018-03-20 (×13): qty 2

## 2018-03-20 SURGICAL SUPPLY — 119 items
BAG BILE T-TUBES STRL (MISCELLANEOUS) IMPLANT
BENZOIN TINCTURE PRP APPL 2/3 (GAUZE/BANDAGES/DRESSINGS) ×3 IMPLANT
BLADE CLIPPER SURG (BLADE) ×3 IMPLANT
BOOT SUTURE AID YELLOW STND (SUTURE) IMPLANT
CANISTER SUCT 3000ML PPV (MISCELLANEOUS) ×3 IMPLANT
CATH KIT ON Q 7.5IN SLV (PAIN MANAGEMENT) IMPLANT
CATH ROBINSON RED A/P 16FR (CATHETERS) IMPLANT
CELLS DAT CNTRL 66122 CELL SVR (MISCELLANEOUS) ×2 IMPLANT
CHLORAPREP W/TINT 26ML (MISCELLANEOUS) IMPLANT
CLIP VESOCCLUDE LG 6/CT (CLIP) IMPLANT
CLIP VESOCCLUDE MED 24/CT (CLIP) IMPLANT
CLIP VESOLOCK LG 6/CT PURPLE (CLIP) IMPLANT
CLIP VESOLOCK MED 6/CT (CLIP) IMPLANT
CLIP VESOLOCK MED LG 6/CT (CLIP) IMPLANT
CONT SPEC 4OZ CLIKSEAL STRL BL (MISCELLANEOUS) IMPLANT
COVER MAYO STAND STRL (DRAPES) ×3 IMPLANT
COVER SURGICAL LIGHT HANDLE (MISCELLANEOUS) ×3 IMPLANT
COVER WAND RF STERILE (DRAPES) ×3 IMPLANT
DECANTER SPIKE VIAL GLASS SM (MISCELLANEOUS) ×6 IMPLANT
DERMABOND ADVANCED (GAUZE/BANDAGES/DRESSINGS) ×1
DERMABOND ADVANCED .7 DNX12 (GAUZE/BANDAGES/DRESSINGS) ×2 IMPLANT
DRAIN CHANNEL 19F RND (DRAIN) ×3 IMPLANT
DRAIN PENROSE 1/2X36 STERILE (WOUND CARE) IMPLANT
DRAPE LAPAROSCOPIC ABDOMINAL (DRAPES) ×3 IMPLANT
DRAPE WARM FLUID 44X44 (DRAPE) ×6 IMPLANT
DRSG COVADERM 4X10 (GAUZE/BANDAGES/DRESSINGS) IMPLANT
DRSG COVADERM 4X14 (GAUZE/BANDAGES/DRESSINGS) IMPLANT
DRSG COVADERM 4X6 (GAUZE/BANDAGES/DRESSINGS) IMPLANT
DRSG COVADERM 4X8 (GAUZE/BANDAGES/DRESSINGS) ×3 IMPLANT
DRSG TEGADERM 2-3/8X2-3/4 SM (GAUZE/BANDAGES/DRESSINGS) ×3 IMPLANT
DRSG TELFA 3X8 NADH (GAUZE/BANDAGES/DRESSINGS) ×3 IMPLANT
ELECT BLADE 4.0 EZ CLEAN MEGAD (MISCELLANEOUS)
ELECT BLADE 6.5 EXT (BLADE) ×3 IMPLANT
ELECT CAUTERY BLADE 6.4 (BLADE) ×3 IMPLANT
ELECT REM PT RETURN 9FT ADLT (ELECTROSURGICAL) ×3
ELECTRODE BLDE 4.0 EZ CLN MEGD (MISCELLANEOUS) IMPLANT
ELECTRODE REM PT RTRN 9FT ADLT (ELECTROSURGICAL) ×2 IMPLANT
GAUZE 4X4 16PLY RFD (DISPOSABLE) IMPLANT
GAUZE SPONGE 2X2 8PLY STRL LF (GAUZE/BANDAGES/DRESSINGS) ×2 IMPLANT
GAUZE SPONGE 4X4 12PLY STRL (GAUZE/BANDAGES/DRESSINGS) ×3 IMPLANT
GEL ULTRASOUND 20GR AQUASONIC (MISCELLANEOUS) IMPLANT
GLOVE BIO SURGEON STRL SZ 6 (GLOVE) ×12 IMPLANT
GLOVE INDICATOR 6.5 STRL GRN (GLOVE) ×12 IMPLANT
GOWN STRL REUS W/ TWL LRG LVL3 (GOWN DISPOSABLE) ×8 IMPLANT
GOWN STRL REUS W/TWL 2XL LVL3 (GOWN DISPOSABLE) ×6 IMPLANT
GOWN STRL REUS W/TWL LRG LVL3 (GOWN DISPOSABLE) ×4
HEMOSTAT SURGICEL 2X14 (HEMOSTASIS) IMPLANT
KIT BASIN OR (CUSTOM PROCEDURE TRAY) ×3 IMPLANT
KIT MARKER MARGIN INK (KITS) IMPLANT
KIT TOURNIQUET VASCULAR (KITS) IMPLANT
KIT TUBE JEJUNAL 16FR (CATHETERS) ×3 IMPLANT
KIT TURNOVER KIT B (KITS) ×3 IMPLANT
L-HOOK LAP DISP 36CM (ELECTROSURGICAL) ×6
LHOOK LAP DISP 36CM (ELECTROSURGICAL) ×4 IMPLANT
LOOP VESSEL MAXI BLUE (MISCELLANEOUS) IMPLANT
LOOP VESSEL MINI RED (MISCELLANEOUS) IMPLANT
NEEDLE BIOPSY 14X6 SOFT TISS (NEEDLE) IMPLANT
NS IRRIG 1000ML POUR BTL (IV SOLUTION) ×6 IMPLANT
PACK GENERAL/GYN (CUSTOM PROCEDURE TRAY) ×3 IMPLANT
PAD ARMBOARD 7.5X6 YLW CONV (MISCELLANEOUS) ×6 IMPLANT
PAD SHARPS MAGNETIC DISPOSAL (MISCELLANEOUS) IMPLANT
PENCIL BUTTON HOLSTER BLD 10FT (ELECTRODE) IMPLANT
PENCIL SMOKE EVACUATOR (MISCELLANEOUS) ×6 IMPLANT
PLUG CATH AND CAP STER (CATHETERS) IMPLANT
RELOAD PROXIMATE 75MM BLUE (ENDOMECHANICALS) IMPLANT
RELOAD PROXIMATE 75MM GREEN (ENDOMECHANICALS) IMPLANT
RTRCTR WOUND ALEXIS 18CM MED (MISCELLANEOUS) ×3
SCISSORS LAP 5X35 DISP (ENDOMECHANICALS) ×3 IMPLANT
SEPRAFILM PROCEDURAL PACK 3X5 (MISCELLANEOUS) IMPLANT
SET IRRIG TUBING LAPAROSCOPIC (IRRIGATION / IRRIGATOR) ×3 IMPLANT
SHEARS FOC LG CVD HARMONIC 17C (MISCELLANEOUS) ×3 IMPLANT
SLEEVE ENDOPATH XCEL 5M (ENDOMECHANICALS) ×6 IMPLANT
SPONGE GAUZE 2X2 STER 10/PKG (GAUZE/BANDAGES/DRESSINGS) ×1
SPONGE INTESTINAL PEANUT (DISPOSABLE) IMPLANT
SPONGE LAP 18X18 RF (DISPOSABLE) ×3 IMPLANT
SPONGE LAP 18X18 X RAY DECT (DISPOSABLE) ×6 IMPLANT
SPONGE SURGIFOAM ABS GEL 100 (HEMOSTASIS) IMPLANT
STAPLER PROXIMATE 75MM BLUE (STAPLE) ×3 IMPLANT
STAPLER VISISTAT 35W (STAPLE) ×3 IMPLANT
STRIP CLOSURE SKIN 1/2X4 (GAUZE/BANDAGES/DRESSINGS) ×3 IMPLANT
STRIP CLOSURE SKIN 1/4X4 (GAUZE/BANDAGES/DRESSINGS) ×3 IMPLANT
SUCTION POOLE TIP (SUCTIONS) ×3 IMPLANT
SUT 5.0 PDS RB-1 (SUTURE)
SUT ETHILON 2 0 FS 18 (SUTURE) ×3 IMPLANT
SUT ETHILON 2 LR (SUTURE) IMPLANT
SUT MNCRL AB 4-0 PS2 18 (SUTURE) ×6 IMPLANT
SUT PDS AB 1 TP1 96 (SUTURE) ×6 IMPLANT
SUT PDS AB 3-0 SH 27 (SUTURE) ×6 IMPLANT
SUT PDS AB 4-0 RB1 27 (SUTURE) IMPLANT
SUT PDS PLUS AB 5-0 RB-1 (SUTURE) IMPLANT
SUT PROLENE 3 0 SH 48 (SUTURE) IMPLANT
SUT PROLENE 4 0 RB 1 (SUTURE)
SUT PROLENE 4-0 RB1 .5 CRCL 36 (SUTURE) IMPLANT
SUT PROLENE 5 0 RB 1 DA (SUTURE) IMPLANT
SUT SILK 2 0 SH CR/8 (SUTURE) ×3 IMPLANT
SUT SILK 2 0 TIES 10X30 (SUTURE) IMPLANT
SUT SILK 3 0 SH CR/8 (SUTURE) ×6 IMPLANT
SUT SILK 3 0 TIES 10X30 (SUTURE) IMPLANT
SUT VIC AB 2-0 CT1 27 (SUTURE)
SUT VIC AB 2-0 CT1 TAPERPNT 27 (SUTURE) IMPLANT
SUT VIC AB 2-0 SH 18 (SUTURE) IMPLANT
SUT VIC AB 3-0 SH 18 (SUTURE) IMPLANT
SUT VIC AB 3-0 SH 27 (SUTURE) ×1
SUT VIC AB 3-0 SH 27X BRD (SUTURE) ×2 IMPLANT
SUT VIC AB 3-0 SH 8-18 (SUTURE) ×3 IMPLANT
SUT VICRYL AB 2 0 TIES (SUTURE) IMPLANT
SYRINGE IRR TOOMEY STRL 70CC (SYRINGE) ×3 IMPLANT
TAPE UMBILICAL 1/8 X36 TWILL (MISCELLANEOUS) IMPLANT
TOWEL OR 17X24 6PK STRL BLUE (TOWEL DISPOSABLE) ×3 IMPLANT
TOWEL OR 17X26 10 PK STRL BLUE (TOWEL DISPOSABLE) ×3 IMPLANT
TRAY FOLEY MTR SLVR 14FR STAT (SET/KITS/TRAYS/PACK) ×3 IMPLANT
TRAY LAPAROSCOPIC MC (CUSTOM PROCEDURE TRAY) ×3 IMPLANT
TROCAR XCEL BLUNT TIP 100MML (ENDOMECHANICALS) IMPLANT
TROCAR XCEL NON-BLD 11X100MML (ENDOMECHANICALS) IMPLANT
TROCAR XCEL NON-BLD 5MMX100MML (ENDOMECHANICALS) ×3 IMPLANT
TUBE FEEDING 8FR 16IN STR KANG (MISCELLANEOUS) IMPLANT
TUBE FEEDING ENTERAL 5FR 16IN (TUBING) IMPLANT
TUBING INSUFFLATION (TUBING) ×3 IMPLANT
TUNNELER SHEATH ON-Q 16GX12 DP (PAIN MANAGEMENT) IMPLANT

## 2018-03-20 NOTE — Anesthesia Procedure Notes (Signed)
Epidural Patient location during procedure: pre-op Start time: 03/20/2018 8:45 AM End time: 03/20/2018 8:50 AM  Staffing Anesthesiologist: Janeece Riggers, MD  Preanesthetic Checklist Completed: patient identified, site marked, surgical consent, pre-op evaluation, timeout performed, IV checked, risks and benefits discussed and monitors and equipment checked  Epidural Patient position: sitting Prep: site prepped and draped and DuraPrep Patient monitoring: continuous pulse ox and blood pressure Approach: midline Location: thoracic (1-12) Injection technique: LOR air  Needle:  Needle type: Tuohy  Needle gauge: 17 G Needle length: 9 cm and 9 Needle insertion depth: 4 cm Catheter type: closed end flexible Catheter size: 19 Gauge Catheter at skin depth: 11 cm Test dose: negative  Assessment Events: blood not aspirated, injection not painful, no injection resistance, negative IV test and no paresthesia

## 2018-03-20 NOTE — Anesthesia Procedure Notes (Signed)
Arterial Line Insertion Start/End10/02/2018 8:15 AM, 03/20/2018 8:29 AM Performed by: Jearld Pies, CRNA, CRNA  Patient location: Pre-op. Preanesthetic checklist: patient identified, IV checked, site marked, risks and benefits discussed, surgical consent, monitors and equipment checked, pre-op evaluation, timeout performed and anesthesia consent Lidocaine 1% used for infiltration Left, radial was placed Catheter size: 20 G Hand hygiene performed , maximum sterile barriers used  and Seldinger technique used Allen's test indicative of satisfactory collateral circulation Attempts: 1 Procedure performed without using ultrasound guided technique. Following insertion, dressing applied and Biopatch. Post procedure assessment: normal  Patient tolerated the procedure well with no immediate complications.

## 2018-03-20 NOTE — Op Note (Signed)
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    SURGEON:  Surgeon(s): [name], MD  ASSISTANT:   Romana Juniper, MD  ANESTHESIA:   epidural and general  DRAINS: Jejunostomy Tube   LOCAL MEDICATIONS USED:  BUPIVICAINE  and LIDOCAINE   SPECIMEN:  Source of Specimen:  liver biopsy  DISPOSITION OF SPECIMEN:  PATHOLOGY  COUNTS:  YES  DICTATION: .Dragon Dictation  PLAN OF CARE: Admit to inpatient   PATIENT DISPOSITION:  PACU - hemodynamically stable.  FINDINGS:  Several firm nodules in left lateral segment of liver.  3 implants in the pelvis.    EBL: <50 mL  PROCEDURE:  Patient was identified in the holding area and taken to the operating room where she was placed supine on the operating room table.  General endotracheal anesthesia was induced.  A Foley catheter was placed as well as pneumatic compression hoses.  Her left arm was tucked and her abdomen was then prepped and draped in sterile fashion.  A timeout was performed according to the surgical safety checklist.  When all was correct, we continued.  Patient was placed into reverse Trendelenburg position and rotated to the right.  Local anesthetic was administered at the left costal margin and a 5 mm Optiview port was placed under direct visualization.  Pneumoperitoneum was achieved to a pressure of 15 mmHg.  There were adhesions to the midline of the abdomen from a previous umbilical hernia repair.  A suspicious lesion was seen on the left side of the liver.  A second 5 mm trocar was placed in the left lateral abdomen.  The 2 masses that were seen were biopsied with a laparoscopic biopsy forceps and sent for frozen section.  Cautery was used to  achieve hemostasis.  Sharp dissection was used to take down the adhesions to the abdominal wall.  A third 5 mm port was then placed in the periumbilical location.  The omentum was stuck in the pelvis and this was carefully dissected sharply from the anterior abdominal wall.  The omentum was retracted superiorly.  There did not appear to be any buckling at the root of the mesentery at the inferior aspect of the pancreas.  The duodenum appeared mobile.  The frozen section actually came back very quickly.  It was positive for metastatic adenocarcinoma to the liver.  Because the patient was having so much trouble eating with pain, the decision was made to perform a gastrojejunostomy as well as a feeding jejunostomy.  A small midline incision was made incorporating the periumbilical 5 mm port site.  The sac was opened.  The jejunum was pulled anterior to the colon loop gastrojejunostomy was created on the posterior surface of the stomach.  Stay sutures were placed with 3 oh silks.  A 75 mm GIA stapler was used to create the gastrojejunostomy.  The defect was closed using 3-0 PDS in Laurel fashion.  Prior to closure of the defect, the nasogastric tube was advanced into the afferent limb.    An appropriate site for the jejunostomy tube was identified.  A pursestring suture was placed with 3-0 silk in the jejunum.  The J-tube was advanced through the left lateral port site that is Artie been created.  The bowel was opened at the site of the pursestring and the feeding tube was  advanced distally.  A 1 mL water was placed in the balloon.  The tube was then Witzeled with 3-0 silk.  This was pulled up to the abdominal wall and pexed with interrupted 2-0 silk.    The abdomen was then irrigated with water.  The jejunostomy tube was flushed also with water.  A few sites of oozing on the omentum were oversewn with 2-0 silk.  The fascia was then closed using running #1 looped PDS suture x2.  The skin was then irrigated and  closed with interrupted 3-0 Vicryl and running 4-0 Monocryl.  The wounds were cleaned, dried, and dressed with benzoin and Steri-Strips.  The patient was allowed to emerge from anesthesia and was taken to the PACU in stable condition.  Needle, sponge, and instrument counts were correct x2.

## 2018-03-20 NOTE — Anesthesia Procedure Notes (Addendum)
Procedure Name: Intubation Date/Time: 03/20/2018 9:25 AM Performed by: Jearld Pies, CRNA Pre-anesthesia Checklist: Patient identified, Emergency Drugs available, Suction available and Patient being monitored Patient Re-evaluated:Patient Re-evaluated prior to induction Oxygen Delivery Method: Circle System Utilized Preoxygenation: Pre-oxygenation with 100% oxygen Induction Type: IV induction, Rapid sequence and Cricoid Pressure applied Laryngoscope Size: Miller and 2 Grade View: Grade I Tube type: Oral Tube size: 7.0 mm Number of attempts: 1 Airway Equipment and Method: Stylet and Oral airway Placement Confirmation: ETT inserted through vocal cords under direct vision,  positive ETCO2 and breath sounds checked- equal and bilateral Secured at: 21 cm Tube secured with: Tape Dental Injury: Teeth and Oropharynx as per pre-operative assessment

## 2018-03-20 NOTE — Anesthesia Postprocedure Evaluation (Signed)
Anesthesia Post Note  Patient: Janice [name]  Procedure(s) Performed: LAPAROSCOPY DIAGNOSTIC  (N/A Abdomen) LYSIS OF ADHESION (N/A Abdomen) GASTROJEJUNOSTOMY WITH JEJUNAL FEEDING TUBE PLACEMENT (N/A Abdomen)     Patient location during evaluation: PACU Anesthesia Type: General Level of consciousness: awake and alert Pain management: pain level controlled Vital Signs Assessment: post-procedure vital signs reviewed and stable Respiratory status: spontaneous breathing, nonlabored ventilation, respiratory function stable and patient connected to nasal cannula oxygen Cardiovascular status: blood pressure returned to baseline and stable Postop Assessment: no apparent nausea or vomiting Anesthetic complications: no    Last Vitals:  Vitals:   03/20/18 1320 03/20/18 1835  BP: (!) 113/54 (!) 135/59  Pulse: 70 65  Resp: 15 15  Temp:  36.8 C  SpO2: 100% 100%    Last Pain:  Vitals:   03/20/18 1954  TempSrc:   PainSc: 4                  Tarrance Januszewski

## 2018-03-20 NOTE — Interval H&P Note (Signed)
History and Physical Interval Note:  03/20/2018 9:04 AM  Kem Boroughs  has presented today for surgery, with the diagnosis of pancreatic cancer  The various methods of treatment have been discussed with the patient and family. After consideration of risks, benefits and other options for treatment, the patient has consented to  Procedure(s): LAPAROSCOPY DIAGNOSTIC (N/A) WHIPPLE PROCEDURE (N/A) as a surgical intervention .  The patient's history has been reviewed, patient examined, no change in status, stable for surgery.  I have reviewed the patient's chart and labs.  Questions were answered to the patient's satisfaction.     [name]

## 2018-03-20 NOTE — Social Work (Signed)
CSW acknowledging consult for SNF placement. Will follow for therapy recommendations.   Nathanyal Ashmead H Dmitry Macomber, LCSWA [facility] Clinical Social Work [phone]   

## 2018-03-20 NOTE — Care Management Note (Signed)
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    Action/Plan:  Patient from home alone. Recently moved to [place] from [place].  ANticipate SNF at DC.   Expected Discharge Date:                  Expected Discharge Plan:  [employer]  In-House Referral:     Discharge planning Services  CM Consult  Post Acute Care Choice:    Choice offered to:     DME Arranged:    DME Agency:     HH Arranged:    [place] Agency:     Status of Service:  In process, will continue to follow  If discussed at Long Length of Stay Meetings, dates discussed:    Additional Comments:  Janice Collet, RN 03/20/2018, 2:18 PM

## 2018-03-20 NOTE — Transfer of Care (Signed)
Immediate Anesthesia Transfer of Care Note  Patient: Janice [name]  Procedure(s) Performed: LAPAROSCOPY DIAGNOSTIC  (N/A Abdomen) LYSIS OF ADHESION (N/A Abdomen) GASTROJEJUNOSTOMY WITH JEJUNAL FEEDING TUBE PLACEMENT (N/A Abdomen)  Patient Location: PACU  Anesthesia Type:General and Regional  Level of Consciousness: drowsy, patient cooperative and responds to stimulation  Airway & Oxygen Therapy: Patient Spontanous Breathing and Patient connected to nasal cannula oxygen  Post-op Assessment: Report given to RN, Post -op Vital signs reviewed and stable, Patient moving all extremities and Patient moving all extremities X 4  Post vital signs: Reviewed and stable  Last Vitals:  Vitals Value Taken Time  BP 89/51 03/20/2018 11:53 AM  Temp 36.1 C 03/20/2018 11:51 AM  Pulse 69 03/20/2018 11:57 AM  Resp 13 03/20/2018 11:57 AM  SpO2 100 % 03/20/2018 11:57 AM  Vitals shown include unvalidated device data.  Last Pain:  Vitals:   03/20/18 0803  TempSrc:   PainSc: 0-No pain      Patients Stated Pain Goal: 2 (09/47/09 6283)  Complications: No apparent anesthesia complications

## 2018-03-21 ENCOUNTER — Encounter (HOSPITAL_COMMUNITY): Payer: Self-pay | Admitting: General Surgery

## 2018-03-21 LAB — COMPREHENSIVE METABOLIC PANEL
ALK PHOS: 42 U/L (ref 38–126)
ALT: 24 U/L (ref 0–44)
AST: 42 U/L — ABNORMAL HIGH (ref 15–41)
Albumin: 2.7 g/dL — ABNORMAL LOW (ref 3.5–5.0)
Anion gap: 6 (ref 5–15)
BILIRUBIN TOTAL: 0.7 mg/dL (ref 0.3–1.2)
BUN: 6 mg/dL — ABNORMAL LOW (ref 8–23)
CALCIUM: 8.7 mg/dL — AB (ref 8.9–10.3)
CO2: 24 mmol/L (ref 22–32)
Chloride: 107 mmol/L (ref 98–111)
Creatinine, Ser: 0.55 mg/dL (ref 0.44–1.00)
GLUCOSE: 172 mg/dL — AB (ref 70–99)
Potassium: 4.6 mmol/L (ref 3.5–5.1)
Sodium: 137 mmol/L (ref 135–145)
TOTAL PROTEIN: 5.3 g/dL — AB (ref 6.5–8.1)

## 2018-03-21 LAB — CBC
HEMATOCRIT: 34 % — AB (ref 36.0–46.0)
HEMOGLOBIN: 10.9 g/dL — AB (ref 12.0–15.0)
MCH: 30.2 pg (ref 26.0–34.0)
MCHC: 32.1 g/dL (ref 30.0–36.0)
MCV: 94.2 fL (ref 80.0–100.0)
Platelets: 193 10*3/uL (ref 150–400)
RBC: 3.61 MIL/uL — ABNORMAL LOW (ref 3.87–5.11)
RDW: 12.8 % (ref 11.5–15.5)
WBC: 11.3 10*3/uL — ABNORMAL HIGH (ref 4.0–10.5)
nRBC: 0 % (ref 0.0–0.2)

## 2018-03-21 LAB — PROTIME-INR
INR: 1.17
PROTHROMBIN TIME: 14.8 s (ref 11.4–15.2)

## 2018-03-21 LAB — GLUCOSE, CAPILLARY
Glucose-Capillary: 109 mg/dL — ABNORMAL HIGH (ref 70–99)
Glucose-Capillary: 140 mg/dL — ABNORMAL HIGH (ref 70–99)
Glucose-Capillary: 141 mg/dL — ABNORMAL HIGH (ref 70–99)

## 2018-03-21 LAB — MAGNESIUM: MAGNESIUM: 1.9 mg/dL (ref 1.7–2.4)

## 2018-03-21 LAB — PHOSPHORUS: PHOSPHORUS: 2.9 mg/dL (ref 2.5–4.6)

## 2018-03-21 LAB — PREALBUMIN: Prealbumin: 12.9 mg/dL — ABNORMAL LOW (ref 18–38)

## 2018-03-21 MED ORDER — OSMOLITE 1.2 CAL PO LIQD
1000.0000 mL | ORAL | Status: DC
  Administered 2018-03-21: 1000 mL
  Filled 2018-03-21: qty 1000

## 2018-03-21 MED ORDER — METHOCARBAMOL 1000 MG/10ML IJ SOLN
500.0000 mg | Freq: Three times a day (TID) | INTRAVENOUS | Status: DC | PRN
  Administered 2018-03-21 – 2018-03-25 (×9): 500 mg via INTRAVENOUS
  Filled 2018-03-21: qty 500
  Filled 2018-03-21 (×2): qty 5
  Filled 2018-03-21: qty 500
  Filled 2018-03-21 (×6): qty 5

## 2018-03-21 MED ORDER — ACETAMINOPHEN 10 MG/ML IV SOLN
1000.0000 mg | Freq: Once | INTRAVENOUS | Status: AC
  Administered 2018-03-21: 1000 mg via INTRAVENOUS
  Filled 2018-03-21: qty 100

## 2018-03-21 MED ORDER — JEVITY 1.2 CAL PO LIQD
1000.0000 mL | ORAL | Status: DC
  Filled 2018-03-21: qty 1000

## 2018-03-21 MED ORDER — JEVITY 1.2 CAL PO LIQD: 1000.0000 mL | ORAL | Status: DC

## 2018-03-21 MED ORDER — SALINE SPRAY 0.65 % NA SOLN: 1.0000 | NASAL | Status: DC | PRN

## 2018-03-21 NOTE — Progress Notes (Signed)
Pt is c/o shoulder pain BP 116/53 P 68 R 18 afebrile Pain adequately controlled with TEA Will plan to pull the catheter tomorrow.

## 2018-03-21 NOTE — Progress Notes (Signed)
Initial Nutrition Assessment  DOCUMENTATION CODES:   Non-severe (moderate) malnutrition in context of chronic illness  INTERVENTION:   -Initiate Osmolite 1.2 @ 10 ml/hr via j-tube to provide 288 kcal (17% of needs), 13 grams of protein, and 197 ml of H2O.   Recommend advance slowly to goal rate of 60 ml/r, per MD discretion  Tube feeding regimen provides 1728 kcal (100% of needs), 80 grams of protein, and 1181 ml of H2O.   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  GOAL:   Patient will meet greater than or equal to 90% of their needs  MONITOR:   PO intake, Supplement acceptance, Diet advancement, Labs, Weight trends, Skin, I & O's  REASON FOR ASSESSMENT:   Malnutrition Screening Tool, Consult Enteral/tube feeding initiation and management  ASSESSMENT:   77 yo F referred by Dr. Benson Norway for a new diagnosis of pancreatic cancer 02/2018.  She presented with intermittent lower abdominal pain in August 2019 and thought it was constipation. She saw GI and due to concerns of possible diverticulitis flare, a CT was perfomed.  An incidental pancreatic mass was noted.  This was further worked up wtih MRI, EUS/biopsy, and chest CT.  Biopsies were positive for adenocarcinoma.  Staging was negative for metastatic disease.  She denies jaundice or diabetes.  She has no diarrhea.  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   10/8- s/p rocedure(s): Diagnostic laparoscopy, liver biopsy, gastrojejunostomy, jejunostomy feeding tube placement.    NGT: 200 ml output  x 24 hours  Reviewed I/O's: +1.4 L since admission  Spoke with pt and daughter at bedside. Pt reports she has always been petite, UBW around 130#. She typically has a good appetite, but noticed a general decline over the past 6 weeks, which she first noticed when she ate a large amount of trail mix when cleaning her house, which caused her abdominal pain. Progressively her appetite has diminished- pt reports consuming mainly Ensure, applesauce, and pudding over the past several weeks related to abdominal pain and early satiety. She also reports losing about 10-15# over the past month, however, wt hx not available to confirm this.   Pt reports she is typically very active, but has become very sedentary due to weakness.   Pt with NGT connected to low intermittent suction. Pt daughter verbalizes plan to start TF today (orders have been placed, but pt does not recollect this conversation). Had pt locate her j-tube and explained to pt how she will receive her nutrition. Discussed potential plan for diet progression and potential needs for continued TF depending on hospital course. Pt anxious about the potential of having to use feeding tube at home. Discussed with pt that this may be a possibility, however, plans and education will provided at discharge depending on hospital course.   Labs reviewed: K, Mg, Phos WDL.   NUTRITION - FOCUSED PHYSICAL EXAM:    Most Recent Value  Orbital Region  Moderate depletion  Upper Arm Region  Moderate depletion  Thoracic and Lumbar Region  Mild depletion  Buccal Region  Mild depletion  Temple  Region  Mild depletion  Clavicle Bone Region  Moderate depletion  Clavicle and Acromion Bone Region  Moderate depletion  Scapular Bone Region  Mild depletion  Dorsal Hand  Moderate depletion  Patellar Region  Moderate depletion  Anterior Thigh Region  Moderate depletion  Posterior Calf Region  Moderate depletion  Edema (RD Assessment)  None  Hair  Reviewed  Eyes   Reviewed  Mouth  Reviewed  Skin  Reviewed  Nails  Reviewed       Diet Order:   Diet Order            Diet NPO time specified  Diet effective now              EDUCATION NEEDS:   Education needs have been addressed  Skin:  Skin Assessment: Skin Integrity Issues: Skin Integrity Issues:: Incisions Incisions: closed abdomen  Last BM:  PTA  Height:   Ht Readings from Last 1 Encounters:  03/20/18 5\' 2"  (1.575 m)    Weight:   Wt Readings from Last 1 Encounters:  03/20/18 55.2 kg    Ideal Body Weight:  50 kg  BMI:  Body mass index is 22.24 kg/m.  Estimated Nutritional Needs:   Kcal:  1650-1850  Protein:  80-95 grams  Fluid:  >1.6 L    Jestin Burbach A. Jimmye Norman, RD, LDN, CDE Pager: [phone] After hours Pager: [phone]

## 2018-03-21 NOTE — Progress Notes (Signed)
1 Day Post-Op   Subjective/Chief Complaint: C/o left ear pain and bilateral shoulder pain.     Objective: Vital signs in last 24 hours: Temp:  [97.6 F (36.4 C)-98.8 F (37.1 C)] 98.5 F (36.9 C) (10/10 1101) Pulse Rate:  [64-72] 64 (10/10 1101) Resp:  [12-20] 17 (10/10 1101) BP: (102-135)/(47-62) 107/62 (10/10 1101) SpO2:  [98 %-100 %] 99 % (10/10 1101) Arterial Line BP: (69-126)/(45-61) 69/61 (10/09 1305)    Intake/Output from previous day: 10/09 0701 - 10/10 0700 In: 3534.4 [I.V.:2673.4; NG/GT:60; IV Piggyback:626] Out: 2160 [Urine:1850; Emesis/NG output:200; Blood:105] Intake/Output this shift: No intake/output data recorded.  General appearance: alert, cooperative and no distress Resp: breathing comfortably GI: soft, non distended, approp tender at incision. Extremities: extremities normal, atraumatic, no cyanosis or edema  Lab Results:  Recent Labs    03/20/18 1358 03/21/18 0441  WBC 6.7 11.3*  HGB 11.5* 10.9*  HCT 35.0* 34.0*  PLT 178 193   BMET Recent Labs    03/20/18 0957 03/20/18 1358 03/21/18 0441  NA 139  --  137  K 3.5  --  4.6  CL  --   --  107  CO2  --   --  24  GLUCOSE  --   --  172*  BUN  --   --  6*  CREATININE  --  0.59 0.55  CALCIUM  --   --  8.7*   PT/INR Recent Labs    03/21/18 0441  LABPROT 14.8  INR 1.17   ABG Recent Labs    03/20/18 0957  PHART 7.387  HCO3 26.6    Studies/Results: No results found.  Anti-infectives: Anti-infectives (From admission, onward)   Start     Dose/Rate Route Frequency Ordered Stop   03/20/18 0745  ceFAZolin (ANCEF) IVPB 2g/100 mL premix     2 g 200 mL/hr over 30 Minutes Intravenous On call to O.R. 03/20/18 0737 03/20/18 0935   03/20/18 0740  ceFAZolin (ANCEF) 2-4 GM/100ML-% IVPB    Note to Pharmacy:  Barbie Haggis   : cabinet override      03/20/18 0740 03/20/18 0935      Assessment/Plan: s/p Procedure(s): LAPAROSCOPY DIAGNOSTIC  (N/A) LYSIS OF ADHESION (N/A) GASTROJEJUNOSTOMY  WITH JEJUNAL FEEDING TUBE PLACEMENT (N/A) keep foley today for monitoring as well as epidural.  Anticipate d/c epidural, foley, and NGT tomorrow barring unforeseen circumstances. Epidural for pain control Start J tube feeds today. Chloraseptic for throat pain.   Muscle relaxant for shoulder pain. OOB  Stage IV pancreatic cancer.   LOS: 1 day    [name] 03/21/2018

## 2018-03-22 DIAGNOSIS — E440 Moderate protein-calorie malnutrition: Secondary | ICD-10-CM

## 2018-03-22 LAB — CBC
HEMATOCRIT: 32.1 % — AB (ref 36.0–46.0)
Hemoglobin: 10.1 g/dL — ABNORMAL LOW (ref 12.0–15.0)
MCH: 30.1 pg (ref 26.0–34.0)
MCHC: 31.5 g/dL (ref 30.0–36.0)
MCV: 95.8 fL (ref 80.0–100.0)
PLATELETS: 165 10*3/uL (ref 150–400)
RBC: 3.35 MIL/uL — ABNORMAL LOW (ref 3.87–5.11)
RDW: 13.2 % (ref 11.5–15.5)
WBC: 9 10*3/uL (ref 4.0–10.5)
nRBC: 0 % (ref 0.0–0.2)

## 2018-03-22 LAB — BASIC METABOLIC PANEL
Anion gap: 7 (ref 5–15)
BUN: 7 mg/dL — ABNORMAL LOW (ref 8–23)
CALCIUM: 8.4 mg/dL — AB (ref 8.9–10.3)
CO2: 24 mmol/L (ref 22–32)
Chloride: 108 mmol/L (ref 98–111)
Creatinine, Ser: 0.54 mg/dL (ref 0.44–1.00)
GFR calc non Af Amer: >60 mL/min (ref >60)
Glucose, Bld: 133 mg/dL — ABNORMAL HIGH (ref 70–99)
Potassium: 4 mmol/L (ref 3.5–5.1)
Sodium: 139 mmol/L (ref 135–145)

## 2018-03-22 LAB — GLUCOSE, CAPILLARY
GLUCOSE-CAPILLARY: 151 mg/dL — AB (ref 70–99)
Glucose-Capillary: 92 mg/dL (ref 70–99)
Glucose-Capillary: 124 mg/dL — ABNORMAL HIGH (ref 70–99)
Glucose-Capillary: 118 mg/dL — ABNORMAL HIGH (ref 70–99)
Glucose-Capillary: 144 mg/dL — ABNORMAL HIGH (ref 70–99)

## 2018-03-22 MED ORDER — OXYCODONE HCL 5 MG/5ML PO SOLN
5.0000 mg | ORAL | Status: DC | PRN
  Administered 2018-03-22 – 2018-03-24 (×9): 5 mg
  Filled 2018-03-22 (×10): qty 5

## 2018-03-22 MED ORDER — ACETAMINOPHEN 325 MG PO TABS
325.0000 mg | ORAL_TABLET | Freq: Four times a day (QID) | ORAL | Status: DC | PRN
  Administered 2018-03-24: 650 mg via ORAL
  Filled 2018-03-22: qty 2

## 2018-03-22 MED ORDER — OSMOLITE 1.2 CAL PO LIQD
1000.0000 mL | ORAL | Status: DC
  Administered 2018-03-22: 1000 mL
  Filled 2018-03-22 (×2): qty 1000

## 2018-03-22 NOTE — Progress Notes (Signed)
Nutrition Follow-up  DOCUMENTATION CODES:   Non-severe (moderate) malnutrition in context of chronic illness  INTERVENTION:   Continue Osmolite 1.2 @ 20 ml/hr via j-tube and increase by 10 ml every 6 hours to goal rate of 60 ml/hr.   Tube feeding regimen provides 1728 kcal (100% of needs), 80 grams of protein, and 1181 ml of H2O.   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  Ongoing  GOAL:   Patient will meet greater than or equal to 90% of their needs  Progressing  MONITOR:   PO intake, Supplement acceptance, Diet advancement, Labs, Weight trends, Skin, I & O's  REASON FOR ASSESSMENT:   Malnutrition Screening Tool, Consult Enteral/tube feeding initiation and management  ASSESSMENT:   77 yo F referred by Dr. Benson Norway for a new diagnosis of pancreatic cancer 02/2018.  She presented with intermittent lower abdominal pain in August 2019 and thought it was constipation. She saw GI and due to concerns of possible diverticulitis flare, a CT was perfomed.  An incidental pancreatic mass was noted.  This was further worked up wtih MRI, EUS/biopsy, and chest CT.  Biopsies were positive for adenocarcinoma.  Staging was negative for metastatic disease.  She denies jaundice or diabetes.  She has no diarrhea.  She feels reasonably energetic and does what she wants to do with minimal limitation  10/8- s/p rocedure(s): Diagnostic laparoscopy, liver biopsy, gastrojejunostomy, jejunostomy feeding tube placement  NGT output: 400 ml x 24 hours  Chart reviewed. Pt advanced to a clear liquid diet. Noted PO 25%.   TF advanced to 20 ml/hr today. This is providing 576 kcals, 27 grams protein, and 394 ml  free water daily (meeting 35% of estimated kcal needs and 34% of estimated protein needs).   Per MD orders plan to advance TF by 10 ml/hr every 6 hours to goal rate of 60 ml/hr.   Labs reviewed: CBGS: 92-141.   Diet Order:   Diet Order            Diet clear liquid Room service appropriate? Yes; Fluid consistency: Thin  Diet effective now              EDUCATION NEEDS:   Education needs have been addressed  Skin:  Skin Assessment: Skin Integrity Issues: Skin Integrity Issues:: Incisions Incisions: closed abdomen  Last BM:  PTA  Height:   Ht Readings from Last 1 Encounters:  03/20/18 5\' 2"  (1.575 m)    Weight:   Wt Readings from Last 1 Encounters:  03/22/18 59.7 kg    Ideal Body Weight:  50 kg  BMI:  Body mass index is 24.07 kg/m.  Estimated Nutritional Needs:   Kcal:  1650-1850  Protein:  80-95 grams  Fluid:  >1.6 L    Carlo Guevarra A. Jimmye Norman, RD, LDN, CDE Pager: [phone] After hours Pager: [phone]

## 2018-03-22 NOTE — Progress Notes (Signed)
Anesthesiology Note:  Epidural catheter d/ced. Site OK, tip intact.  Diandre Merica 

## 2018-03-22 NOTE — Telephone Encounter (Signed)
LM on VM that OOP would be $0 estimated cost for genetic testing. Left CB information

## 2018-03-22 NOTE — Addendum Note (Signed)
Addendum  created 03/22/18 0935 by Roberts Gaudy, MD   Sign clinical note

## 2018-03-22 NOTE — Evaluation (Signed)
Physical Therapy Evaluation Patient Details Name: Janice [name] MRN: [id number] DOB: 1941-01-16 Today's Date: 03/22/2018   History of Present Illness  77yo female with new diagnosis of pancreatic cancer, negative for metastatic disease. She received laparoscopy, lysis of adhesion, and gastrojejunostomy with J tube placement on 03/20/18. PMH CVA, head injury, hx brain surgery   Clinical Impression  Patient received in bed, pleasant and willing to work with PT but very fatigued this morning. Able to complete all functional mobility including bed mobility, functional transfers, and gait very short distances in room (limited due to significant fatigue) with no device and close S, VC for safety and line management. She was left up in the chair with all needs met, RN aware of patient status and mobility. She will continue to benefit from skilled PT services in the acute setting, as well as skilled HHPT services moving forward.     Follow Up Recommendations Home health PT    Equipment Recommendations  3in1 (PT)    Recommendations for Other Services       Precautions / Restrictions Precautions Precautions: Fall;Other (comment) Precaution Comments: at eval, epidural, J-tube, and NG tube  Restrictions Weight Bearing Restrictions: No      Mobility  Bed Mobility Overal bed mobility: Needs Assistance Bed Mobility: Supine to Sit     Supine to sit: Supervision     General bed mobility comments: S for bed mobility, assist for line management, cues for safety with multiple lines   Transfers Overall transfer level: Needs assistance Equipment used: None Transfers: Sit to/from Stand;Stand Pivot Transfers Sit to Stand: Supervision Stand pivot transfers: Supervision       General transfer comment: no device for transfers, min guard assist, cues for safety and line management   Ambulation/Gait Ambulation/Gait assistance: Supervision Gait Distance (Feet): 3 Feet Assistive device:  None Gait Pattern/deviations: Step-through pattern;Decreased step length - right;Decreased step length - left;Decreased stride length;Trunk flexed;Narrow base of support Gait velocity: decreased    General Gait Details: from [facility] to chair, did not progress ambulation today due to high levels of fatigue but patient able to complete short distances in room with general S for safety   Stairs            Wheelchair Mobility    Modified Rankin (Stroke Patients Only)       Balance Overall balance assessment: Mild deficits observed, not formally tested                                           Pertinent Vitals/Pain Pain Assessment: 0-10 Pain Score: 7  Pain Location: throat  Pain Descriptors / Indicators: Aching;Sore Pain Intervention(s): Limited activity within patient's tolerance;Monitored during session    [place] expects to be discharged to:: Private residence Living Arrangements: Alone Available Help at Discharge: Family;Available PRN/intermittently Type of Home: House Home Access: Stairs to enter Entrance Stairs-Rails: Right(on the back ) Entrance Stairs-Number of Steps: 3 front enterance, 2 back enterance  Home Layout: One level Home Equipment: None      Prior Function Level of Independence: Independent         Comments: fully independent      Hand Dominance        Extremity/Trunk Assessment   Upper Extremity Assessment Upper Extremity Assessment: Generalized weakness    Lower Extremity Assessment Lower Extremity Assessment: Generalized weakness    Cervical / Trunk  Assessment Cervical / Trunk Assessment: Normal  Communication   Communication: HOH  Cognition Arousal/Alertness: Awake/alert Behavior During Therapy: WFL for tasks assessed/performed Overall Cognitive Status: Within Functional Limits for tasks assessed                                        General Comments      Exercises      Assessment/Plan    PT Assessment Patient needs continued PT services  PT Problem List Decreased strength;Decreased mobility;Decreased safety awareness;Decreased coordination;Decreased activity tolerance       PT Treatment Interventions DME instruction;Therapeutic activities;Gait training;Therapeutic exercise;Patient/family education;Stair training;Balance training;Functional mobility training;Neuromuscular re-education    PT Goals (Current goals can be found in the Care Plan section)  Acute Rehab PT Goals Patient Stated Goal: to get stronger, feel better  PT Goal Formulation: With patient Time For Goal Achievement: 04/05/18 Potential to Achieve Goals: Good    Frequency Min 3X/week   Barriers to discharge        Co-evaluation               AM-PAC PT "6 Clicks" Daily Activity  Outcome Measure Difficulty turning over in bed (including adjusting bedclothes, sheets and blankets)?: A Little Difficulty moving from lying on back to sitting on the side of the bed? : A Little Difficulty sitting down on and standing up from a chair with arms (e.g., wheelchair, bedside commode, etc,.)?: A Little Help needed moving to and from a bed to chair (including a wheelchair)?: A Little Help needed walking in hospital room?: A Little Help needed climbing 3-5 steps with a railing? : A Lot 6 Click Score: 17    End of Session   Activity Tolerance: Patient tolerated treatment well;Patient limited by fatigue Patient left: in chair;with call bell/phone within reach Nurse Communication: Mobility status PT Visit Diagnosis: Muscle weakness (generalized) (M62.81);Difficulty in walking, not elsewhere classified (R26.2)    Time: 3643-8377 PT Time Calculation (min) (ACUTE ONLY): 33 min   Charges:   PT Evaluation $PT Eval Moderate Complexity: 1 Mod PT Treatments $Therapeutic Activity: 8-22 mins         Deniece Ree PT, DPT, CBIS  Supplemental Physical Therapist [place]    Pager  [phone] Acute Rehab Office [phone]

## 2018-03-22 NOTE — NC FL2 (Signed)
[place] LEVEL OF CARE SCREENING TOOL     IDENTIFICATION  Patient Name: Janice [name] Birthdate: Aug 06, 1940 Sex: female Admission Date (Current Location): 03/20/2018  [facility] and [place] Number:  Herbalist and Address:  [facility] [place], [place] [address] [place], [place] [address]      Provider Number: [id number]  Attending Physician Name and Address:  [name], MD  Relative Name and Phone Number:  Vikki Ports; [phone]    Current Level of Care: Hospital Recommended Level of Care: [place] Prior Approval Number:    Date Approved/Denied:   PASRR Number: [id number]  Discharge Plan: SNF    Current Diagnoses: Patient Active Problem List   Diagnosis Date Noted  . Malnutrition of moderate degree 03/22/2018  . 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 Pancreatic cancer metastasized to liver ([place]) 03/20/2018  . Pancreatic adenocarcinoma ([place]) 02/25/2018  . Headache 12/17/2016  . Confusion 12/17/2016  . Hypothyroidism 12/17/2016  . Acute ischemic stroke ([place]) 12/17/2016  . Ischemic stroke of frontal lobe ([place]) 12/16/2016    Orientation RESPIRATION BLADDER Height & Weight     Self, Time, Situation, Place  Normal Continent, Indwelling catheter Weight: 131 lb 9.8 oz (59.7 kg) Height:  5\' 2"  (157.5 cm)  BEHAVIORAL SYMPTOMS/MOOD NEUROLOGICAL BOWEL NUTRITION STATUS      Continent Feeding tube(jejunostomy )  AMBULATORY STATUS COMMUNICATION OF NEEDS Skin   Limited Assist Verbally Surgical wounds(incision on abdomen with tape and gauze dressing)                       Personal Care Assistance Level of Assistance  Bathing, Feeding, Dressing Bathing Assistance: Limited assistance Feeding Assistance: Limited assistance (with tube feeds) Dressing Assistance: Limited assistance     Functional Limitations Info  Sight, Hearing, Speech Sight Info: Adequate Hearing Info: Adequate Speech Info:  Adequate    SPECIAL CARE FACTORS FREQUENCY  OT (By licensed OT), PT (By licensed PT)     PT Frequency: 5x week OT Frequency: 5x week            Contractures Contractures Info: Not present    Additional Factors Info  Code Status, Allergies Code Status Info: Full Code Allergies Info: Tape           Current Medications (03/22/2018):  This is the current hospital active medication list Current Facility-Administered Medications  Medication Dose Route Frequency Provider Last Rate Last Dose  . acetaminophen (TYLENOL) tablet 325-650 mg  325-650 mg Oral Q6H PRN [name], MD      . dextrose 5 % and 0.45 % NaCl with KCl 20 mEq/L infusion   Intravenous Continuous [name], MD 50 mL/hr at 03/22/18 0903    . diphenhydrAMINE (BENADRYL) 12.5 MG/5ML elixir 12.5 mg  12.5 mg Oral Q6H PRN [name], MD       Or  . diphenhydrAMINE (BENADRYL) injection 12.5 mg  12.5 mg Intravenous Q6H PRN [name], MD      . famotidine (PEPCID) IVPB 20 mg premix  20 mg Intravenous Q24H [name], MD 100 mL/hr at 03/21/18 1139 20 mg at 03/21/18 1139  . feeding supplement (OSMOLITE 1.2 CAL) liquid 1,000 mL  1,000 mL Per Tube Continuous [name], MD      . fentaNYL (SUBLIMAZE) injection 12.5-25 mcg  12.5-25 mcg Intravenous Q2H PRN [name], MD      . lactated ringers infusion   Intravenous Continuous [name], MD   Stopped  at 03/20/18 1157  . menthol-cetylpyridinium (CEPACOL) lozenge 3 mg  1 lozenge Oral PRN [name], MD      . methocarbamol (ROBAXIN) 500 mg in dextrose 5 % 50 mL IVPB  500 mg Intravenous Q8H PRN [name], MD   Stopped at 03/21/18 1658  . ondansetron (ZOFRAN-ODT) disintegrating tablet 4 mg  4 mg Oral Q6H PRN [name], MD       Or  . ondansetron (ZOFRAN) injection 4 mg  4 mg Intravenous Q6H PRN [name], MD      . oxyCODONE (ROXICODONE) 5 MG/5ML solution 5 mg  5 mg Per Tube Q4H PRN [name], MD      . phenol (CHLORASEPTIC) mouth spray 1  spray  1 spray Mouth/Throat PRN [name], MD      . ropivacaine (PF) 2 mg/mL (0.2%) (NAROPIN) injection  5 mL/hr Epidural Continuous Roberts Gaudy, MD 5 mL/hr at 03/22/18 0042 5 mL/hr at 03/22/18 0042  . sodium chloride (OCEAN) 0.65 % nasal spray 1 spray  1 spray Each Nare PRN [name], MD      . thyroid (ARMOUR) tablet 30 mg  30 mg Oral QAC breakfast [name], MD   30 mg at 03/22/18 0900     Discharge Medications: Please see discharge summary for a list of discharge medications.  Relevant Imaging Results:  Relevant Lab Results:   Additional Information [id number] [place], [place]

## 2018-03-22 NOTE — Progress Notes (Signed)
2 Days Post-Op   Subjective/Chief Complaint: Still having throat pain.    Objective: Vital signs in last 24 hours: Temp:  [97.7 F (36.5 C)-98.6 F (37 C)] 97.7 F (36.5 C) (10/11 0438) Pulse Rate:  [61-74] 61 (10/11 0438) Resp:  [16-18] 18 (10/11 0438) BP: (107-138)/(53-62) 128/61 (10/11 0438) SpO2:  [95 %-100 %] 100 % (10/11 0438) Weight:  [59.7 kg] 59.7 kg (10/11 0700) Last BM Date: 03/21/18  Intake/Output from previous day: 10/10 0701 - 10/11 0700 In: 2774.5 [I.V.:2432.2; NG/GT:128.8; IV Piggyback:100] Out: 3300 [Urine:2900; Emesis/NG output:400] Intake/Output this shift: No intake/output data recorded.  General appearance: alert, cooperative and no distress Resp: breathing comfortably GI: soft, non distended, approp tender at incision. Extremities: extremities normal, atraumatic, no cyanosis or edema  Lab Results:  Recent Labs    03/21/18 0441 03/22/18 0211  WBC 11.3* 9.0  HGB 10.9* 10.1*  HCT 34.0* 32.1*  PLT 193 165   BMET Recent Labs    03/21/18 0441 03/22/18 0211  NA 137 139  K 4.6 4.0  CL 107 108  CO2 24 24  GLUCOSE 172* 133*  BUN 6* 7*  CREATININE 0.55 0.54  CALCIUM 8.7* 8.4*   PT/INR Recent Labs    03/21/18 0441  LABPROT 14.8  INR 1.17   ABG Recent Labs    03/20/18 0957  PHART 7.387  HCO3 26.6    Studies/Results: No results found.  Anti-infectives: Anti-infectives (From admission, onward)   Start     Dose/Rate Route Frequency Ordered Stop   03/20/18 0745  ceFAZolin (ANCEF) IVPB 2g/100 mL premix     2 g 200 mL/hr over 30 Minutes Intravenous On call to O.R. 03/20/18 0737 03/20/18 0935   03/20/18 0740  ceFAZolin (ANCEF) 2-4 GM/100ML-% IVPB    Note to Pharmacy:  Barbie Haggis   : cabinet override      03/20/18 0740 03/20/18 0935      Assessment/Plan: s/p Procedure(s): LAPAROSCOPY DIAGNOSTIC  (N/A) LYSIS OF ADHESION (N/A) GASTROJEJUNOSTOMY WITH JEJUNAL FEEDING TUBE PLACEMENT (N/A)  D/c epidural today and foley tomorrow  at 6 am.  Will leave foley until then due to diuresis.    Advance J tube feeds to goal  Chloraseptic for throat pain.   Muscle relaxant for shoulder pain. OOB Decrease IV Fluids.   Enteric pain control - oxy and tylenol.  Fentanyl IV PRN.   Stage IV pancreatic cancer.   LOS: 2 days    [name] 03/22/2018

## 2018-03-22 NOTE — Care Management Important Message (Signed)
Important Message  Patient Details  Name: Janice [name] MRN: [id number] Date of Birth: 08-22-1940   Medicare Important Message Given:  Yes    Maddie Brazier Montine Circle 03/22/2018, 3:35 PM

## 2018-03-23 LAB — BASIC METABOLIC PANEL
Anion gap: 9 (ref 5–15)
CO2: 24 mmol/L (ref 22–32)
CREATININE: 0.44 mg/dL (ref 0.44–1.00)
Calcium: 9 mg/dL (ref 8.9–10.3)
Chloride: 103 mmol/L (ref 98–111)
GFR calc non Af Amer: >60 mL/min (ref >60)
GLUCOSE: 134 mg/dL — AB (ref 70–99)
Potassium: 4.1 mmol/L (ref 3.5–5.1)
Sodium: 136 mmol/L (ref 135–145)

## 2018-03-23 LAB — CBC
HEMATOCRIT: 34.1 % — AB (ref 36.0–46.0)
HEMOGLOBIN: 11.5 g/dL — AB (ref 12.0–15.0)
MCH: 31.9 pg (ref 26.0–34.0)
MCHC: 33.7 g/dL (ref 30.0–36.0)
MCV: 94.5 fL (ref 80.0–100.0)
NRBC: 0 % (ref 0.0–0.2)
Platelets: 220 10*3/uL (ref 150–400)
RBC: 3.61 MIL/uL — ABNORMAL LOW (ref 3.87–5.11)
RDW: 12.9 % (ref 11.5–15.5)
WBC: 9.7 10*3/uL (ref 4.0–10.5)

## 2018-03-23 LAB — GLUCOSE, CAPILLARY
GLUCOSE-CAPILLARY: 135 mg/dL — AB (ref 70–99)
Glucose-Capillary: 119 mg/dL — ABNORMAL HIGH (ref 70–99)
Glucose-Capillary: 131 mg/dL — ABNORMAL HIGH (ref 70–99)
Glucose-Capillary: 134 mg/dL — ABNORMAL HIGH (ref 70–99)
Glucose-Capillary: 137 mg/dL — ABNORMAL HIGH (ref 70–99)
Glucose-Capillary: 110 mg/dL — ABNORMAL HIGH (ref 70–99)

## 2018-03-23 MED ORDER — ALUM & MAG HYDROXIDE-SIMETH 200-200-20 MG/5ML PO SUSP
30.0000 mL | ORAL | Status: DC | PRN
  Administered 2018-03-23 – 2018-03-26 (×2): 30 mL via ORAL
  Filled 2018-03-23 (×2): qty 30

## 2018-03-23 NOTE — Progress Notes (Signed)
Patient has been unable to sleep, complains of back pain, cramping, and need to burp.  Has moved from bed to chair and back to bed but unable to get comfortable.  Wanted to try and walk in the halls around 0230 however stated very dizzy and it was recommended she continue sitting until sensation passed.  Patient nauseated, given Zofran q6h and recommended to use moistened swabs for mouth and hold off on ice cubes to alleviate nausea.

## 2018-03-23 NOTE — Evaluation (Signed)
Occupational Therapy Evaluation Patient Details Name: Janice [name] MRN: [id number] DOB: 11-Mar-1941 Today's Date: 03/23/2018    History of Present Illness 77yo female with new diagnosis of pancreatic cancer, negative for metastatic disease. She received laparoscopy, lysis of adhesion, and gastrojejunostomy with J tube placement on 03/20/18. PMH CVA, head injury, hx brain surgery    Clinical Impression   This 77 y/o female presents with the above. At baseline pt is independent with ADLs and functional mobility, lives alone. Pt is motivated to work with therapy to progress to PLOF, though limited due to significant fatigue with minimal activity and also with reports of dizziness while standing this session (BP taken and WNL). Pt currently requires minA for UB ADL, modA for LB ADLs, completing stand pivot transfer with minA using RW. Given pt's current status feel pt will benefit from [place] rehab services in SNF setting prior to return home to maximize her overall strength, activity tolerance, safety and independence with ADLs and mobility. Will continue to follow while she remains in acute setting to progress pt towards established OT goals.     Follow Up Recommendations  SNF(vs 24hr supervision (pending pt progress))    Equipment Recommendations  3 in 1 bedside commode;Other (comment)(to be further assessed)           Precautions / Restrictions Precautions Precautions: Fall;Other (comment) Precaution Comments: J-tube Restrictions Weight Bearing Restrictions: No      Mobility Bed Mobility Overal bed mobility: Needs Assistance Bed Mobility: Sit to Supine     Supine to sit: Min assist     General bed mobility comments: light minA to support LE once placed onto EOB to prevent from sliding back off; NT and therapist providing assist to boost to [facility]; pt with slow and effortful movements  Transfers Overall transfer level: Needs assistance Equipment used: Rolling walker (2  wheeled) Transfers: Stand Pivot Transfers   Stand pivot transfers: Min guard;Min assist       General transfer comment: pt standing in room with NT upon arrival having just finished using BSC; provided close minguard-light minA during transition back to EOB; pt with slow movements throughout     Balance Overall balance assessment: Needs assistance Sitting-balance support: Bilateral upper extremity supported;Feet supported Sitting balance-Leahy Scale: Fair     Standing balance support: Bilateral upper extremity supported;During functional activity Standing balance-Leahy Scale: Poor Standing balance comment: use of UE support on RW this session                           ADL either performed or assessed with clinical judgement   ADL Overall ADL's : Needs assistance/impaired Eating/Feeding: NPO(except for small sips of water/ice chips)   Grooming: Set up;Wash/dry face;Bed level   Upper Body Bathing: Min guard;Minimal assistance;Sitting   Lower Body Bathing: Moderate assistance;Sit to/from stand   Upper Body Dressing : Minimal assistance;Sitting   Lower Body Dressing: Moderate assistance;Sit to/from stand   Toilet Transfer: Min Geophysical data processor Details (indicate cue type and reason): close minguard for safety Toileting- Clothing Manipulation and Hygiene: Moderate assistance;Sit to/from stand Toileting - Clothing Manipulation Details (indicate cue type and reason): pt completing toileting using BSC with assist from NT upon arrival to room     Functional mobility during ADLs: Min guard;Minimal assistance;Rolling walker General ADL Comments: pt motivated to work with therapy though limited due to significant fatigue and dizziness with standing (BP taken and stable after return to sitting EOB)  Vision Baseline Vision/History: Wears glasses Wears Glasses: At all times Patient Visual Report: No change from baseline Vision Assessment?: No  apparent visual deficits     Perception     Praxis      Pertinent Vitals/Pain Pain Assessment: Faces Faces Pain Scale: Hurts even more Pain Location: low back Pain Descriptors / Indicators: Discomfort;Sore Pain Intervention(s): Monitored during session;Limited activity within patient's tolerance;Repositioned(pt with heat pack in room)     Hand Dominance     Extremity/Trunk Assessment Upper Extremity Assessment Upper Extremity Assessment: Generalized weakness   Lower Extremity Assessment Lower Extremity Assessment: Defer to PT evaluation       Communication Communication Communication: No difficulties   Cognition Arousal/Alertness: Awake/alert Behavior During Therapy: WFL for tasks assessed/performed Overall Cognitive Status: Within Functional Limits for tasks assessed                                     General Comments  pt with reports of dizziness while standing, BP taken after return to sitting EOB and WNL    Exercises     Shoulder Instructions      Home Living Family/patient expects to be discharged to:: Private residence Living Arrangements: Alone Available Help at Discharge: Family;Available PRN/intermittently Type of Home: House Home Access: Stairs to enter Entrance Stairs-Number of Steps: 3 front enterance, 2 back enterance  Entrance Stairs-Rails: Right(on the back ) Home Layout: One level     Bathroom Shower/Tub: Teacher, early years/pre: Handicapped height     Home Equipment: Grab bars - tub/shower          Prior Functioning/Environment Level of Independence: Independent        Comments: fully independent         OT Problem List: Decreased range of motion;Decreased strength;Decreased activity tolerance;Impaired balance (sitting and/or standing);Decreased knowledge of use of DME or AE;Pain      OT Treatment/Interventions: Self-care/ADL training;Therapeutic exercise;Neuromuscular education;DME and/or AE  instruction;Therapeutic activities;Patient/family education    OT Goals(Current goals can be found in the care plan section) Acute Rehab OT Goals Patient Stated Goal: wants to walk more OT Goal Formulation: With patient Time For Goal Achievement: 04/06/18 Potential to Achieve Goals: Good  OT Frequency: Min 2X/week   Barriers to D/C:            Co-evaluation              AM-PAC PT "6 Clicks" Daily Activity     Outcome Measure Help from another person eating meals?: Total(NPO) Help from another person taking care of personal grooming?: A Little Help from another person toileting, which includes using toliet, bedpan, or urinal?: A Lot Help from another person bathing (including washing, rinsing, drying)?: A Lot Help from another person to put on and taking off regular upper body clothing?: A Little Help from another person to put on and taking off regular lower body clothing?: A Lot 6 Click Score: 13   End of Session Equipment Utilized During Treatment: Rolling walker Nurse Communication: Mobility status  Activity Tolerance: Patient tolerated treatment well;Patient limited by fatigue Patient left: in bed;with call bell/phone within reach;with bed alarm set  OT Visit Diagnosis: Muscle weakness (generalized) (M62.81)                Time: 3557-3220 OT Time Calculation (min): 17 min Charges:  OT General Charges $OT Visit: 1 Visit OT Evaluation $OT Eval Moderate Complexity: 1  Mod  Lou Cal, [place] Pager [phone] Office [phone]   Raymondo Band 03/23/2018, 9:29 AM

## 2018-03-23 NOTE — Progress Notes (Signed)
PT Cancellation Note  Patient Details Name: Janice [name] MRN: [id number] DOB: February 20, 1941   Cancelled Treatment:    Reason Eval/Treat Not Completed: Fatigue/lethargy limiting ability to participate   Shanna Cisco 03/23/2018, 2:50 PM

## 2018-03-23 NOTE — Progress Notes (Signed)
Patient ID: Janice [name], female   DOB: 1940/12/15, 77 y.o.   MRN: [id number] [facility] Surgery Progress Note:   3 Days Post-Op  Subjective: Mental status is fairly alert;  Complaining of some back pain last night that kept her awake.  Also complaining of bloating Objective: Vital signs in last 24 hours: Temp:  [97.7 F (36.5 C)-98.4 F (36.9 C)] 97.8 F (36.6 C) (10/12 0543) Pulse Rate:  [61-77] 61 (10/12 0543) Resp:  [16-18] 16 (10/12 0543) BP: (127-161)/(57-69) 146/58 (10/12 0543) SpO2:  [98 %-100 %] 100 % (10/12 0543)  Intake/Output from previous day: 10/11 0701 - 10/12 0700 In: 3259.9 [P.O.:150; I.V.:1256.2; NG/GT:480; IV Piggyback:1338] Out: 2350 [Urine:1550; Emesis/NG output:800] Intake/Output this shift: Total I/O In: -  Out: 100 [Urine:100]  Physical Exam: Work of breathing is elevated when getting up and moving around.  No abdominal pain but bloating  Lab Results:  Results for orders placed or performed during the hospital encounter of 03/20/18 (from the past 48 hour(s))  Glucose, capillary     Status: Abnormal   Collection Time: 03/21/18  5:29 PM  Result Value Ref Range   Glucose-Capillary 109 (H) 70 - 99 mg/dL  Glucose, capillary     Status: Abnormal   Collection Time: 03/21/18  8:09 PM  Result Value Ref Range   Glucose-Capillary 140 (H) 70 - 99 mg/dL  Glucose, capillary     Status: Abnormal   Collection Time: 03/21/18 11:37 PM  Result Value Ref Range   Glucose-Capillary 141 (H) 70 - 99 mg/dL  CBC     Status: Abnormal   Collection Time: 03/22/18  2:11 AM  Result Value Ref Range   WBC 9.0 4.0 - 10.5 K/uL   RBC 3.35 (L) 3.87 - 5.11 MIL/uL   Hemoglobin 10.1 (L) 12.0 - 15.0 g/dL   HCT 32.1 (L) 36.0 - 46.0 %   MCV 95.8 80.0 - 100.0 fL   MCH 30.1 26.0 - 34.0 pg   MCHC 31.5 30.0 - 36.0 g/dL   RDW 13.2 11.5 - 15.5 %   Platelets 165 150 - 400 K/uL   nRBC 0.0 0.0 - 0.2 %    Comment: Performed at [facility], [place] [address] [place], [place]  [address]  Basic metabolic panel     Status: Abnormal   Collection Time: 03/22/18  2:11 AM  Result Value Ref Range   Sodium 139 135 - 145 mmol/L   Potassium 4.0 3.5 - 5.1 mmol/L   Chloride 108 98 - 111 mmol/L   CO2 24 22 - 32 mmol/L   Glucose, Bld 133 (H) 70 - 99 mg/dL   BUN 7 (L) 8 - 23 mg/dL   Creatinine, Ser 0.54 0.44 - 1.00 mg/dL   Calcium 8.4 (L) 8.9 - 10.3 mg/dL   GFR calc non Af Amer >60 >60 mL/min   GFR calc Af Amer >60 >60 mL/min    Comment: (NOTE) The eGFR has been calculated using the CKD EPI equation. This calculation has not been validated in all clinical situations. eGFR's persistently <60 mL/min signify possible Chronic Kidney Disease.    Anion gap 7 5 - 15    Comment: Performed at [facility] [address] [place], [place] [address]  Glucose, capillary     Status: None   Collection Time: 03/22/18  4:33 AM  Result Value Ref Range   Glucose-Capillary 92 70 - 99 mg/dL  Glucose, capillary     Status: Abnormal   Collection  Time: 03/22/18  8:12 AM  Result Value Ref Range   Glucose-Capillary 124 (H) 70 - 99 mg/dL  Glucose, capillary     Status: Abnormal   Collection Time: 03/22/18 12:13 PM  Result Value Ref Range   Glucose-Capillary 118 (H) 70 - 99 mg/dL  Glucose, capillary     Status: Abnormal   Collection Time: 03/22/18  4:42 PM  Result Value Ref Range   Glucose-Capillary 144 (H) 70 - 99 mg/dL  Glucose, capillary     Status: Abnormal   Collection Time: 03/22/18  8:48 PM  Result Value Ref Range   Glucose-Capillary 151 (H) 70 - 99 mg/dL  Glucose, capillary     Status: Abnormal   Collection Time: 03/23/18 12:39 AM  Result Value Ref Range   Glucose-Capillary 135 (H) 70 - 99 mg/dL  CBC     Status: Abnormal   Collection Time: 03/23/18  1:06 AM  Result Value Ref Range   WBC 9.7 4.0 - 10.5 K/uL   RBC 3.61 (L) 3.87 - 5.11 MIL/uL   Hemoglobin 11.5 (L) 12.0 - 15.0 g/dL   HCT 34.1 (L) 36.0 - 46.0 %   MCV 94.5 80.0 - 100.0 fL   MCH 31.9 26.0 - 34.0 pg   MCHC  33.7 30.0 - 36.0 g/dL   RDW 12.9 11.5 - 15.5 %   Platelets 220 150 - 400 K/uL   nRBC 0.0 0.0 - 0.2 %    Comment: Performed at [facility], [place] [address] [place], [place] [address]  Basic metabolic panel     Status: Abnormal   Collection Time: 03/23/18  1:06 AM  Result Value Ref Range   Sodium 136 135 - 145 mmol/L   Potassium 4.1 3.5 - 5.1 mmol/L    Comment: SLIGHT HEMOLYSIS   Chloride 103 98 - 111 mmol/L   CO2 24 22 - 32 mmol/L   Glucose, Bld 134 (H) 70 - 99 mg/dL   BUN <5 (L) 8 - 23 mg/dL   Creatinine, Ser 0.44 0.44 - 1.00 mg/dL   Calcium 9.0 8.9 - 10.3 mg/dL   GFR calc non Af Amer >60 >60 mL/min   GFR calc Af Amer >60 >60 mL/min    Comment: (NOTE) The eGFR has been calculated using the CKD EPI equation. This calculation has not been validated in all clinical situations. eGFR's persistently <60 mL/min signify possible Chronic Kidney Disease.    Anion gap 9 5 - 15    Comment: Performed at [facility] [address] [place], [place] [address]  Glucose, capillary     Status: Abnormal   Collection Time: 03/23/18  5:44 AM  Result Value Ref Range   Glucose-Capillary 119 (H) 70 - 99 mg/dL  Glucose, capillary     Status: Abnormal   Collection Time: 03/23/18  7:56 AM  Result Value Ref Range   Glucose-Capillary 131 (H) 70 - 99 mg/dL    Radiology/Results: No results found.  Anti-infectives: Anti-infectives (From admission, onward)   Start     Dose/Rate Route Frequency Ordered Stop   03/20/18 0745  ceFAZolin (ANCEF) IVPB 2g/100 mL premix     2 g 200 mL/hr over 30 Minutes Intravenous On call to O.R. 03/20/18 0737 03/20/18 0935   03/20/18 0740  ceFAZolin (ANCEF) 2-4 GM/100ML-% IVPB    Note to Pharmacy:  Barbie Haggis   : cabinet override      03/20/18 0740 03/20/18 0935      Assessment/Plan: Problem List: Patient Active  Problem List   Diagnosis Date Noted  . Malnutrition of moderate degree 03/22/2018  . 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 Pancreatic adenocarcinoma ([place]) 02/25/2018  . Headache 12/17/2016  . Confusion 12/17/2016  . Hypothyroidism 12/17/2016  . Acute ischemic stroke ([place]) 12/17/2016  . Ischemic stroke of frontal lobe ([place] [place] [place]) 12/16/2016    TF still at 20 cc/hr.  Until she has more return of bowel function then will keep it there.  Taking some sips of water PO.  3 Days Post-Op    LOS: 3 days   Matt B. Hassell Done, MD, [facility] [phone] beeper [phone]  03/23/2018 9:00 AM

## 2018-03-23 NOTE — Plan of Care (Signed)
  Problem: Education: Goal: Knowledge of General Education information will improve Description: Including pain rating scale, medication(s)/side effects and non-pharmacologic comfort measures Outcome: Progressing   Problem: Elimination: Goal: Will not experience complications related to urinary retention Outcome: Progressing   

## 2018-03-24 LAB — GLUCOSE, CAPILLARY
GLUCOSE-CAPILLARY: 108 mg/dL — AB (ref 70–99)
GLUCOSE-CAPILLARY: 114 mg/dL — AB (ref 70–99)
Glucose-Capillary: 122 mg/dL — ABNORMAL HIGH (ref 70–99)
Glucose-Capillary: 147 mg/dL — ABNORMAL HIGH (ref 70–99)
Glucose-Capillary: 133 mg/dL — ABNORMAL HIGH (ref 70–99)
Glucose-Capillary: 112 mg/dL — ABNORMAL HIGH (ref 70–99)

## 2018-03-24 LAB — CBC
HCT: 36.3 % (ref 36.0–46.0)
HEMOGLOBIN: 11.5 g/dL — AB (ref 12.0–15.0)
MCH: 30.2 pg (ref 26.0–34.0)
MCHC: 31.7 g/dL (ref 30.0–36.0)
MCV: 95.3 fL (ref 80.0–100.0)
NRBC: 0 % (ref 0.0–0.2)
Platelets: 223 10*3/uL (ref 150–400)
RBC: 3.81 MIL/uL — ABNORMAL LOW (ref 3.87–5.11)
RDW: 13 % (ref 11.5–15.5)
WBC: 7.7 10*3/uL (ref 4.0–10.5)

## 2018-03-24 LAB — BASIC METABOLIC PANEL
ANION GAP: 8 (ref 5–15)
BUN: <5 mg/dL — ABNORMAL LOW (ref 8–23)
CALCIUM: 9.1 mg/dL (ref 8.9–10.3)
CO2: 27 mmol/L (ref 22–32)
CREATININE: 0.47 mg/dL (ref 0.44–1.00)
Chloride: 103 mmol/L (ref 98–111)
Glucose, Bld: 119 mg/dL — ABNORMAL HIGH (ref 70–99)
Potassium: 3.6 mmol/L (ref 3.5–5.1)
SODIUM: 138 mmol/L (ref 135–145)

## 2018-03-24 MED ORDER — ACETAMINOPHEN 325 MG PO TABS: 325.0000 mg | ORAL_TABLET | Freq: Four times a day (QID) | ORAL | Status: DC | PRN

## 2018-03-24 MED ORDER — OXYCODONE HCL 5 MG/5ML PO SOLN
5.0000 mg | ORAL | Status: DC | PRN
  Administered 2018-03-24 – 2018-03-28 (×7): 5 mg via ORAL
  Filled 2018-03-24 (×10): qty 5

## 2018-03-24 MED ORDER — OSMOLITE 1.2 CAL PO LIQD
1000.0000 mL | ORAL | Status: DC
  Filled 2018-03-24: qty 1000

## 2018-03-24 MED ORDER — WHITE PETROLATUM EX OINT
TOPICAL_OINTMENT | CUTANEOUS | Status: AC
  Filled 2018-03-24: qty 28.35

## 2018-03-24 MED ORDER — ACETAMINOPHEN 160 MG/5ML PO SOLN
325.0000 mg | Freq: Four times a day (QID) | ORAL | Status: DC | PRN
  Administered 2018-03-24 – 2018-03-25 (×2): 650 mg via ORAL
  Filled 2018-03-24 (×2): qty 20.3

## 2018-03-24 NOTE — Progress Notes (Signed)
Patient ID: Janice [name], female   DOB: 1940/09/12, 77 y.o.   MRN: [id number] [facility] Surgery Progress Note:   4 Days Post-Op  Subjective: Mental status is clear;  Has friend present who stayed with her the night.  Had bad experience with night nursing tech who she felt was unkind to her.   Objective: Vital signs in last 24 hours: Temp:  [98.1 F (36.7 C)-98.7 F (37.1 C)] 98.2 F (36.8 C) (10/13 0400) Pulse Rate:  [59-71] 59 (10/13 0400) Resp:  [16-18] 16 (10/13 0400) BP: (127-151)/(57-69) 137/58 (10/13 0400) SpO2:  [100 %] 100 % (10/13 0400) Weight:  [60 kg] 60 kg (10/13 0500)  Intake/Output from previous day: 10/12 0701 - 10/13 0700 In: 1632.8 [P.O.:100; I.V.:1202.2; NG/GT:180; IV Piggyback:150.6] Out: 2600 [Urine:2600] Intake/Output this shift: Total I/O In: -  Out: 300 [Urine:300]  Physical Exam: Work of breathing is not labored.  Just back in bed.  Tube feedings at 20 cc/hr.  Will increase to 30 despite risk of nausea.  Incisions OK  Lab Results:  Results for orders placed or performed during the hospital encounter of 03/20/18 (from the past 48 hour(s))  Glucose, capillary     Status: Abnormal   Collection Time: 03/22/18 12:13 PM  Result Value Ref Range   Glucose-Capillary 118 (H) 70 - 99 mg/dL  Glucose, capillary     Status: Abnormal   Collection Time: 03/22/18  4:42 PM  Result Value Ref Range   Glucose-Capillary 144 (H) 70 - 99 mg/dL  Glucose, capillary     Status: Abnormal   Collection Time: 03/22/18  8:48 PM  Result Value Ref Range   Glucose-Capillary 151 (H) 70 - 99 mg/dL  Glucose, capillary     Status: Abnormal   Collection Time: 03/23/18 12:39 AM  Result Value Ref Range   Glucose-Capillary 135 (H) 70 - 99 mg/dL  CBC     Status: Abnormal   Collection Time: 03/23/18  1:06 AM  Result Value Ref Range   WBC 9.7 4.0 - 10.5 K/uL   RBC 3.61 (L) 3.87 - 5.11 MIL/uL   Hemoglobin 11.5 (L) 12.0 - 15.0 g/dL   HCT 34.1 (L) 36.0 - 46.0 %   MCV 94.5 80.0 -  100.0 fL   MCH 31.9 26.0 - 34.0 pg   MCHC 33.7 30.0 - 36.0 g/dL   RDW 12.9 11.5 - 15.5 %   Platelets 220 150 - 400 K/uL   nRBC 0.0 0.0 - 0.2 %    Comment: Performed at [facility], [place] [address] [place], [place] [address]  Basic metabolic panel     Status: Abnormal   Collection Time: 03/23/18  1:06 AM  Result Value Ref Range   Sodium 136 135 - 145 mmol/L   Potassium 4.1 3.5 - 5.1 mmol/L    Comment: SLIGHT HEMOLYSIS   Chloride 103 98 - 111 mmol/L   CO2 24 22 - 32 mmol/L   Glucose, Bld 134 (H) 70 - 99 mg/dL   BUN <5 (L) 8 - 23 mg/dL   Creatinine, Ser 0.44 0.44 - 1.00 mg/dL   Calcium 9.0 8.9 - 10.3 mg/dL   GFR calc non Af Amer >60 >60 mL/min   GFR calc Af Amer >60 >60 mL/min    Comment: (NOTE) The eGFR has been calculated using the CKD EPI equation. This calculation has not been validated in all clinical situations. eGFR's persistently <60 mL/min signify possible Chronic Kidney Disease.    Anion gap 9 5 -  15    Comment: Performed at [facility], [place] [address], [place], [place] [address]  Glucose, capillary     Status: Abnormal   Collection Time: 03/23/18  5:44 AM  Result Value Ref Range   Glucose-Capillary 119 (H) 70 - 99 mg/dL  Glucose, capillary     Status: Abnormal   Collection Time: 03/23/18  7:56 AM  Result Value Ref Range   Glucose-Capillary 131 (H) 70 - 99 mg/dL  Glucose, capillary     Status: Abnormal   Collection Time: 03/23/18 12:04 PM  Result Value Ref Range   Glucose-Capillary 134 (H) 70 - 99 mg/dL  Glucose, capillary     Status: Abnormal   Collection Time: 03/23/18  3:59 PM  Result Value Ref Range   Glucose-Capillary 137 (H) 70 - 99 mg/dL  Glucose, capillary     Status: Abnormal   Collection Time: 03/23/18  8:40 PM  Result Value Ref Range   Glucose-Capillary 110 (H) 70 - 99 mg/dL  Glucose, capillary     Status: Abnormal   Collection Time: 03/23/18 11:55 PM  Result Value Ref Range   Glucose-Capillary 108 (H) 70 - 99 mg/dL  CBC     Status:  Abnormal   Collection Time: 03/24/18  1:29 AM  Result Value Ref Range   WBC 7.7 4.0 - 10.5 K/uL   RBC 3.81 (L) 3.87 - 5.11 MIL/uL   Hemoglobin 11.5 (L) 12.0 - 15.0 g/dL   HCT 36.3 36.0 - 46.0 %   MCV 95.3 80.0 - 100.0 fL   MCH 30.2 26.0 - 34.0 pg   MCHC 31.7 30.0 - 36.0 g/dL   RDW 13.0 11.5 - 15.5 %   Platelets 223 150 - 400 K/uL   nRBC 0.0 0.0 - 0.2 %    Comment: Performed at [facility], [place] [address], [place], [place] [address]  Basic metabolic panel     Status: Abnormal   Collection Time: 03/24/18  1:29 AM  Result Value Ref Range   Sodium 138 135 - 145 mmol/L   Potassium 3.6 3.5 - 5.1 mmol/L   Chloride 103 98 - 111 mmol/L   CO2 27 22 - 32 mmol/L   Glucose, Bld 119 (H) 70 - 99 mg/dL   BUN <5 (L) 8 - 23 mg/dL   Creatinine, Ser 0.47 0.44 - 1.00 mg/dL   Calcium 9.1 8.9 - 10.3 mg/dL   GFR calc non Af Amer >60 >60 mL/min   GFR calc Af Amer >60 >60 mL/min    Comment: (NOTE) The eGFR has been calculated using the CKD EPI equation. This calculation has not been validated in all clinical situations. eGFR's persistently <60 mL/min signify possible Chronic Kidney Disease.    Anion gap 8 5 - 15    Comment: Performed at [facility] [address], [place], [place] [address]  Glucose, capillary     Status: Abnormal   Collection Time: 03/24/18  8:18 AM  Result Value Ref Range   Glucose-Capillary 114 (H) 70 - 99 mg/dL    Radiology/Results: No results found.  Anti-infectives: Anti-infectives (From admission, onward)   Start     Dose/Rate Route Frequency Ordered Stop   03/20/18 0745  ceFAZolin (ANCEF) IVPB 2g/100 mL premix     2 g 200 mL/hr over 30 Minutes Intravenous On call to O.R. 03/20/18 0737 03/20/18 0935   03/20/18 0740  ceFAZolin (ANCEF) 2-4 GM/100ML-% IVPB    Note to Pharmacy:  Barbie Haggis   :  cabinet override      03/20/18 0740 03/20/18 0935      Assessment/Plan: Problem List: Patient Active Problem List   Diagnosis Date Noted  . Malnutrition  of moderate degree 03/22/2018  . 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 Pancreatic adenocarcinoma ([place]) 02/25/2018  . Headache 12/17/2016  . Confusion 12/17/2016  . Hypothyroidism 12/17/2016  . Acute ischemic stroke ([place]) 12/17/2016  . Ischemic stroke of frontal lobe ([place]) 12/16/2016    Pain control and nutrition continue to be a challenge.  She is having some abdominal cramps probably related to ileus resolution.   4 Days Post-Op    LOS: 4 days   Matt B. Hassell Done, MD, [facility] [phone] beeper [phone]  03/24/2018 9:07 AM

## 2018-03-25 LAB — GLUCOSE, CAPILLARY
GLUCOSE-CAPILLARY: 131 mg/dL — AB (ref 70–99)
GLUCOSE-CAPILLARY: 109 mg/dL — AB (ref 70–99)
GLUCOSE-CAPILLARY: 145 mg/dL — AB (ref 70–99)
Glucose-Capillary: 111 mg/dL — ABNORMAL HIGH (ref 70–99)
Glucose-Capillary: 146 mg/dL — ABNORMAL HIGH (ref 70–99)
Glucose-Capillary: 147 mg/dL — ABNORMAL HIGH (ref 70–99)
Glucose-Capillary: 157 mg/dL — ABNORMAL HIGH (ref 70–99)

## 2018-03-25 LAB — CBC
HEMATOCRIT: 34.1 % — AB (ref 36.0–46.0)
HEMOGLOBIN: 11.2 g/dL — AB (ref 12.0–15.0)
MCH: 31 pg (ref 26.0–34.0)
MCHC: 32.8 g/dL (ref 30.0–36.0)
MCV: 94.5 fL (ref 80.0–100.0)
Platelets: 241 10*3/uL (ref 150–400)
RBC: 3.61 MIL/uL — ABNORMAL LOW (ref 3.87–5.11)
RDW: 12.9 % (ref 11.5–15.5)
WBC: 6.9 10*3/uL (ref 4.0–10.5)
nRBC: 0 % (ref 0.0–0.2)

## 2018-03-25 LAB — BASIC METABOLIC PANEL
Anion gap: 9 (ref 5–15)
BUN: 7 mg/dL — ABNORMAL LOW (ref 8–23)
CHLORIDE: 103 mmol/L (ref 98–111)
CO2: 25 mmol/L (ref 22–32)
CREATININE: 0.5 mg/dL (ref 0.44–1.00)
Calcium: 8.6 mg/dL — ABNORMAL LOW (ref 8.9–10.3)
GFR calc non Af Amer: >60 mL/min (ref >60)
GLUCOSE: 138 mg/dL — AB (ref 70–99)
Potassium: 3.8 mmol/L (ref 3.5–5.1)
Sodium: 137 mmol/L (ref 135–145)

## 2018-03-25 MED ORDER — IBUPROFEN 100 MG/5ML PO SUSP
400.0000 mg | Freq: Three times a day (TID) | ORAL | Status: DC | PRN
  Administered 2018-03-25 (×2): 400 mg via ORAL
  Filled 2018-03-25 (×4): qty 20

## 2018-03-25 MED ORDER — METHOCARBAMOL 500 MG PO TABS
500.0000 mg | ORAL_TABLET | Freq: Three times a day (TID) | ORAL | Status: DC | PRN
  Administered 2018-03-25 (×2): 500 mg via ORAL
  Filled 2018-03-25 (×2): qty 1

## 2018-03-25 MED ORDER — OSMOLITE 1.2 CAL PO LIQD
1000.0000 mL | ORAL | Status: DC
  Filled 2018-03-25 (×4): qty 1000

## 2018-03-25 NOTE — Progress Notes (Signed)
Physical Therapy Treatment Patient Details Name: Janice [name] MRN: [id number] DOB: 14-Jul-1940 Today's Date: 03/25/2018    History of Present Illness 77yo female with new diagnosis of pancreatic cancer, negative for metastatic disease. She received laparoscopy, lysis of adhesion, and gastrojejunostomy with J tube placement on 03/20/18. PMH CVA, head injury, hx brain surgery     PT Comments    Pt reluctant to participate in therapy today, however with encouragement from her daughter agrees to participate. Pt is limited in safe mobility by fatigue and generalized weakness. Pt currently requires supervision for bed mobility, transfers and ambulation with RW. PT recommendations remain appropriate at this time. PT will continue to follow.   Follow Up Recommendations  Home health PT     Equipment Recommendations  None recommended by PT       Precautions / Restrictions Precautions Precautions: Fall;Other (comment) Precaution Comments: J-tube Restrictions Weight Bearing Restrictions: No    Mobility  Bed Mobility Overal bed mobility: Needs Assistance Bed Mobility: Sit to Supine     Supine to sit: Supervision;HOB elevated     General bed mobility comments: supervison for safety, increased time and effort coming to EoB  Transfers Overall transfer level: Needs assistance Equipment used: Rolling walker (2 wheeled) Transfers: Sit to/from Stand Sit to Stand: Supervision         General transfer comment: supervision for safety  Ambulation/Gait Ambulation/Gait assistance: Supervision Gait Distance (Feet): 1000 Feet Assistive device: Rolling walker (2 wheeled) Gait Pattern/deviations: Step-through pattern;Decreased stride length;Trunk flexed Gait velocity: decreased  Gait velocity interpretation: 1.31 - 2.62 ft/sec, indicative of limited community ambulator General Gait Details: despite c/o fatigue pt able to ambulate increased distance today.        Balance Overall  balance assessment: Needs assistance Sitting-balance support: Bilateral upper extremity supported;Feet supported Sitting balance-Leahy Scale: Fair     Standing balance support: No upper extremity supported Standing balance-Leahy Scale: Fair                              Cognition Arousal/Alertness: Awake/alert Behavior During Therapy: WFL for tasks assessed/performed Overall Cognitive Status: Within Functional Limits for tasks assessed                                           General Comments General comments (skin integrity, edema, etc.): daughter present during session and encouraged pt to walk, VSS      Pertinent Vitals/Pain Pain Assessment: Faces Faces Pain Scale: Hurts little more Pain Location: surgical site Pain Descriptors / Indicators: Discomfort;Sore Pain Intervention(s): Monitored during session;Limited activity within patient's tolerance;Premedicated before session;Repositioned           PT Goals (current goals can now be found in the care plan section) Acute Rehab PT Goals Patient Stated Goal: wants to walk more PT Goal Formulation: With patient Time For Goal Achievement: 04/05/18 Potential to Achieve Goals: Good Progress towards PT goals: Progressing toward goals    Frequency    Min 3X/week      PT Plan Current plan remains appropriate       AM-PAC PT "6 Clicks" Daily Activity  Outcome Measure  Difficulty turning over in bed (including adjusting bedclothes, sheets and blankets)?: A Little Difficulty moving from lying on back to sitting on the side of the bed? : A Lot Difficulty sitting down on and standing  up from a chair with arms (e.g., wheelchair, bedside commode, etc,.)?: A Little Help needed moving to and from a bed to chair (including a wheelchair)?: A Little Help needed walking in hospital room?: A Little Help needed climbing 3-5 steps with a railing? : A Little 6 Click Score: 17    End of Session  Equipment Utilized During Treatment: Gait belt Activity Tolerance: Patient tolerated treatment well;Patient limited by fatigue Patient left: with family/visitor present;Other (comment)(on BSC, daughter to assist back to bed) Nurse Communication: Mobility status;Other (comment)(pt on [facility]) PT Visit Diagnosis: Muscle weakness (generalized) (M62.81)     Time: 6808-8110 PT Time Calculation (min) (ACUTE ONLY): 27 min  Charges:  $Gait Training: 23-37 mins                     Quierra Silverio B. Migdalia Dk PT, DPT Acute Rehabilitation Services Pager [phone] Office [phone]    [place] 03/25/2018, 11:24 AM

## 2018-03-25 NOTE — Progress Notes (Addendum)
Patient ID: Janice [name], female   DOB: 23-May-1941, 77 y.o.   MRN: [id number] [facility] Surgery Progress Note:   POD 5  Subjective: Feeling quite a bit better.  Had a good bowel movement today.  Having some burning at incision.    Objective: Vital signs in last 24 hours: Temp:  [97.7 F (36.5 C)-98.7 F (37.1 C)] 97.7 F (36.5 C) (10/14 0450) Pulse Rate:  [62-63] 63 (10/14 0450) Resp:  [17-18] 17 (10/14 0450) BP: (102-119)/(45-57) 116/56 (10/14 0450) SpO2:  [97 %-100 %] 100 % (10/14 0450)  Intake/Output from previous day: 10/13 0701 - 10/14 0700 In: 1928.9 [I.V.:1018.9; NG/GT:610; IV Piggyback:300] Out: 1400 [Urine:1400] Intake/Output this shift: Total I/O In: -  Out: 100 [Urine:100]  Physical Exam:  Quite a bit unsteady on her feet.  Getting back into bed. Alert and oriented.  Mild distress. Abd soft, non distended,  Mild approp tenderness.  No drainage at incision.  No erythema.    Lab Results:  Results for orders placed or performed during the hospital encounter of 03/20/18 (from the past 48 hour(s))  Glucose, capillary     Status: Abnormal   Collection Time: 03/23/18 12:04 PM  Result Value Ref Range   Glucose-Capillary 134 (H) 70 - 99 mg/dL  Glucose, capillary     Status: Abnormal   Collection Time: 03/23/18  3:59 PM  Result Value Ref Range   Glucose-Capillary 137 (H) 70 - 99 mg/dL  Glucose, capillary     Status: Abnormal   Collection Time: 03/23/18  8:40 PM  Result Value Ref Range   Glucose-Capillary 110 (H) 70 - 99 mg/dL  Glucose, capillary     Status: Abnormal   Collection Time: 03/23/18 11:55 PM  Result Value Ref Range   Glucose-Capillary 108 (H) 70 - 99 mg/dL  CBC     Status: Abnormal   Collection Time: 03/24/18  1:29 AM  Result Value Ref Range   WBC 7.7 4.0 - 10.5 K/uL   RBC 3.81 (L) 3.87 - 5.11 MIL/uL   Hemoglobin 11.5 (L) 12.0 - 15.0 g/dL   HCT 36.3 36.0 - 46.0 %   MCV 95.3 80.0 - 100.0 fL   MCH 30.2 26.0 - 34.0 pg   MCHC 31.7 30.0 -  36.0 g/dL   RDW 13.0 11.5 - 15.5 %   Platelets 223 150 - 400 K/uL   nRBC 0.0 0.0 - 0.2 %    Comment: Performed at [facility], [place] [address], [place], [place] [address]  Basic metabolic panel     Status: Abnormal   Collection Time: 03/24/18  1:29 AM  Result Value Ref Range   Sodium 138 135 - 145 mmol/L   Potassium 3.6 3.5 - 5.1 mmol/L   Chloride 103 98 - 111 mmol/L   CO2 27 22 - 32 mmol/L   Glucose, Bld 119 (H) 70 - 99 mg/dL   BUN <5 (L) 8 - 23 mg/dL   Creatinine, Ser 0.47 0.44 - 1.00 mg/dL   Calcium 9.1 8.9 - 10.3 mg/dL   GFR calc non Af Amer >60 >60 mL/min   GFR calc Af Amer >60 >60 mL/min    Comment: (NOTE) The eGFR has been calculated using the CKD EPI equation. This calculation has not been validated in all clinical situations. eGFR's persistently <60 mL/min signify possible Chronic Kidney Disease.    Anion gap 8 5 - 15    Comment: Performed at [facility] [address], [place], [place]  [address]  Glucose, capillary     Status: Abnormal   Collection Time: 03/24/18  4:24 AM  Result Value Ref Range   Glucose-Capillary 122 (H) 70 - 99 mg/dL   Comment 1 QC Due   Glucose, capillary     Status: Abnormal   Collection Time: 03/24/18  8:18 AM  Result Value Ref Range   Glucose-Capillary 114 (H) 70 - 99 mg/dL  Glucose, capillary     Status: Abnormal   Collection Time: 03/24/18 11:56 AM  Result Value Ref Range   Glucose-Capillary 147 (H) 70 - 99 mg/dL  Glucose, capillary     Status: Abnormal   Collection Time: 03/24/18  4:39 PM  Result Value Ref Range   Glucose-Capillary 133 (H) 70 - 99 mg/dL  Glucose, capillary     Status: Abnormal   Collection Time: 03/24/18  8:12 PM  Result Value Ref Range   Glucose-Capillary 112 (H) 70 - 99 mg/dL  Glucose, capillary     Status: Abnormal   Collection Time: 03/25/18 12:38 AM  Result Value Ref Range   Glucose-Capillary 147 (H) 70 - 99 mg/dL  CBC     Status: Abnormal   Collection Time: 03/25/18  1:41 AM  Result Value  Ref Range   WBC 6.9 4.0 - 10.5 K/uL   RBC 3.61 (L) 3.87 - 5.11 MIL/uL   Hemoglobin 11.2 (L) 12.0 - 15.0 g/dL   HCT 34.1 (L) 36.0 - 46.0 %   MCV 94.5 80.0 - 100.0 fL   MCH 31.0 26.0 - 34.0 pg   MCHC 32.8 30.0 - 36.0 g/dL   RDW 12.9 11.5 - 15.5 %   Platelets 241 150 - 400 K/uL   nRBC 0.0 0.0 - 0.2 %    Comment: Performed at [facility], [place]. [address] [place], [place] [address]  Basic metabolic panel     Status: Abnormal   Collection Time: 03/25/18  1:41 AM  Result Value Ref Range   Sodium 137 135 - 145 mmol/L   Potassium 3.8 3.5 - 5.1 mmol/L   Chloride 103 98 - 111 mmol/L   CO2 25 22 - 32 mmol/L   Glucose, Bld 138 (H) 70 - 99 mg/dL   BUN 7 (L) 8 - 23 mg/dL   Creatinine, Ser 0.50 0.44 - 1.00 mg/dL   Calcium 8.6 (L) 8.9 - 10.3 mg/dL   GFR calc non Af Amer >60 >60 mL/min   GFR calc Af Amer >60 >60 mL/min    Comment: (NOTE) The eGFR has been calculated using the CKD EPI equation. This calculation has not been validated in all clinical situations. eGFR's persistently <60 mL/min signify possible Chronic Kidney Disease.    Anion gap 9 5 - 15    Comment: Performed at [facility] [address], [place], [place] [address]  Glucose, capillary     Status: Abnormal   Collection Time: 03/25/18  4:47 AM  Result Value Ref Range   Glucose-Capillary 131 (H) 70 - 99 mg/dL  Glucose, capillary     Status: Abnormal   Collection Time: 03/25/18  8:00 AM  Result Value Ref Range   Glucose-Capillary 111 (H) 70 - 99 mg/dL    Radiology/Results: No results found.  Anti-infectives: Anti-infectives (From admission, onward)   Start     Dose/Rate Route Frequency Ordered Stop   03/20/18 0745  ceFAZolin (ANCEF) IVPB 2g/100 mL premix     2 g 200 mL/hr over 30 Minutes Intravenous On call to O.R. 03/20/18  [id number] 03/20/18 0935   03/20/18 0740  ceFAZolin (ANCEF) 2-4 GM/100ML-% IVPB    Note to Pharmacy:  Barbie Haggis   : cabinet override      03/20/18 0740 03/20/18 0935       Assessment/Plan: Problem List: Patient Active Problem List   Diagnosis Date Noted  . Malnutrition of moderate degree 03/22/2018  . 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 Pancreatic adenocarcinoma ([place]) 02/25/2018  . Headache 12/17/2016  . Confusion 12/17/2016  . Hypothyroidism 12/17/2016  . Acute ischemic stroke ([place]) 12/17/2016  . Ischemic stroke of frontal lobe ([place]) 12/16/2016   Protein calorie malnutrition, moderate. Feeding tube for nutritional support. PT/OT SNF as she lives alone and does not have 24 hour support at home. Anticipate 2-4 weeks. Calorie counts.   D/c tele Soft diet.  4 Days Post-Op    LOS: 5 days     [facility]  [phone]  03/25/2018 9:02 AM

## 2018-03-25 NOTE — Progress Notes (Signed)
Nutrition Follow-up  DOCUMENTATION CODES:   Non-severe (moderate) malnutrition in context of chronic illness  INTERVENTION:   Continue Osmolite 1.2 @ 40 ml/hr via j-tube and increase by 10 ml every 6 hours to goal rate of 60 ml/hr.   Tube feeding regimen provides1728kcal (100% of needs),80grams of protein, and 1126ml of H2O.   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  Ongoing  GOAL:   Patient will meet greater than or equal to 90% of their needs  Progressing  MONITOR:   PO intake, Supplement acceptance, Diet advancement, Labs, Weight trends, Skin, I & O's  REASON FOR ASSESSMENT:   Malnutrition Screening Tool, Consult Enteral/tube feeding initiation and management  ASSESSMENT:   77 yo F referred by Dr. Benson Norway for a new diagnosis of pancreatic cancer 02/2018.  She presented with intermittent lower abdominal pain in August 2019 and thought it was constipation. She saw GI and due to concerns of possible diverticulitis flare, a CT was perfomed.  An incidental pancreatic mass was noted.  This was further worked up wtih MRI, EUS/biopsy, and chest CT.  Biopsies were positive for adenocarcinoma.  Staging was negative for metastatic disease.  She denies jaundice or diabetes.  She has no diarrhea.  She feels reasonably energetic and does what she wants to do with minimal limitation  10/8-s/procedure(s): Diagnostic laparoscopy, liver biopsy, gastrojejunostomy, jejunostomy feeding tube placement 10/11- NGT d/c, advanced to clear liquid diet 10/14- advanced to soft diet  Reviewed I/O's: +529 ml x 24 hours and +1.3 L since admission  Case discussed with RN prior to visit, who  reports pt was just advanced to soft diet this AM. Pt has not received tray of solid, but tolerated applesauce well. RN reports TF was running at 30 ml/hr at start of shift- plan to advance to 50 ml/hr today (advance rate 10 ml/hr every 6 hours).   Spoke with pt and daughter at bedside. Pt appears physically much improved since RD's recollection from last visit. Pt reports "I feel like a new woman". Pt shares that she had some abdominal pain initially with TF, however, this has subsided. She is currently tolerating TF well and was consuming a cup of applesauce without difficulty. Pt is excited to consume some mashed potatoes for lunch.   Osmolite 1.2 infusing via j-tube at 40 ml/hr at time of visit, which provides 1152 kcals, 53 grams protein, and 787 ml free water daily (meeting 70% of estimated kcal needs and 66% of estimated protein needs).   Discussed TF titration plan with pt and daughter. Pt had many concerns about functionality of tube- she expressed that she would like it out ASAP. RD educated pt on need for TF. Also discussed feeding options (ex nocturnal feedings) if pt does transition back home (however, will likely d/c to SNF for short term rehab). Assured pt and daughter that nursing staff would provide education regarding feeding tube as she approaches d/c.   Labs reviewed: CBGS: 111-147.   Diet Order:   Diet Order            DIET SOFT Room service appropriate? Yes; Fluid consistency: Thin  Diet effective now              EDUCATION  NEEDS:   Education needs have been addressed  Skin:  Skin Assessment: Skin Integrity Issues: Skin Integrity Issues:: Incisions Incisions: closed abdomen  Last BM:  03/25/18  Height:   Ht Readings from Last 1 Encounters:  03/20/18 5\' 2"  (1.575 m)    Weight:   Wt Readings from Last 1 Encounters:  03/24/18 60 kg    Ideal Body Weight:  50 kg  BMI:  Body mass index is 24.19 kg/m.  Estimated Nutritional Needs:   Kcal:   1650-1850  Protein:  80-95 grams  Fluid:  >1.6 L    Janice [name] A. Jimmye Norman, RD, LDN, CDE Pager: [phone] After hours Pager: [phone]

## 2018-03-25 NOTE — Progress Notes (Signed)
Patient c/o not having a bowl movement since  Thursday. Educated her on expected process of bowels sl owing down post abdominal surgery and encouraged her to walk around the unit to help get her bowels moving. This nurse just finished morning med pass and offered to walk with patient but she declined d/t drowsiness.

## 2018-03-25 NOTE — Clinical Social Work Note (Signed)
Clinical Social Work Assessment  Patient Details  Name: Janice [name] MRN: [id number] Date of Birth: 1941-04-25  Date of referral:  03/25/18               Reason for consult:  Facility Placement, Discharge Planning                Permission sought to share information with:  Facility Sport and exercise psychologist, Family Supports Permission granted to share information::  Yes, Verbal Permission Granted  Name::     Estill Bakes  Agency::  SNFs  Relationship::  friend  Contact Information:  [phone]  Housing/Transportation Living arrangements for the past 2 months:  [place] of Information:  Patient Patient Interpreter Needed:  None Criminal Activity/Legal Involvement Pertinent to Current Situation/Hospitalization:  No - Comment as needed Significant Relationships:  Warehouse manager, [place], Siblings, Adult Children Lives with:  Self Do you feel safe going back to the place where you live?  Yes Need for family participation in patient care:  Yes (Comment)  Care giving concerns:  Pt lives alone at home- new to [facility]. Pt has good friends and supports but does not feel confident in her ability to manage tube feedings - SNF recommended by surgeon prior to returning home.    Social Worker assessment / plan:  CSW met with pt at bedside, introduced self and role. Pt sister present during assessment. Pt is relatively new to [place], her sources of support are her daughter, her sister, church friends, and her friend Richardson Landry who assists her with care decisions.   CSW explained referral process for SNF, informed pt that her insurance has approved SNF for pt- but that it would need to be continually approved to remain in SNF care given pt's current functional mobility status. CSW discussed having support with picking SNF and encouraged pt to have Richardson Landry, or whoever else helps pt at home, present for education with RN's regarding tube feeds as pt may have to manage those at home at some  point.  Pt states understanding. CSW reviewed SNF packet with pt and sister- and discussed that CSW would visit with additional offers tomorrow. Pt requesting search extended to [place] where Richardson Landry lives and to [facility] which is walking distance to her sister's place.   Employment status:  Retired Nurse, adult PT Recommendations:  [place], Home with [place] / Referral to community resources:  [place]  Patient/Family's Response to care:  Pt amenable to [place] visit, had many questions regarding SNF which CSW answered to the best of my ability. Pt will review choices and visit with pt again tomorrow.   Patient/Family's Understanding of and Emotional Response to Diagnosis, Current Treatment, and Prognosis: Pt understands the best she can her diagnosis and current treatment but I feel has many questions regarding prognosis and care moving forward. Pt quiet and overwhelmed throughout assessment and during both of CSW's visit. As evidenced by her tears and playing with her hair- it appears to be very overwhelming for pt to think about the possibility of doing care such as tube feeds at home. CSW provided encouragement for the pt to learn as much as she could about her care here at hospital and take ownership of her health care as much as possible.   Emotional Assessment Appearance:  Appears stated age Attitude/Demeanor/Rapport:  Engaged Affect (typically observed):  Overwhelmed, Sad Orientation:  Oriented to  Time, Oriented to Place, Oriented to Situation, Oriented to Self  Alcohol / Substance use:  Not Applicable Psych involvement (Current and /or in the community):  No (Comment)  Discharge Needs  Concerns to be addressed:  Care Coordination Readmission within the last 30 days:  Yes Current discharge risk:  Lives alone, Physical Impairment Barriers to Discharge:  Continued Medical Work up   [employer],  [place] 03/25/2018, 5:13 PM

## 2018-03-26 DIAGNOSIS — C787 Secondary malignant neoplasm of liver and intrahepatic bile duct: Principal | ICD-10-CM

## 2018-03-26 DIAGNOSIS — R112 Nausea with vomiting, unspecified: Secondary | ICD-10-CM

## 2018-03-26 DIAGNOSIS — Z808 Family history of malignant neoplasm of other organs or systems: Secondary | ICD-10-CM

## 2018-03-26 DIAGNOSIS — Z79899 Other long term (current) drug therapy: Secondary | ICD-10-CM

## 2018-03-26 DIAGNOSIS — Z8673 Personal history of transient ischemic attack (TIA), and cerebral infarction without residual deficits: Secondary | ICD-10-CM

## 2018-03-26 DIAGNOSIS — Z800 Family history of malignant neoplasm of digestive organs: Secondary | ICD-10-CM

## 2018-03-26 DIAGNOSIS — Z7189 Other specified counseling: Secondary | ICD-10-CM

## 2018-03-26 DIAGNOSIS — Z66 Do not resuscitate: Secondary | ICD-10-CM

## 2018-03-26 DIAGNOSIS — G893 Neoplasm related pain (acute) (chronic): Secondary | ICD-10-CM

## 2018-03-26 DIAGNOSIS — E46 Unspecified protein-calorie malnutrition: Secondary | ICD-10-CM

## 2018-03-26 DIAGNOSIS — C259 Malignant neoplasm of pancreas, unspecified: Secondary | ICD-10-CM

## 2018-03-26 DIAGNOSIS — E440 Moderate protein-calorie malnutrition: Secondary | ICD-10-CM

## 2018-03-26 DIAGNOSIS — F411 Generalized anxiety disorder: Secondary | ICD-10-CM

## 2018-03-26 DIAGNOSIS — I700 Atherosclerosis of aorta: Secondary | ICD-10-CM

## 2018-03-26 DIAGNOSIS — R109 Unspecified abdominal pain: Secondary | ICD-10-CM

## 2018-03-26 DIAGNOSIS — K219 Gastro-esophageal reflux disease without esophagitis: Secondary | ICD-10-CM

## 2018-03-26 DIAGNOSIS — E039 Hypothyroidism, unspecified: Secondary | ICD-10-CM

## 2018-03-26 DIAGNOSIS — Z515 Encounter for palliative care: Secondary | ICD-10-CM

## 2018-03-26 LAB — BASIC METABOLIC PANEL
ANION GAP: 11 (ref 5–15)
BUN: 11 mg/dL (ref 8–23)
CALCIUM: 9.4 mg/dL (ref 8.9–10.3)
CO2: 29 mmol/L (ref 22–32)
Chloride: 98 mmol/L (ref 98–111)
Creatinine, Ser: 0.55 mg/dL (ref 0.44–1.00)
GFR calc non Af Amer: >60 mL/min (ref >60)
GLUCOSE: 137 mg/dL — AB (ref 70–99)
Potassium: 4.5 mmol/L (ref 3.5–5.1)
Sodium: 138 mmol/L (ref 135–145)

## 2018-03-26 LAB — GLUCOSE, CAPILLARY
GLUCOSE-CAPILLARY: 136 mg/dL — AB (ref 70–99)
GLUCOSE-CAPILLARY: 136 mg/dL — AB (ref 70–99)
GLUCOSE-CAPILLARY: 140 mg/dL — AB (ref 70–99)
Glucose-Capillary: 120 mg/dL — ABNORMAL HIGH (ref 70–99)

## 2018-03-26 MED ORDER — PANTOPRAZOLE SODIUM 40 MG PO TBEC: 40.0000 mg | DELAYED_RELEASE_TABLET | Freq: Every day | ORAL | Status: DC

## 2018-03-26 MED ORDER — SIMETHICONE 40 MG/0.6ML PO SUSP
40.0000 mg | Freq: Four times a day (QID) | ORAL | Status: DC | PRN
  Administered 2018-03-26: 40 mg via ORAL
  Filled 2018-03-26 (×3): qty 0.6

## 2018-03-26 MED ORDER — HYDROMORPHONE HCL 1 MG/ML IJ SOLN
1.0000 mg | INTRAMUSCULAR | Status: DC | PRN
  Administered 2018-03-27 – 2018-03-28 (×6): 1 mg via INTRAVENOUS
  Filled 2018-03-26 (×6): qty 1

## 2018-03-26 MED ORDER — PROMETHAZINE HCL 25 MG/ML IJ SOLN
12.5000 mg | INTRAMUSCULAR | Status: DC | PRN
  Administered 2018-03-26 (×3): 12.5 mg via INTRAVENOUS
  Filled 2018-03-26 (×3): qty 1

## 2018-03-26 MED ORDER — METHOCARBAMOL 1000 MG/10ML IJ SOLN
500.0000 mg | Freq: Four times a day (QID) | INTRAVENOUS | Status: DC | PRN
  Administered 2018-03-26 – 2018-03-27 (×3): 500 mg via INTRAVENOUS
  Filled 2018-03-26: qty 500
  Filled 2018-03-26 (×4): qty 5

## 2018-03-26 MED ORDER — METOCLOPRAMIDE HCL 5 MG/ML IJ SOLN
5.0000 mg | Freq: Once | INTRAMUSCULAR | Status: AC
  Administered 2018-03-26: 5 mg via INTRAVENOUS
  Filled 2018-03-26: qty 2

## 2018-03-26 MED ORDER — ACETAMINOPHEN 160 MG/5ML PO SOLN
650.0000 mg | Freq: Three times a day (TID) | ORAL | Status: DC
  Administered 2018-03-26 – 2018-03-28 (×6): 650 mg
  Filled 2018-03-26 (×6): qty 20.3

## 2018-03-26 MED ORDER — FAMOTIDINE IN NACL 20-0.9 MG/50ML-% IV SOLN
20.0000 mg | INTRAVENOUS | Status: DC
  Administered 2018-03-26 – 2018-03-28 (×3): 20 mg via INTRAVENOUS
  Filled 2018-03-26 (×3): qty 50

## 2018-03-26 MED ORDER — HYDROMORPHONE HCL 1 MG/ML IJ SOLN
1.0000 mg | INTRAMUSCULAR | Status: DC | PRN
  Administered 2018-03-26 (×4): 1 mg via INTRAVENOUS
  Filled 2018-03-26 (×4): qty 1

## 2018-03-26 MED ORDER — LORAZEPAM 2 MG/ML IJ SOLN
0.5000 mg | Freq: Three times a day (TID) | INTRAMUSCULAR | Status: DC
  Administered 2018-03-26 – 2018-03-28 (×6): 0.5 mg via INTRAVENOUS
  Filled 2018-03-26 (×7): qty 1

## 2018-03-26 NOTE — Progress Notes (Signed)
PT Cancellation Note  Patient Details Name: Janice [name] MRN: [id number] DOB: 06-04-1941   Cancelled Treatment:    Reason Eval/Treat Not Completed: (P) Other (comment) Pt awaiting Palliative consult.  Danyelle Brookover B. Migdalia Dk PT, DPT Acute Rehabilitation Services Pager [phone] Office [phone]    [place] 03/26/2018, 3:26 PM

## 2018-03-26 NOTE — Progress Notes (Signed)
Patient continues to vomit. Refusing NG tube. Informed patient we will continue with nausea medications.

## 2018-03-26 NOTE — Progress Notes (Deleted)
[facility] OFF[facility] PROGRESS NOTE  Patient Care Team: Corrington, Delsa Grana, MD as PCP - General (Family Medicine)  HEME/ONC OVERVIEW: 1. Metastatic pancreatic adenocarcinoma, bx-liver mets - Staging scans negative for mets; adenocarcinoma on intra-op liver biopsy in 03/2018  2. Protein malnutrition - J-tube placed in 03/2018 by Dr. Barry Dienes   ASSESSMENT & PLAN:  ***  No orders of the defined types were placed in this encounter.   All questions were answered. The patient knows to call the clinic with any problems, questions or concerns. No barriers to learning was detected.  A total of more than {CHL ONC TIME VISIT - [id number]} were spent face-to-face with the patient during this encounter and over half of that time was spent on counseling and coordination of care as outlined above.   Tish Men, MD 03/26/2018 8:12 AM  CHIEF COMPLAINT: "I am here for *** "  INTERVAL HISTORY:   SUMMARY OF ONCOLOGIC HISTORY:   Pancreatic adenocarcinoma ([place])   01/10/2018 Miscellaneous    Intermittent periumbilical abdominal pain and constipation for 1 month; evaluated by gastroenterologist and rec'ed CT to rule out diverticulitis    02/14/2018 Initial Diagnosis    CT abdomen/pelvis w/ contrast: 2.3 x 2.0 x 1.0cm ill-defined mass arising from the uncinate process of the pancreas.  No involvement of the main SMA or SMV.  The mass does not make contact with multiple jejunal branches arising from the SMA.  No biliary obstruction.  Geographic hypoattenuation in the left hemi-liver on either side of the fissure for calcium formal ligament has imaging appearance most suggestive of benign focal fatty liver infiltration.  Similarly, there are multiple tiny scattered pulmonary nodules, which likely represent old granulomatous disease.  Surgical changes of prior sigmoid colectomy and colonic anastomosis without evidence of complications.    02/16/2018 Imaging    MRCP abdomen:  Lack of intravenous  contrast enhancement noted likely due to contrast extravasation, although none was apparent to the technologist or patient at the time of the exam).  2.0 cm mass in the uncinate process of the pancreas, highly suspicious for pancreatic carcinoma. Consider endoscopic ultrasound with FNA for tissue diagnosis.  No evidence of biliary ductal dilatation or abdominal metastatic disease.    02/19/2018 Procedure    EUS: A round mass was identified in uncinate process of the pancreas, measuring 2 x 2 cm.  FNA of the mass was performed.    02/19/2018 Pathology Results    Pathology (Accession: [id number]): Adenocarcinoma.     02/25/2018 Cancer Staging    Staging form: Exocrine Pancreas, AJCC 8th Edition - Clinical: Stage IB (cT2, cN0, cM0) - Signed by Tish Men, MD on 02/25/2018     REVIEW OF SYSTEMS:   Constitutional: ( - ) fevers, ( - )  chills , ( - ) night sweats Eyes: ( - ) blurriness of vision, ( - ) double vision, ( - ) watery eyes Ears, nose, mouth, throat, and face: ( - ) mucositis, ( - ) sore throat Respiratory: ( - ) cough, ( - ) dyspnea, ( - ) wheezes Cardiovascular: ( - ) palpitation, ( - ) chest discomfort, ( - ) lower extremity swelling Gastrointestinal:  ( - ) nausea, ( - ) heartburn, ( - ) change in bowel habits Skin: ( - ) abnormal skin rashes Lymphatics: ( - ) new lymphadenopathy, ( - ) easy bruising Neurological: ( - ) numbness, ( - ) tingling, ( - ) new weaknesses Behavioral/Psych: ( - ) mood change, ( - )  new changes  All other systems were reviewed with the patient and are negative.  I have reviewed the past medical history, past surgical history, social history and family history with the patient and they are unchanged from previous note.  ALLERGIES:  is allergic to tape.  MEDICATIONS:  No current facility-administered medications for this visit.    No current outpatient medications on file.   Facility-Administered Medications Ordered in Other Visits  Medication  Dose Route Frequency Provider Last Rate Last Dose  . acetaminophen (TYLENOL) solution 325-650 mg  325-650 mg Oral Q6H PRN Johnathan Hausen, MD   650 mg at 03/25/18 1723  . alum & mag hydroxide-simeth (MAALOX/MYLANTA) 200-200-20 MG/5ML suspension 30 mL  30 mL Oral Q4H PRN [name], MD   30 mL at 03/26/18 0122  . dextrose 5 % and 0.45 % NaCl with KCl 20 mEq/L infusion   Intravenous Continuous [name], MD 50 mL/hr at 03/26/18 0803    . diphenhydrAMINE (BENADRYL) 12.5 MG/5ML elixir 12.5 mg  12.5 mg Oral Q6H PRN [name], MD       Or  . diphenhydrAMINE (BENADRYL) injection 12.5 mg  12.5 mg Intravenous Q6H PRN [name], MD   12.5 mg at 03/24/18 0156  . famotidine (PEPCID) IVPB 20 mg premix  20 mg Intravenous Q24H [name], MD      . feeding supplement (OSMOLITE 1.2 CAL) liquid 1,000 mL  1,000 mL Per Tube Continuous [name], MD   Stopped at 03/26/18 0023  . HYDROmorphone (DILAUDID) injection 1 mg  1 mg Intravenous Q3H PRN Erroll Luna, MD   1 mg at 03/26/18 0741  . ibuprofen (ADVIL,MOTRIN) 100 MG/5ML suspension 400 mg  400 mg Oral Q8H PRN Georganna Skeans, MD   400 mg at 03/25/18 2004  . lactated ringers infusion   Intravenous Continuous [name], MD   Stopped at 03/20/18 1157  . menthol-cetylpyridinium (CEPACOL) lozenge 3 mg  1 lozenge Oral PRN [name], MD      . methocarbamol (ROBAXIN) 500 mg in dextrose 5 % 50 mL IVPB  500 mg Intravenous Q6H PRN [name], MD      . ondansetron (ZOFRAN-ODT) disintegrating tablet 4 mg  4 mg Oral Q6H PRN [name], MD       Or  . ondansetron (ZOFRAN) injection 4 mg  4 mg Intravenous Q6H PRN [name], MD   4 mg at 03/26/18 0609  . oxyCODONE (ROXICODONE) 5 MG/5ML solution 5 mg  5 mg Oral Q4H PRN Johnathan Hausen, MD   5 mg at 03/25/18 1349  . phenol (CHLORASEPTIC) mouth spray 1 spray  1 spray Mouth/Throat PRN [name], MD      . promethazine (PHENERGAN) injection 12.5 mg  12.5 mg Intravenous Q4H PRN Cornett,  Marcello Moores, MD   12.5 mg at 03/26/18 0752  . simethicone (MYLICON) 40 ZD/6.6YQ suspension 40 mg  40 mg Oral Q6H PRN [name], MD      . sodium chloride (OCEAN) 0.65 % nasal spray 1 spray  1 spray Each Nare PRN [name], MD      . thyroid (ARMOUR) tablet 30 mg  30 mg Oral QAC breakfast [name], MD   30 mg at 03/25/18 0815    PHYSICAL EXAMINATION: ECOG PERFORMANCE STATUS: {CHL ONC ECOG [phone]}  There were no vitals filed for this visit. There were no vitals filed for this visit.  GENERAL: alert, no distress and comfortable SKIN: skin color, texture, turgor are normal, no rashes or significant  lesions EYES: conjunctiva are pink and non-injected, sclera clear OROPHARYNX: no exudate, no erythema; lips, buccal mucosa, and tongue normal  NECK: supple, non-tender LYMPH:  no palpable lymphadenopathy in the cervical or axillary  LUNGS: clear to auscultation and percussion with normal breathing effort HEART: regular rate & rhythm and no murmurs and no lower extremity edema ABDOMEN: soft, non-tender, non-distended, normal bowel sounds Musculoskeletal: no cyanosis of digits and no clubbing  PSYCH: alert & oriented x 3, fluent speech NEURO: no focal motor/sensory deficits  LABORATORY DATA:  I have reviewed the data as listed    Component Value Date/Time   NA 138 03/26/2018 0658   K 4.5 03/26/2018 0658   CL 98 03/26/2018 0658   CO2 29 03/26/2018 0658   GLUCOSE 137 (H) 03/26/2018 0658   BUN 11 03/26/2018 0658   CREATININE 0.55 03/26/2018 0658   CREATININE 0.70 02/26/2018 0849   CALCIUM 9.4 03/26/2018 0658   PROT 5.3 (L) 03/21/2018 0441   ALBUMIN 2.7 (L) 03/21/2018 0441   AST 42 (H) 03/21/2018 0441   AST 22 02/26/2018 0849   ALT 24 03/21/2018 0441   ALT 15 02/26/2018 0849   ALKPHOS 42 03/21/2018 0441   BILITOT 0.7 03/21/2018 0441   BILITOT 0.4 02/26/2018 0849   GFRNONAA >60 03/26/2018 0658   GFRNONAA >60 02/26/2018 0849   GFRAA >60 03/26/2018 0658   GFRAA >60  02/26/2018 0849    No results found for: SPEP, UPEP  Lab Results  Component Value Date   WBC 6.9 03/25/2018   NEUTROABS 3.2 03/14/2018   HGB 11.2 (L) 03/25/2018   HCT 34.1 (L) 03/25/2018   MCV 94.5 03/25/2018   PLT 241 03/25/2018      Chemistry      Component Value Date/Time   NA 138 03/26/2018 0658   K 4.5 03/26/2018 0658   CL 98 03/26/2018 0658   CO2 29 03/26/2018 0658   BUN 11 03/26/2018 0658   CREATININE 0.55 03/26/2018 0658   CREATININE 0.70 02/26/2018 0849      Component Value Date/Time   CALCIUM 9.4 03/26/2018 0658   ALKPHOS 42 03/21/2018 0441   AST 42 (H) 03/21/2018 0441   AST 22 02/26/2018 0849   ALT 24 03/21/2018 0441   ALT 15 02/26/2018 0849   BILITOT 0.7 03/21/2018 0441   BILITOT 0.4 02/26/2018 0849       RADIOGRAPHIC STUDIES: I have personally reviewed the radiological images as listed below and agreed with the findings in the report. Ct Chest W Contrast  Result Date: 02/28/2018 CLINICAL DATA:  Pancreatic adenocarcinoma.  Evaluate for metastasis. EXAM: CT CHEST WITH CONTRAST TECHNIQUE: Multidetector CT imaging of the chest was performed during intravenous contrast administration. CONTRAST:  39mL ISOVUE-300 IOPAMIDOL (ISOVUE-300) INJECTION 61% COMPARISON:  02/16/2018 abdominal MRI. 02/14/2018 abdominal CT. Chest radiograph 12/16/2016. FINDINGS: Cardiovascular: Aortic and branch vessel atherosclerosis. Tortuous thoracic aorta. Normal heart size, without pericardial effusion. No central pulmonary embolism, on this non-dedicated study. Mediastinum/Nodes: No supraclavicular adenopathy. No mediastinal or hilar adenopathy. Lungs/Pleura: No pleural fluid. Basilar and subpleural predominant pulmonary nodules measure on the order of 5 mm and less, identified on series 3. Mild mucoid impaction suspected within the anteromedial right middle lobe including on image 82/3. Upper Abdomen: Atrophic lateral segment left liver lobe, nonspecific. Normal imaged portions of the  spleen, stomach, adrenal glands, kidneys. Pancreatic uncinate process lesion not imaged. Abdominal aortic atherosclerosis. Musculoskeletal: Mild thoracic spondylosis. Mild convex right thoracic spine curvature. IMPRESSION: 1. Basilar and subpleural predominant pulmonary nodules are  indeterminate. Benign etiology (most likely subpleural lymph nodes) favored. Recommend attention on follow-up. 2. No thoracic adenopathy. 3.  Aortic Atherosclerosis (ICD10-I70.0). Electronically Signed   By: Abigail Miyamoto M.D.   On: 02/28/2018 15:10

## 2018-03-26 NOTE — Progress Notes (Signed)
Patient is requesting to see palliative team. Paged to notify them HPOA is at bedside currently.

## 2018-03-26 NOTE — Progress Notes (Addendum)
0000 patient having increased cramping and nausea since dinner, small amount dark green emesis. Paged Dr Brantley Stage, order to hold tube feed for 6 hours and restart at 40 mL/h.  Patient asked if she could be given enough pain medication that she stopped breathing.  Explained medical advanced directives, DNR, and palliative care.  Patient wishes to stop tube feedings and be given medication where she is out of pain.  Advised her to talk to physicians in the am and they can answer questions about care and wishes. If patient wants to pursue comfort care, the doctor can order a palliative consult.  Patient agreed and will try to rest tonight and discuss in the am.   0242 patient still having nausea and vomiting. Paged Dr Brantley Stage for phenergan, new orders given.  Patient in room dictating directions for financial affairs to friend.   [facility] has arrived per pt request. Asked for update on condition, discussed current wishes of patient.  Still in pain, asking for medication that is IV d/t nausea. Refuses any medications by mouth. Doesn't want Morphine d/t prev experience, caused hallucinations.  Paged Dr Brantley Stage, new orders placed.

## 2018-03-26 NOTE — Plan of Care (Signed)
  Problem: Education: Goal: Knowledge of General Education information will improve Description Including pain rating scale, medication(s)/side effects and non-pharmacologic comfort measures Outcome: Progressing   Problem: Clinical Measurements: Goal: Cardiovascular complication will be avoided Outcome: Progressing   Problem: Elimination: Goal: Will not experience complications related to bowel motility Outcome: Progressing Goal: Will not experience complications related to urinary retention Outcome: Progressing

## 2018-03-26 NOTE — Social Work (Addendum)
CSW aware of potential change in disposition. Await palliative input for further clarification of goals for treatment and care moving forward.  12:02pm- CSW spoke with pt, and pt two guests present (including HCPOA). Pt acknowledged CSW presence but did not engage in conversation except to state that she did not feel good.  Pt friend and HCPOA had several questions regarding coverage of SNF placement under insurance (pt HCPOA had a book stating pt had $0 copayments from day 1-100). HCPOA acknowledged that pt would be having further conversations with palliative care and Dr. Barry Dienes regarding disposition. We discussed that CSW would await the clarity regarding care decisions for pt before returning to discussion about SNF.   Continue to follow.   Westley Hummer, MSW, [place] [employer] [phone]

## 2018-03-26 NOTE — Consult Note (Addendum)
Consultation Note Date: 03/26/2018   Patient Name: Janice [name]  DOB: 12-18-1940  MRN: [id number]  Age / Sex: 77 y.o., female  PCP: Corrington, Delsa Grana, MD Referring Physician: [name], MD  Reason for Consultation: Establishing goals of care, Non pain symptom management and Pain control  HPI/Patient Profile: 77 y.o. female  with past medical history of CVA, hypothyroidism, pancreatic cancer, GERD admitted on 03/20/2018 for pancreatic cancer. Patient presented with intermittent lower abdominal pain in August 2019. CT performed revealed pancreatic mass. Further workup with MRI, EUS/biopsy, and chest CT. Biopsies positive for adenocarcinoma. Staging was negative for metastatic disease. Whipple procedure scheduled. 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 Whipple was not performed. Liver biopsy positive for adenocarcinoma. Postop course has been challenging with continued nausea and intractable pain. Palliative medicine consultation for symptom management.   Clinical Assessment and Goals of Care:  I have reviewed medical records, discussed with care team, and met with patient and her friend Richardson Landry) who is documented HCPOA to discuss diagnosis, prognosis, GOC, EOL wishes, disposition and options. Initially spoke with Richardson Landry in conference room. Shortly after spoke with patient when she woke up.   I introduced Palliative Medicine as specialized medical care for people living with serious illness. It focuses on providing relief from the symptoms and stress of a serious illness. The goal is to improve quality of life for both the patient and the family.  We discussed a brief life review of the patient. Richardson Landry shares that [employer]asta" retired from [employer] and moved down to [place] two years ago to be closer to her daughter in [place]. Other daughter  lives in [place]. Prior to admission, patient living home alone and very independent. Newly diagnosed with pancreatic cancer in September. Plan was for outpatient oncology through [facility] (Dr. Georgiann Cocker). She was already scheduled for possible whipple with Dr. Barry Dienes.   Discussed course of hospitalization including diagnoses and interventions. Richardson Landry has a good understanding of prognosis, especially in the short term if she cannot maintain nutritional status.   I attempted to elicit values and goals of care important to the patient during my conversation with Richardson Landry. Richardson Landry speaks of her wishes for "quality of life over quantity." He shares that she had an extremely rough night last night with nausea and pain. He also shares that she had friend at bedside documenting her wishes in order to get all affairs in order.   Advanced directives, concepts specific to code status, artifical feeding and hydration were discussed. Reviewed the patients living will with Richardson Landry. Patient has spoke clearly of her wishes against life-prolonging measures if terminal/incurable. She has documentation for desire for natural death. Richardson Landry wishes for me to further discuss code status with her when she is awake.   Discussed symptom management regimen with Richardson Landry. Per Richardson Landry, she has found greatest relief from prn dilaudid, phenergin, and robaxin. He shares that she has a low pain tolerance.   Discussed possible plan for SNF for rehab if symptoms can  be controlled.   PMT contact information provided and reassured Richardson Landry of continued support from palliative during hospitalization.     **Shortly after, follow-up with patient at bedside who is awake and alert but very uncomfortable in the bed. She complains of continuous abdominal pain and nausea. She recently received Reglan. She refuses for NGT to be placed for symptom management.   Introduced palliative medicine to [place]. She is very short during our conversation. She tells me she  wishes to be comfortable, close her eyes, and go to the [facility]. She is begging for more pain medication. Instructed RN to give prn dilaudid.   Attempted to discuss course of hospital diagnoses and interventions. She has a good understanding of metastatic cancer diagnosis after discussions with Dr. Barry Dienes. Discussed code status. Patient immediately shakes her head "no" when asked about wanting resuscitation/life support measures. Discussed DNR status with her and Richardson Landry. Richardson Landry respects any decisions [name] makes for herself.   Discussed symptom management medication adjustments with patient (drowsy by end of my conversation) and with Richardson Landry. Richardson Landry agrees with this plan. Richardson Landry is hopeful we can have a better conversation with [name] tomorrow regarding her goals/wishes for plan moving forward.    SUMMARY OF RECOMMENDATIONS    GOC with patient and friend Richardson Landry) who is documented HCPOA.   Patient has spoken clearly of her wishes against heroic measures at EOL. Patient immediately shakes head "no" when asked about resuscitation/life support measures. Code status changed to DNR. Richardson Landry respects her decisions and agrees with DNR code status.  Symptom management--see below.  Continue current plan of care. Watchful waiting and plan per surgery. If patient's symptom burden continues to worsen and nutritional status remains poor, would be appropriate for shift to comfort measures and hospice placement.  Discussed with Dr. Barry Dienes. PMT will follow.   Code Status/Advance Care Planning:  DNR  Symptom Management:   Tylenol 650 per tube TID scheduled  Roxicodone '5mg'$  PO q4h prn moderate pain  Dilaudid '1mg'$  IV q2h prn severe pain  Ativan 0.'5mg'$  IV TID scheduled for nausea/anxiety  Continue prn zofran and phenergan for nausea  Continue robaxin prn muscle spasms.   Palliative Prophylaxis:   Aspiration, Delirium Protocol, Frequent Pain Assessment, Oral Care and Turn Reposition  Psycho-social/Spiritual:    Desire for further Chaplaincy support:yes  Additional Recommendations: Caregiving  Support/Resources, Compassionate Wean Education and Education on Hospice  Prognosis:   Poor prognosis with metastatic pancreatic cancer, poor nutritional status, ongoing pain/nausea.  Discharge Planning: To Be Determined      Primary Diagnoses: Present on Admission: . 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 The following aspects are pertinent.  Past Medical History:  Diagnosis Date  . At high risk for falls    IN LAST 20 YRS SHE SEEMS TO HAVE FALLEN MORE  . Cancer ([place])   . GERD (gastroesophageal reflux disease)    ONLY SINCE SHE HAS STARTED TAKING ENZYMES..  . Head injury   . Headache    NOT RECENTLY.  Marland Kitchen Hypothyroidism   . Stroke ([place])    2018    . Thyroid disease   . Trigger finger of right hand    INDEX FINGER   Social History   Socioeconomic History  . Marital status: Divorced    Spouse name: Not on file  . Number of children: Not on file  . Years of education: Not on file  .  Highest education level: Not on file  Occupational History  . Not on file  Social Needs  . Financial resource strain: Not on file  . Food insecurity:    Worry: Not on file    Inability: Not on file  . Transportation needs:    Medical: Not on file    Non-medical: Not on file  Tobacco Use  . Smoking status: Never Smoker  . Smokeless tobacco: Never Used  Substance and Sexual Activity  . Alcohol use: Not Currently  . Drug use: Not Currently  . Sexual activity: Not on file  Lifestyle  . Physical activity:    Days per week: Not on file    Minutes per session: Not on file  . Stress: Not on file  Relationships  . Social connections:    Talks on phone: Not on file    Gets together: Not on file    Attends religious service: Not on file    Active member of club  or organization: Not on file    Attends meetings of clubs or organizations: Not on file    Relationship status: Not on file  Other Topics Concern  . Not on file  Social History Narrative  . Not on file   Family History  Problem Relation Age of Onset  . Liver disease Mother        d.46  . Bone cancer Father        cancer in his back  . Colon cancer Daughter 89   Scheduled Meds: . thyroid  30 mg Oral QAC breakfast   Continuous Infusions: . dextrose 5 % and 0.45 % NaCl with KCl 20 mEq/L 50 mL/hr at 03/26/18 1206  . famotidine (PEPCID) IV 20 mg (03/26/18 0858)  . feeding supplement (OSMOLITE 1.2 CAL) Stopped (03/26/18 0023)  . lactated ringers Stopped (03/20/18 1157)  . methocarbamol (ROBAXIN) IV 500 mg (03/26/18 1110)   PRN Meds:.acetaminophen (TYLENOL) oral liquid 160 mg/5 mL, alum & mag hydroxide-simeth, diphenhydrAMINE **OR** diphenhydrAMINE, HYDROmorphone (DILAUDID) injection, ibuprofen, menthol-cetylpyridinium, methocarbamol (ROBAXIN) IV, ondansetron **OR** ondansetron (ZOFRAN) IV, oxyCODONE, phenol, promethazine, simethicone, sodium chloride Medications Prior to Admission:  Prior to Admission medications   Medication Sig Start Date End Date Taking? Authorizing Provider  acetaminophen (TYLENOL) 500 MG tablet Take 500 mg by mouth every 12 (twelve) hours.    Yes [provider]  ARMOUR THYROID 30 MG tablet Take 30 mg by mouth daily before breakfast. 11/04/16  Yes [provider]  aspirin EC 81 MG EC tablet Take 1 tablet (81 mg total) by mouth daily. 12/20/16  Yes Velvet Bathe, MD  estradiol (ESTRACE) 0.5 MG tablet Take 0.5 mg by mouth daily. 12/14/16  Yes [provider]  polyethylene glycol (MIRALAX / GLYCOLAX) packet Take 17 g by mouth daily.   Yes [provider]   Allergies  Allergen Reactions  . Tape Rash and Other (See Comments)    SKIN DEVELOPS WELTS WHERE TAPE HAS BEEN APPLIED/COBAN WRAP IS TOLERATED, so PLEASE use!!   Review of  Systems  Constitutional: Positive for activity change and appetite change.  Gastrointestinal: Positive for abdominal pain, nausea and vomiting.  Neurological: Positive for weakness.   Physical Exam  Constitutional: She is oriented to person, place, and time. She is easily aroused. She appears ill.  HENT:  Head: Normocephalic and atraumatic.  Pulmonary/Chest: No accessory muscle usage. No tachypnea. No respiratory distress.  Abdominal: She exhibits distension. There is tenderness.  Neurological: She is alert, oriented to person, place, and  time and easily aroused.  Skin: Skin is warm and dry. There is pallor.  Psychiatric: Her mood appears anxious.  Nursing note and vitals reviewed.  Vital Signs: BP (!) 113/59 (BP Location: Right Arm)   Pulse 75   Temp 98.4 F (36.9 C) (Oral)   Resp 16   Ht 5' 2" (1.575 m)   Wt 60 kg   SpO2 97%   BMI 24.19 kg/m  Pain Scale: 0-10 POSS *See Group Information*: 1-Acceptable,Awake and alert Pain Score: Asleep   SpO2: SpO2: 97 % O2 Device:SpO2: 97 % O2 Flow Rate: .O2 Flow Rate (L/min): 0 L/min  IO: Intake/output summary:   Intake/Output Summary (Last 24 hours) at 03/26/2018 1602 Last data filed at 03/26/2018 1500 Gross per 24 hour  Intake 2030.47 ml  Output 300 ml  Net 1730.47 ml    LBM: Last BM Date: 03/25/18 Baseline Weight: Weight: 55.2 kg Most recent weight: Weight: 60 kg     Palliative Assessment/Data: PPS 20%   Flowsheet Rows     Most Recent Value  Intake Tab  Referral Department  Surgery  Unit at Time of Referral  Med/Surg Unit  Palliative Care Primary Diagnosis  Cancer  Palliative Care Type  New Palliative care  Reason for referral  Clarify Goals of Care  Date first seen by Palliative Care  03/26/18  Clinical Assessment  Palliative Performance Scale Score  20%  Psychosocial & Spiritual Assessment  Palliative Care Outcomes  Patient/Family meeting held?  Yes  Who was at the meeting?  patient, friend/HCPOA  Palliative  Care Outcomes  Clarified goals of care, ACP counseling assistance, Improved pain interventions, Improved non-pain symptom therapy, Provided end of life care assistance, Changed CPR status, Changed to focus on comfort, Provided psychosocial or spiritual support, Counseled regarding hospice      Time In/Out: 1300-1400, 1545-1630 Time Total: 166mn Greater than 50%  of this time was spent counseling and coordinating care related to the above assessment and plan.  Signed by:  MIhor Dow FNP-C Palliative Medicine Team  Phone: [phone]: [phone]  Please contact Palliative Medicine Team phone at [phone] questions and concerns.  For individual provider: See AShea Evans

## 2018-03-26 NOTE — Consult Note (Signed)
[facility] CONSULT NOTE  Janice [name] Care Team: Janice Janice [name], Janice Grana, MD as PCP - General (Family Medicine)  HEME/ONC OVERVIEW: 1. Metastatic pancreatic adenocarcinoma (bx-proven liver mets) - Staging scans negative for mets; adenocarcinoma on intra-op liver biopsy in 03/2018  2. Protein malnutrition - J-tube placed in 03/2018 by Dr. Barry [name]   ASSESSMENT & PLAN:  Metastatic pancreatic adenocarcinoma -I reviewed Janice Janice [name]'s hospital course, including Janice biopsy-proven liver metastasis -Postop, Janice [name] has had a challenging recovery course so far, complicated by difficulty with tube feeding, nausea and intractable pain. -I discussed with Janice Janice [name] regarding Janice incurable nature of Janice disease, and that Janice goal of treatment would be palliative -However, given Janice Janice [name]'s clinical decline declining, any palliative chemotherapy would likely cause more harms than benefits, unless Janice Janice [name] makes significant improvement  -I spoke with Janice Janice [name] [phone]), who is Janice Janice [name]'s POA, regarding Janice diagnosis, potential treatment options and prognosis; briefly, I informed him that any discussion regarding chemotherapy would be premature currently while Janice Janice [name] is going through significant distress, and hopefully once her symptoms improve and that she is able to have more mental clarity, we can revisit Janice topic of palliative treatment   Abdominal pain, nausea/emesis -Possibly due to ileus and tube feeding -I agree with aggressive pain control and appreciate Janice surgical team's care for our mutual Janice [name]   Goals of Care -As discussed above, I informed Janice Janice [name] that Janice cancer was not curable, and Janice treatment was for palliative intent only -Janice [name] was made DNR this morning -Even prior to surgery, Janice Janice [name] had mentioned hospice as a possible option but ultimately proceeded with surgery based on Janice diagnosis of Stage I pancreatic cancer at that time -Today, Janice  Janice [name] stated that she just wanted to be comfortable and was not interested in pursuing treatment, but Janice discussion was limited due to her drowsiness from Janice pain medications and periodic worsening distress from abdominal pain and emesis -Janice Janice [name]'s POA, Janice Janice [name], and I spoke over Janice phone at length and agreed to defer Janice discussion on any treatment until Janice Janice [name]'s current symptoms are under control  A total of more than 60 minutes were spent face-to-face with Janice Janice [name] during this encounter and over half of that time was spent on counseling and coordination of care as outlined above.    Thank you for caring for our mutual Janice [name]. Please do not hesitate to contact me if there any questions.   Janice Men, MD 03/26/2018 12:38 PM   CHIEF COMPLAINTS/PURPOSE OF CONSULTATION:  "I am doing horribly"  HISTORY OF PRESENTING ILLNESS:  Janice Janice [name] is  77 y.o. female w/ no significant past medical history who was admitted for Whipple procedure for stage I pancreatic adenocarcinoma, but was subsequently found with liver metastases intra-op.  For detailed history regarding her pancreatic adenocarcinoma, see oncologic history below.  Briefly, Janice [name] presented to emergency department for intermittent abdominal pain in September 2019, and CT showed a 2.3 x 2.0 x 1.0cm pancreatic uncinate mass, for which she underwent biopsy that showed adenocarcinoma.  Staging scans, including MRCP abdomen and CT chest, were negative for metastatic disease.  She was evaluated by Dr. Barry [name] and deemed appropriate for surgical resection, but unfortunately intra-op she was found with suspicious liver lesion that was biopsy-proven adenocarcinoma.  Janice Whipple procedure was aborted, and she underwent J-tube placement for malnutrition during Janice surgery.  Postop, Janice [name] initially did relatively well, but since her diet was changed to soft diet and Janice tube  feed rate increased on 03/25/2018, Janice [name] has had significant  abdominal pain, nausea and bilious emesis.  She was intermittently falling asleep during our conversation, and no family member was present at bedside.  She reports intermittent, generalized abdominal pain, worse near Janice J-tube site.  During our conversation, she had several episodes of small-volume, bilious emesis without hematemesis.  I have reviewed her chart and materials related to her cancer extensively and collaborated history with Janice Janice [name]. Summary of oncologic history is as follows:   Pancreatic adenocarcinoma ([place])   01/10/2018 Miscellaneous    Intermittent periumbilical abdominal pain and constipation for 1 month; evaluated by gastroenterologist and rec'ed CT to rule out diverticulitis    02/14/2018 Initial Diagnosis    CT abdomen/pelvis w/ contrast: 2.3 x 2.0 x 1.0cm ill-defined mass arising from Janice uncinate process of Janice pancreas.  No involvement of Janice main SMA or SMV.  Janice mass does not make contact with multiple jejunal branches arising from Janice SMA.  No biliary obstruction.  Geographic hypoattenuation in Janice left hemi-liver on either side of Janice fissure for calcium formal ligament has imaging appearance most suggestive of benign focal fatty liver infiltration.  Similarly, there are multiple tiny scattered pulmonary nodules, which likely represent old granulomatous disease.  Surgical changes of prior sigmoid colectomy and colonic anastomosis without evidence of complications.    02/16/2018 Imaging    MRCP abdomen:  Lack of intravenous contrast enhancement noted likely due to contrast extravasation, although none was apparent to Janice technologist or Janice [name] at Janice time of Janice exam).  2.0 cm mass in Janice uncinate process of Janice pancreas, highly suspicious for pancreatic carcinoma. Consider endoscopic ultrasound with FNA for tissue diagnosis.  No evidence of biliary ductal dilatation or abdominal metastatic disease.    02/19/2018 Procedure    EUS: A round mass was identified in  uncinate process of Janice pancreas, measuring 2 x 2 cm.  FNA of Janice mass was performed.    02/19/2018 Pathology Results    Pathology (Accession: [id number]): Adenocarcinoma.     02/28/2018 Imaging    CT chest w/ contrast: 1. Basilar and subpleural predominant pulmonary nodules are indeterminate. Benign etiology (most likely subpleural lymph nodes) favored. Recommend attention on follow-up. 2. No thoracic adenopathy. 3.  Aortic Atherosclerosis (ICD10-I70.0).    03/20/2018 Surgery    Diagnostic laparoscopy, liver biopsy, gastrojejunostomy, jejunostomy feeding tube placement.   - A suspicious lesion was seen on Janice left side of Janice liver intra-op, biopsied    03/20/2018 Pathology Results    Liver biopsy:  (Accession: [id number]) Pancreatic adenocarcinoma    03/20/2018 Cancer Staging    Staging form: Exocrine Pancreas, AJCC 8th Edition - Clinical stage from 03/20/2018: Stage IV (cT2, cN0, pM1) - Signed by Janice Men, MD on 03/26/2018     MEDICAL HISTORY:  Past Medical History:  Diagnosis Date  . At high risk for falls    IN LAST 20 YRS SHE SEEMS TO HAVE FALLEN MORE  . Cancer ([place])   . GERD (gastroesophageal reflux disease)    ONLY SINCE SHE HAS STARTED TAKING ENZYMES..  . Head injury   . Headache    NOT RECENTLY.  [place] Hypothyroidism   . Stroke ([place])    2018    . Thyroid disease   . Trigger finger of right hand    INDEX FINGER    SURGICAL HISTORY: Past Surgical History:  Procedure Laterality Date  . ABDOMINAL HYSTERECTOMY    . APPENDECTOMY    . BRAIN SURGERY  SCALP  . COLON SURGERY     2016    . DIAGNOSTIC LAPAROSCOPY  03/20/2018   w/lysis of adhesions  . DIVERTICULITIS    . FINE NEEDLE ASPIRATION N/A 02/19/2018   Procedure: FINE NEEDLE ASPIRATION (FNA) LINEAR;  Surgeon: Carol Ada, MD;  Location: [facility];  Service: Endoscopy;  Laterality: N/A;  . FRACTURE SURGERY     LEFT ARM BROKEN  YRS AGO  . GASTROJEJUNOSTOMY  03/20/2018   WITH JEJUNAL FEEDING TUBE  PLACEMENT  . GASTROJEJUNOSTOMY N/A 03/20/2018   Procedure: GASTROJEJUNOSTOMY WITH JEJUNAL FEEDING TUBE PLACEMENT;  Surgeon: [name], MD;  Location: [facility];  Service: General;  Laterality: N/A;  . LAPAROSCOPY N/A 03/20/2018   Procedure: LAPAROSCOPY DIAGNOSTIC ;  Surgeon: [name], MD;  Location: [facility];  Service: General;  Laterality: N/A;  . LYSIS OF ADHESION N/A 03/20/2018   Procedure: LYSIS OF ADHESION;  Surgeon: [name], MD;  Location: [facility];  Service: General;  Laterality: N/A;  . UPPER ESOPHAGEAL ENDOSCOPIC ULTRASOUND (EUS) N/A 02/19/2018   Procedure: UPPER ESOPHAGEAL ENDOSCOPIC ULTRASOUND (EUS);  Surgeon: Carol Ada, MD;  Location: [facility];  Service: Endoscopy;  Laterality: N/A;    SOCIAL HISTORY: Social History   Socioeconomic History  . Marital status: Divorced    Spouse name: Not on file  . Number of children: Not on file  . Years of education: Not on file  . Highest education level: Not on file  Occupational History  . Not on file  Social Needs  . Financial resource strain: Not on file  . Food insecurity:    Worry: Not on file    Inability: Not on file  . Transportation needs:    Medical: Not on file    Non-medical: Not on file  Tobacco Use  . Smoking status: Never Smoker  . Smokeless tobacco: Never Used  Substance and Sexual Activity  . Alcohol use: Not Currently  . Drug use: Not Currently  . Sexual activity: Not on file  Lifestyle  . Physical activity:    Days per week: Not on file    Minutes per session: Not on file  . Stress: Not on file  Relationships  . Social connections:    Talks on phone: Not on file    Gets together: Not on file    Attends religious service: Not on file    Active member of club or organization: Not on file    Attends meetings of clubs or organizations: Not on file    Relationship status: Not on file  . Intimate partner violence:    Fear of current or ex partner: Not on file    Emotionally abused: Not on file     Physically abused: Not on file    Forced sexual activity: Not on file  Other Topics Concern  . Not on file  Social History Narrative  . Not on file    FAMILY HISTORY: Family History  Problem Relation Age of Onset  . Liver disease Mother        d.46  . Bone cancer Father        cancer in his back  . Colon cancer Daughter 49    ALLERGIES:  is allergic to tape.  MEDICATIONS:  Current Facility-Administered Medications  Medication Dose Route Frequency Provider Last Rate Last Dose  . acetaminophen (TYLENOL) solution 325-650 mg  325-650 mg Oral Q6H PRN Johnathan Hausen, MD   650 mg at 03/25/18 1723  . alum & mag hydroxide-simeth (MAALOX/MYLANTA)  200-200-20 MG/5ML suspension 30 mL  30 mL Oral Q4H PRN [name], MD   30 mL at 03/26/18 0122  . dextrose 5 % and 0.45 % NaCl with KCl 20 mEq/L infusion   Intravenous Continuous [name], MD 50 mL/hr at 03/26/18 1206    . diphenhydrAMINE (BENADRYL) 12.5 MG/5ML elixir 12.5 mg  12.5 mg Oral Q6H PRN [name], MD       Or  . diphenhydrAMINE (BENADRYL) injection 12.5 mg  12.5 mg Intravenous Q6H PRN [name], MD   12.5 mg at 03/24/18 0156  . famotidine (PEPCID) IVPB 20 mg premix  20 mg Intravenous Q24H [name], MD 100 mL/hr at 03/26/18 0858 20 mg at 03/26/18 0858  . feeding supplement (OSMOLITE 1.2 CAL) liquid 1,000 mL  1,000 mL Per Tube Continuous [name], MD   Stopped at 03/26/18 0023  . HYDROmorphone (DILAUDID) injection 1 mg  1 mg Intravenous Q3H PRN Cornett, Thomas, MD   1 mg at 03/26/18 1212  . ibuprofen (ADVIL,MOTRIN) 100 MG/5ML suspension 400 mg  400 mg Oral Q8H PRN Georganna Skeans, MD   400 mg at 03/25/18 2004  . lactated ringers infusion   Intravenous Continuous [name], MD   Stopped at 03/20/18 1157  . menthol-cetylpyridinium (CEPACOL) lozenge 3 mg  1 lozenge Oral PRN [name], MD      . methocarbamol (ROBAXIN) 500 mg in dextrose 5 % 50 mL IVPB  500 mg Intravenous Q6H PRN [name], MD 100  mL/hr at 03/26/18 1110 500 mg at 03/26/18 1110  . metoCLOPramide (REGLAN) injection 5 mg  5 mg Intravenous Once [name], MD      . ondansetron (ZOFRAN-ODT) disintegrating tablet 4 mg  4 mg Oral Q6H PRN [name], MD       Or  . ondansetron (ZOFRAN) injection 4 mg  4 mg Intravenous Q6H PRN [name], MD   4 mg at 03/26/18 0609  . oxyCODONE (ROXICODONE) 5 MG/5ML solution 5 mg  5 mg Oral Q4H PRN Johnathan Hausen, MD   5 mg at 03/25/18 1349  . phenol (CHLORASEPTIC) mouth spray 1 spray  1 spray Mouth/Throat PRN [name], MD      . promethazine (PHENERGAN) injection 12.5 mg  12.5 mg Intravenous Q4H PRN Erroll Luna, MD   12.5 mg at 03/26/18 1207  . simethicone (MYLICON) 40 CB/4.4HQ suspension 40 mg  40 mg Oral Q6H PRN [name], MD      . sodium chloride (OCEAN) 0.65 % nasal spray 1 spray  1 spray Each Nare PRN [name], MD      . thyroid (ARMOUR) tablet 30 mg  30 mg Oral QAC breakfast [name], MD   30 mg at 03/25/18 0815    REVIEW OF SYSTEMS:   Unable to obtain review of systems due to Janice Janice [name]'s mental status.  PHYSICAL EXAMINATION: ECOG PERFORMANCE STATUS: 3 - Symptomatic, >50% confined to bed  Vitals:   03/25/18 2338 03/26/18 0638  BP: 92/64 (!) 113/59  Pulse: 94 75  Resp: 18 16  Temp: 98 F (36.7 C) 98.4 F (36.9 C)  SpO2: 99% 97%   Filed Weights   03/20/18 0810 03/22/18 0700 03/24/18 0500  Weight: 121 lb 9.6 oz (55.2 kg) 131 lb 9.8 oz (59.7 kg) 132 lb 4.4 oz (60 kg)    GENERAL: sleepy but arousable, moderate discomfort during episodes of emesis, but otherwise resting in bed without significant distress SKIN: no skin lesion noted  EYES: conjunctiva are pink  and non-injected OROPHARYNX: dry oral mucosa; no visualized oral lesion NECK: thin LUNGS: clear to auscultation and percussion with normal breathing effort HEART: regular rate & rhythm, no murmurs, no lower extremity edema ABDOMEN: soft, mildly distended, mild generalized tenderness  with palpation, decreased bowel sounds  PSYCH: drowsy NEURO: unable to assess due Janice Janice [name]'s mental status   LABORATORY DATA:  I have reviewed Janice data as listed Lab Results  Component Value Date   WBC 6.9 03/25/2018   HGB 11.2 (L) 03/25/2018   HCT 34.1 (L) 03/25/2018   MCV 94.5 03/25/2018   PLT 241 03/25/2018   Lab Results  Component Value Date   NA 138 03/26/2018   K 4.5 03/26/2018   CL 98 03/26/2018   CO2 29 03/26/2018    RADIOGRAPHIC STUDIES: I have personally reviewed Janice radiological images as listed and agreed with Janice findings in Janice report. Ct Chest W Contrast  Result Date: 02/28/2018 CLINICAL DATA:  Pancreatic adenocarcinoma.  Evaluate for metastasis. EXAM: CT CHEST WITH CONTRAST TECHNIQUE: Multidetector CT imaging of Janice chest was performed during intravenous contrast administration. CONTRAST:  24mL ISOVUE-300 IOPAMIDOL (ISOVUE-300) INJECTION 61% COMPARISON:  02/16/2018 abdominal MRI. 02/14/2018 abdominal CT. Chest radiograph 12/16/2016. FINDINGS: Cardiovascular: Aortic and branch vessel atherosclerosis. Tortuous thoracic aorta. Normal heart size, without pericardial effusion. No central pulmonary embolism, on this non-dedicated study. Mediastinum/Nodes: No supraclavicular adenopathy. No mediastinal or hilar adenopathy. Lungs/Pleura: No pleural fluid. Basilar and subpleural predominant pulmonary nodules measure on Janice order of 5 mm and less, identified on series 3. Mild mucoid impaction suspected within Janice anteromedial right middle lobe including on image 82/3. Upper Abdomen: Atrophic lateral segment left liver lobe, nonspecific. Normal imaged portions of Janice spleen, stomach, adrenal glands, kidneys. Pancreatic uncinate process lesion not imaged. Abdominal aortic atherosclerosis. Musculoskeletal: Mild thoracic spondylosis. Mild convex right thoracic spine curvature. IMPRESSION: 1. Basilar and subpleural predominant pulmonary nodules are indeterminate. Benign etiology (most  likely subpleural lymph nodes) favored. Recommend attention on follow-up. 2. No thoracic adenopathy. 3.  Aortic Atherosclerosis (ICD10-I70.0). Electronically Signed   By: Abigail Miyamoto M.D.   On: 02/28/2018 15:10    PATHOLOGY: I have reviewed Janice pathology reports as documented in Janice oncologist history.

## 2018-03-26 NOTE — Progress Notes (Signed)
Patient ID: Janice [name], female   DOB: 07-20-1940, 77 y.o.   MRN: [id number] [facility] Surgery Progress Note:   POD 6  Subjective: Had a horrible evening/night yesterday.  She had significant pain and nausea.  This followed progression to soft diet and advance in tube feeds.  She required numerous IV doses of pain medication as well as breakthrough nausea medication.  She said this morning she "just wanted to stop it all and stop."    Previously she had been stable with IV robaxin and ibuprofen via tube.  She also was tolerating full liquids.    Objective: Vital signs in last 24 hours: Temp:  [97.6 F (36.4 C)-98.4 F (36.9 C)] 98.4 F (36.9 C) (10/15 6948) Pulse Rate:  [59-94] 75 (10/15 0638) Resp:  [16-18] 16 (10/15 0638) BP: (92-141)/(55-64) 113/59 (10/15 0638) SpO2:  [97 %-100 %] 97 % (10/15 5462)  Intake/Output from previous day: 10/14 0701 - 10/15 0700 In: 1781.3 [P.O.:300; I.V.:830.8; NG/GT:600.5; IV Piggyback:50] Out: 100 [Urine:100] Intake/Output this shift: Total I/O In: -  Out: 100 [Emesis/NG output:100]  Physical Exam:  Very sleepy, just got dilaudid and phenergan.   Breathing comfortably Abd soft, non distended,  Non tender.     Lab Results:  Results for orders placed or performed during the hospital encounter of 03/20/18 (from the past 48 hour(s))  Glucose, capillary     Status: Abnormal   Collection Time: 03/24/18 11:56 AM  Result Value Ref Range   Glucose-Capillary 147 (H) 70 - 99 mg/dL  Glucose, capillary     Status: Abnormal   Collection Time: 03/24/18  4:39 PM  Result Value Ref Range   Glucose-Capillary 133 (H) 70 - 99 mg/dL  Glucose, capillary     Status: Abnormal   Collection Time: 03/24/18  8:12 PM  Result Value Ref Range   Glucose-Capillary 112 (H) 70 - 99 mg/dL  Glucose, capillary     Status: Abnormal   Collection Time: 03/25/18 12:38 AM  Result Value Ref Range   Glucose-Capillary 147 (H) 70 - 99 mg/dL  CBC     Status: Abnormal   Collection Time: 03/25/18  1:41 AM  Result Value Ref Range   WBC 6.9 4.0 - 10.5 K/uL   RBC 3.61 (L) 3.87 - 5.11 MIL/uL   Hemoglobin 11.2 (L) 12.0 - 15.0 g/dL   HCT 34.1 (L) 36.0 - 46.0 %   MCV 94.5 80.0 - 100.0 fL   MCH 31.0 26.0 - 34.0 pg   MCHC 32.8 30.0 - 36.0 g/dL   RDW 12.9 11.5 - 15.5 %   Platelets 241 150 - 400 K/uL   nRBC 0.0 0.0 - 0.2 %    Comment: Performed at [facility], [address], [place], [place] [address]  Basic metabolic panel     Status: Abnormal   Collection Time: 03/25/18  1:41 AM  Result Value Ref Range   Sodium 137 135 - 145 mmol/L   Potassium 3.8 3.5 - 5.1 mmol/L   Chloride 103 98 - 111 mmol/L   CO2 25 22 - 32 mmol/L   Glucose, Bld 138 (H) 70 - 99 mg/dL   BUN 7 (L) 8 - 23 mg/dL   Creatinine, Ser 0.50 0.44 - 1.00 mg/dL   Calcium 8.6 (L) 8.9 - 10.3 mg/dL   GFR calc non Af Amer >60 >60 mL/min   GFR calc Af Amer >60 >60 mL/min    Comment: (NOTE) The eGFR has been calculated using the CKD EPI  equation. This calculation has not been validated in all clinical situations. eGFR's persistently <60 mL/min signify possible Chronic Kidney Disease.    Anion gap 9 5 - 15    Comment: Performed at [facility] [address], [place], [place] [address]  Glucose, capillary     Status: Abnormal   Collection Time: 03/25/18  4:47 AM  Result Value Ref Range   Glucose-Capillary 131 (H) 70 - 99 mg/dL  Glucose, capillary     Status: Abnormal   Collection Time: 03/25/18  8:00 AM  Result Value Ref Range   Glucose-Capillary 111 (H) 70 - 99 mg/dL  Glucose, capillary     Status: Abnormal   Collection Time: 03/25/18  1:02 PM  Result Value Ref Range   Glucose-Capillary 146 (H) 70 - 99 mg/dL  Glucose, capillary     Status: Abnormal   Collection Time: 03/25/18  5:31 PM  Result Value Ref Range   Glucose-Capillary 109 (H) 70 - 99 mg/dL  Glucose, capillary     Status: Abnormal   Collection Time: 03/25/18  9:01 PM  Result Value Ref Range   Glucose-Capillary 145  (H) 70 - 99 mg/dL  Glucose, capillary     Status: Abnormal   Collection Time: 03/25/18 11:56 PM  Result Value Ref Range   Glucose-Capillary 157 (H) 70 - 99 mg/dL  Basic metabolic panel     Status: Abnormal   Collection Time: 03/26/18  6:58 AM  Result Value Ref Range   Sodium 138 135 - 145 mmol/L   Potassium 4.5 3.5 - 5.1 mmol/L   Chloride 98 98 - 111 mmol/L   CO2 29 22 - 32 mmol/L   Glucose, Bld 137 (H) 70 - 99 mg/dL   BUN 11 8 - 23 mg/dL   Creatinine, Ser 0.55 0.44 - 1.00 mg/dL   Calcium 9.4 8.9 - 10.3 mg/dL   GFR calc non Af Amer >60 >60 mL/min   GFR calc Af Amer >60 >60 mL/min    Comment: (NOTE) The eGFR has been calculated using the CKD EPI equation. This calculation has not been validated in all clinical situations. eGFR's persistently <60 mL/min signify possible Chronic Kidney Disease.    Anion gap 11 5 - 15    Comment: Performed at [facility] [address], [place], [place] [address]  Glucose, capillary     Status: Abnormal   Collection Time: 03/26/18  8:22 AM  Result Value Ref Range   Glucose-Capillary 120 (H) 70 - 99 mg/dL    Radiology/Results: No results found.  Anti-infectives: Anti-infectives (From admission, onward)   Start     Dose/Rate Route Frequency Ordered Stop   03/20/18 0745  ceFAZolin (ANCEF) IVPB 2g/100 mL premix     2 g 200 mL/hr over 30 Minutes Intravenous On call to O.R. 03/20/18 0737 03/20/18 0935   03/20/18 0740  ceFAZolin (ANCEF) 2-4 GM/100ML-% IVPB    Note to Pharmacy:  Barbie Haggis   : cabinet override      03/20/18 0740 03/20/18 0935      Assessment/Plan: Problem List: Patient Active Problem List   Diagnosis Date Noted  . Malnutrition of moderate degree 03/22/2018  . 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 Pancreatic cancer metastasized to liver ([place]) 03/20/2018  . Pancreatic adenocarcinoma ([place]) 02/25/2018  . Headache 12/17/2016  . Confusion 12/17/2016  . Hypothyroidism 12/17/2016  . Acute ischemic stroke ([place])  12/17/2016  . Ischemic stroke of frontal lobe ([place]) 12/16/2016   Protein calorie malnutrition,  moderate. Feeding tube for nutritional support. PT/OT  Pt had clear set back from advancing diet.  Will rest gut today and do IV meds today.  Will add simethicone as some of this may be gas pain.    Will get palliative care involved.  My vision is that we will get back to full liquids and tube feeds as a discharge plan.  She does have stage IV pancreatic cancer, but if she has no oral nutrition, she will not survive long.  If we can get her to a stable state, she can get stronger and have some time with friends even if she does not want to have cancer treatments.    D/c on hold at this point until stable.      LOS: 6 days     [facility]  [phone]  03/26/2018 8:55 AM

## 2018-03-26 NOTE — Progress Notes (Signed)
2200 patient questioned the approach of palliative care. "I just want to be put to sleep and not wake up."  Explained we are working on increasing comfort, we can adjust medications with the doctors if we are not controlling pain. Patient has been sleeping most of the time, had some pain around 2100 that seemed to be gas related, sat in the chair at bedside to try to alleviate it.  Continued emesis, dark green, whenever moves from bed to chair or changes positions. Scheduled Ativan and Tylenol given, along with simethicone and patient is resting comfortably at 2300.

## 2018-03-27 ENCOUNTER — Inpatient Hospital Stay: Payer: Medicare PPO | Attending: Hematology | Admitting: Hematology

## 2018-03-27 LAB — GLUCOSE, CAPILLARY
GLUCOSE-CAPILLARY: 136 mg/dL — AB (ref 70–99)
GLUCOSE-CAPILLARY: 130 mg/dL — AB (ref 70–99)
GLUCOSE-CAPILLARY: 95 mg/dL (ref 70–99)
Glucose-Capillary: 116 mg/dL — ABNORMAL HIGH (ref 70–99)

## 2018-03-27 MED ORDER — BOOST / RESOURCE BREEZE PO LIQD CUSTOM
1.0000 | Freq: Three times a day (TID) | ORAL | Status: DC
  Administered 2018-03-27 (×2): 1 via ORAL

## 2018-03-27 NOTE — Progress Notes (Signed)
Nutrition Follow-up  DOCUMENTATION CODES:   Non-severe (moderate) malnutrition in context of chronic illness  INTERVENTION:   -Boost Breeze po TID, each supplement provides 250 kcal and 9 grams of protein -RD will will monitor and adjust care plan based upon goals of care and possible resumption of TF  NUTRITION DIAGNOSIS:   Moderate Malnutrition related to chronic illness(stage IV pancreatic cancer) as evidenced by energy intake < or equal to 75% for > or equal to 1 month, mild fat depletion, moderate fat depletion, mild muscle depletion, moderate muscle depletion.  Progressing  GOAL:   Patient will meet greater than or equal to 90% of their needs  Progressing  MONITOR:   PO intake, Supplement acceptance, Diet advancement, Labs, Weight trends, Skin, I & O's  REASON FOR ASSESSMENT:   Malnutrition Screening Tool, Consult Enteral/tube feeding initiation and management  ASSESSMENT:   77 yo F referred by Dr. Benson Norway for a new diagnosis of pancreatic cancer 02/2018.  She presented with intermittent lower abdominal pain in August 2019 and thought it was constipation. She saw GI and due to concerns of possible diverticulitis flare, a CT was perfomed.  An incidental pancreatic mass was noted.  This was further worked up wtih MRI, EUS/biopsy, and chest CT.  Biopsies were positive for adenocarcinoma.  Staging was negative for metastatic disease.  She denies jaundice or diabetes.  She has no diarrhea.  She feels reasonably energetic and does what she wants to do with minimal limitation  10/8-s/procedure(s): Diagnostic laparoscopy, liver biopsy, gastrojejunostomy, jejunostomy feeding tube placement 10/11- NGT d/c, advanced to clear liquid diet 10/14- advanced to soft diet  Case discussed with RN. She reports that TF were stopped as of 03/26/18 AM, due to pt complaints of vomiting and emesis. Diet was downgraded to liquid diet at this time. Pt is consuming very minimal PO's- bites of food  and sips of Boost with family encouragement. Pt is refusing to resume TF at this time due to pain. Palliative care team to re-visit pt again today to better address goals of care (apparently pt was very groggy at last visit).   Labs reviewed: CBGS: 120-140.   Diet Order:   Diet Order            Diet full liquid Room service appropriate? Yes; Fluid consistency: Thin  Diet effective now              EDUCATION NEEDS:   Education needs have been addressed  Skin:  Skin Assessment: Skin Integrity Issues: Skin Integrity Issues:: Incisions Incisions: closed abdomen  Last BM:  03/26/18  Height:   Ht Readings from Last 1 Encounters:  03/20/18 5\' 2"  (1.575 m)    Weight:   Wt Readings from Last 1 Encounters:  03/24/18 60 kg    Ideal Body Weight:  50 kg  BMI:  Body mass index is 24.19 kg/m.  Estimated Nutritional Needs:   Kcal:  1650-1850  Protein:  80-95 grams  Fluid:  >1.6 L    Efe Fazzino A. Jimmye Norman, RD, LDN, CDE Pager: [phone] After hours Pager: [phone]

## 2018-03-27 NOTE — Progress Notes (Signed)
Physical Therapy Treatment Patient Details Name: Janice [name] MRN: [id number] DOB: 1941-04-22 Today's Date: 03/27/2018    History of Present Illness 77yo female with new diagnosis of pancreatic cancer, negative for metastatic disease. She received laparoscopy, lysis of adhesion, and gastrojejunostomy with J tube placement on 03/20/18. PMH CVA, head injury, hx brain surgery     PT Comments    Pt is feeling better today than yesterday as her pain is being more well controlled. Pt hopeful that ambulation will make her feel better. Pt has had a decrease in her mobility due to increased fatigue and decreased strength. Pt now requires min A for bed mobility and mod A for transfers and ambulation with RW. D/c plans remain appropriate at this time however pt is now being followed by palliative and plan of care may change.    Follow Up Recommendations  Home health PT     Equipment Recommendations  None recommended by PT       Precautions / Restrictions Precautions Precautions: Fall;Other (comment) Precaution Comments: J-tube Restrictions Weight Bearing Restrictions: No    Mobility  Bed Mobility Overal bed mobility: Needs Assistance Bed Mobility: Sit to Supine     Supine to sit: HOB elevated;Min assist     General bed mobility comments: minA for pt to pull against therapist arm to bring herself to EoB, vc for reaching to handrail to bring hips around to EoB  Transfers Overall transfer level: Needs assistance Equipment used: Rolling walker (2 wheeled) Transfers: Sit to/from [facility] Sit to Stand: Min assist Stand pivot transfers: Mod assist       General transfer comment: mod A for pivoting to BSC, vc for hand placement on far handrest to bring hips around to [facility], minA for sit<>stand from [facility] and from straight chair  Ambulation/Gait Ambulation/Gait assistance: Mod assist Gait Distance (Feet): 150 Feet Assistive device: Rolling walker (2 wheeled) Gait  Pattern/deviations: Step-through pattern;Decreased stride length;Trunk flexed Gait velocity: decreased  Gait velocity interpretation: <1.8 ft/sec, indicate of risk for recurrent falls General Gait Details: pt began ambulation with minA for steadying which progressed to [place] for steadying as she fatigued, 1x knee buckling prior to sitting in recliner          Balance Overall balance assessment: Needs assistance Sitting-balance support: Bilateral upper extremity supported;Feet supported Sitting balance-Leahy Scale: Fair     Standing balance support: Single extremity supported;During functional activity Standing balance-Leahy Scale: Poor Standing balance comment: requires support to maintain balance                            Cognition Arousal/Alertness: Lethargic;Suspect due to medications Behavior During Therapy: WFL for tasks assessed/performed Overall Cognitive Status: Within Functional Limits for tasks assessed                                 General Comments: pt with increased pain medication and slightly lethargic       Exercises      General Comments General comments (skin integrity, edema, etc.): sister present during session, VSS      Pertinent Vitals/Pain Pain Assessment: 0-10 Pain Score: 3  Pain Location: abdomen Pain Descriptors / Indicators: Discomfort;Sore Pain Intervention(s): Limited activity within patient's tolerance;Monitored during session;Repositioned           PT Goals (current goals can now be found in the care plan section) Acute Rehab PT Goals  Patient Stated Goal: wants to walk more PT Goal Formulation: With patient Time For Goal Achievement: 04/05/18 Potential to Achieve Goals: Fair Progress towards PT goals: Not progressing toward goals - comment(increased pain medication and decreased endurance)    Frequency    Min 3X/week      PT Plan Current plan remains appropriate;Other (comment)(followed by  palliative care and d/c plans may change)    Co-evaluation PT/OT/SLP Co-Evaluation/Treatment: Yes Reason for Co-Treatment: Complexity of the patient's impairments (multi-system involvement) PT goals addressed during session: Mobility/safety with mobility        AM-PAC PT "6 Clicks" Daily Activity  Outcome Measure  Difficulty turning over in bed (including adjusting bedclothes, sheets and blankets)?: A Little Difficulty moving from lying on back to sitting on the side of the bed? : A Lot Difficulty sitting down on and standing up from a chair with arms (e.g., wheelchair, bedside commode, etc,.)?: A Little Help needed moving to and from a bed to chair (including a wheelchair)?: A Little Help needed walking in hospital room?: A Little Help needed climbing 3-5 steps with a railing? : A Little 6 Click Score: 17    End of Session Equipment Utilized During Treatment: Gait belt Activity Tolerance: Patient tolerated treatment well;Patient limited by fatigue Patient left: with family/visitor present;Other (comment)(on BSC, daughter to assist back to bed) Nurse Communication: Mobility status;Other (comment)(pt on [facility]) PT Visit Diagnosis: Muscle weakness (generalized) (M62.81)     Time: 9784-7841 PT Time Calculation (min) (ACUTE ONLY): 28 min  Charges:  $Gait Training: 8-22 mins                     Damyiah Moxley B. Migdalia Dk PT, DPT Acute Rehabilitation Services Pager [phone] Office [phone]    [place] 03/27/2018, 1:49 PM

## 2018-03-27 NOTE — Progress Notes (Addendum)
Daily Progress Note   Patient Name: Janice [name]       Date: 03/27/2018 DOB: Nov 05, 1940  Age: 77 y.o. MRN#: [id number] Attending Physician: Janice Klein, MD Primary Care Physician: Janice Rim, MD Admit Date: 03/20/2018  Reason for Consultation/Follow-up: Establishing goals of care and Pain control  Subjective: Patient wakes to voice. She is alert and oriented. She denies pain currently and states relief from ativan. She is not nauseous during my visit. She takes bites of pudding.   GOC: Follow-up GOC with patient. Also discussed with patient's daughter, sister, and HCPOA Janice [name]).  First spoke with patient. Janice "Janice [name]" symptoms are better today. She has tolerated bites of ice cream and will accept pudding for me.   She asked questions about palliative care. Educated on role of palliative care.   Discussed course of hospitalization including diagnoses and interventions. Patient does understand she has metastatic cancer. She understands her poor prognosis. We discussed nutrition playing a part in her prognosis. Janice [name] again tells me today she does NOT want tube feeds restarted. I explained to her that if she only continues to take bites and sips, she will likely not survive longer than a few weeks. Janice [name] confirms understanding.   I attempted to elicit values and goals of care important to the patient. Janice [name] tells me she wants to be "comfortable" and "die with dignity." She wishes to be given medication and fall asleep. I explained in detail role of palliative and comfort focused care. I emphasized to Janice [name] that the care team cannot hasten her death but ensure comfort, quality, and dignity for her remaining days of life. Also allow nature to take it's course and not prolong this  process with tube feeds, if that is not her wish. Janice [name] wishes for comfort.   Explored her fears with EOL. Janice [name] is afraid to "suffer" and continue to be in pain/nausea. I reassured her again that we will manage her symptoms and give her medication she needs to maintain comfort and dignity.   The difference between aggressive medical intervention and comfort care was considered in light of the patient's goals of care.   We discussed discharge options including SNF for physical therapy versus hospice options. Discussed hospice philosophy and focus on comfort and dignity.   **Discussed further with POA, daughter, and sister in hall.  They have a good understanding of diagnoses and interventions. They understand prognosis could be weeks or less if Janice [name] continues with poor nutritional status. They respect her decision that she does not want tube feeds. Janice [name] remains hopeful that if symptoms are continued to be managed, she will be motivated to eat. Daughter and sister share that Janice [name] is "doom and gloom" and no longer has the will to live knowing poor prognosis. Discussed at length rehab versus hospice options. Family agrees it would be against her wishes to force physical therapy. They share that going home with hospice is not feasible, knowing she needs 24/7 care. Considering hospice facility. Daughter, Janice [name] is contacting Linda's other daughter in [place]. Educated on EOL expectations.   Questions and concerns were addressed.  Hard Choices booklet left for review. The family was encouraged to call with questions or concerns.  PMT will continue to support holistically.  Length of Stay: 7  Current Medications: Scheduled Meds:  . acetaminophen (TYLENOL) oral liquid 160 mg/5 mL  650 mg Per Tube TID  . feeding supplement  1 Container Oral TID BM  . LORazepam  0.5 mg Intravenous Q8H  . thyroid  30 mg Oral QAC breakfast    Continuous Infusions: . dextrose 5 % and 0.45 % NaCl with KCl 20 mEq/L 50 mL/hr  at 03/27/18 0559  . famotidine (PEPCID) IV 20 mg (03/27/18 0821)  . feeding supplement (OSMOLITE 1.2 CAL) Stopped (03/26/18 0023)  . lactated ringers Stopped (03/20/18 1157)  . methocarbamol (ROBAXIN) IV 500 mg (03/27/18 0602)    PRN Meds: alum & mag hydroxide-simeth, diphenhydrAMINE **OR** diphenhydrAMINE, HYDROmorphone (DILAUDID) injection, menthol-cetylpyridinium, methocarbamol (ROBAXIN) IV, ondansetron **OR** ondansetron (ZOFRAN) IV, oxyCODONE, phenol, promethazine, simethicone, sodium chloride  Physical Exam  Constitutional: She is oriented to person, place, and time. She is cooperative. She appears ill.  HENT:  Head: Normocephalic and atraumatic.  Cardiovascular: Regular rhythm.  Pulmonary/Chest: No accessory muscle usage. No tachypnea. No respiratory distress.  Neurological: She is alert and oriented to person, place, and time.  Skin: Skin is warm and dry.  Psychiatric: She has a normal mood and affect. Her speech is normal and behavior is normal. Cognition and memory are normal.  Nursing note and vitals reviewed.          Vital Signs: BP (!) 98/52 (BP Location: Right Arm)   Pulse 96   Temp 99.5 F (37.5 C) (Oral)   Resp 17   Ht 5\' 2"  (1.575 m)   Wt 60 kg   SpO2 94%   BMI 24.19 kg/m  SpO2: SpO2: 94 % O2 Device: O2 Device: Room Air O2 Flow Rate: O2 Flow Rate (L/min): 0 L/min  Intake/output summary:   Intake/Output Summary (Last 24 hours) at 03/27/2018 1441 Last data filed at 03/27/2018 1143 Gross per 24 hour  Intake 1498.32 ml  Output 125 ml  Net 1373.32 ml   LBM: Last BM Date: 03/26/18 Baseline Weight: Weight: 55.2 kg Most recent weight: Weight: 60 kg       Palliative Assessment/Data: PPS 20%   Flowsheet Rows     Most Recent Value  Intake Tab  Referral Department  Surgery  Unit at Time of Referral  Med/Surg Unit  Palliative Care Primary Diagnosis  Cancer  Palliative Care Type  New Palliative care  Reason for referral  Clarify Goals of Care  Date  first seen by Palliative Care  03/26/18  Clinical Assessment  Palliative Performance Scale Score  20%  Psychosocial & Spiritual Assessment  Palliative Care  Outcomes  Patient/Family meeting held?  Yes  Who was at the meeting?  patient, friend/HCPOA  Palliative Care Outcomes  Clarified goals of care, ACP counseling assistance, Improved pain interventions, Improved non-pain symptom therapy, Provided end of life care assistance, Changed CPR status, Changed to focus on comfort, Provided psychosocial or spiritual support, Counseled regarding hospice      Patient Active Problem List   Diagnosis Date Noted  . Palliative care by specialist   . Goals of care, counseling/discussion   . Cancer related pain   . Nausea and vomiting   . Anxiety state   . Malnutrition of moderate degree 03/22/2018  . 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 Pancreatic adenocarcinoma ([place]) 02/25/2018  . Headache 12/17/2016  . Confusion 12/17/2016  . Hypothyroidism 12/17/2016  . Acute ischemic stroke ([place]) 12/17/2016  . Ischemic stroke of frontal lobe ([place]) 12/16/2016    Palliative Care Assessment & Plan   Patient Profile: 77 y.o. female  with past medical history of CVA, hypothyroidism, pancreatic cancer, GERD admitted on 03/20/2018 for pancreatic cancer. Patient presented with intermittent lower abdominal pain in August 2019. CT performed revealed pancreatic mass. Further workup with MRI, EUS/biopsy, and chest CT. Biopsies positive for adenocarcinoma. Staging was negative for metastatic disease. Whipple procedure scheduled. 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 Whipple was not performed. Liver biopsy positive for adenocarcinoma. Postop course has been challenging with continued nausea and intractable pain. Palliative medicine consultation for symptom management.   Assessment: Metastatic pancreatic  cancer Intractable pain Nausea and vomiting Moderate protein calorie malnutrition  Recommendations/Plan:  Continue current medication regimen. Symptoms better controlled today. Patient denies pain or nausea during my visit.   Further discussed GOC with patient and her family. Patient speaks clearly of her wishes for "comfort" and to "die with dignity." She again refuses for tube feedings to be restarted. She does not want life-prolonging interventions.   Discussed in detail comfort focused care and that palliative care will not hasten her death (as she thinks) but will ensure comfort, quality, and dignity at EOL.   Discussed outpatient SNF rehab versus hospice options. Discussed in detail with family including HCPOA.  Patient wishes to be comfortable. She continues to refuse re-initiation of tube feeds. She is only taking bites and sips. She is ready to die. Recommend shift to comfort measures and transition to hospice facility per patient wishes. Paged Dr. Barry Dienes. Will further discuss with attending in AM.   Code Status: DNR/DNI   Code Status Orders  (From admission, onward)         Start     Ordered   03/26/18 1555  Do not attempt resuscitation (DNR)  Continuous    Question Answer Comment  In the event of cardiac or respiratory ARREST Do not call a "code blue"   In the event of cardiac or respiratory ARREST Do not perform Intubation, CPR, defibrillation or ACLS   In the event of cardiac or respiratory ARREST Use medication by any route, position, wound care, and other measures to relive pain and suffering. May use oxygen, suction and manual treatment of airway obstruction as needed for comfort.      03/26/18 1554        Code Status History    Date Active Date Inactive Code Status Order ID Comments User Context   03/20/2018 1349 03/26/2018 1554 Full Code [id number]  Janice Klein, MD Inpatient   12/17/2016 0131 12/19/2016  2151 Full Code [id number]  OpydIlene Qua, MD Inpatient      Advance Directive Documentation     Most Recent Value  Type of Advance Directive  Living will, Healthcare Power of Attorney  Pre-existing out of facility DNR order (yellow form or pink MOST form)  -  "MOST" Form in Place?  -       Prognosis:   Poor prognosis likely weeks with metastatic pancreatic cancer, declining nutritional status, and ongoing pain/nausea  Discharge Planning:  To Be Determined  Care plan was discussed with patient, daughter, sister, friend/HCPOA Janice [name]), and RN   Thank you for allowing the Palliative Medicine Team to assist in the care of this patient.   Time In: 1315 Time Out: 1435 Total Time 12min Prolonged Time Billed yes      Greater than 50%  of this time was spent counseling and coordinating care related to the above assessment and plan.  Ihor Dow, FNP-C Palliative Medicine Team  Phone: [phone] Fax: [phone]  Please contact Palliative Medicine Team phone at [phone] for questions and concerns.

## 2018-03-27 NOTE — Progress Notes (Signed)
Occupational Therapy Treatment Patient Details Name: SHAKERIA ROBINETTE MRN: [id number] DOB: 1940-08-30 Today's Date: 03/27/2018    History of present illness 77yo female with new diagnosis of pancreatic cancer, negative for metastatic disease. She received laparoscopy, lysis of adhesion, and gastrojejunostomy with J tube placement on 03/20/18. PMH CVA, head injury, hx brain surgery    OT comments  Pt demonstrates decr activity tolerance and requires seated resting breaks. Pt motivated to water plants this session and progress out of the room doors. Pt with sister present to cheer on the patient. Pt will continue to benefit from SNF at next venue.    Follow Up Recommendations  SNF    Equipment Recommendations  3 in 1 bedside commode;Other (comment)    Recommendations for Other Services  palliative care ( on board and currently continue to recommend)    Precautions / Restrictions Precautions Precautions: Fall;Other (comment) Precaution Comments: J-tube Restrictions Weight Bearing Restrictions: No       Mobility Bed Mobility Overal bed mobility: Needs Assistance Bed Mobility: Sit to Supine     Supine to sit: HOB elevated;Min assist     General bed mobility comments: minA for pt to pull against therapist arm to bring herself to EoB, vc for reaching to handrail to bring hips around to EoB  Transfers Overall transfer level: Needs assistance Equipment used: Rolling walker (2 wheeled) Transfers: Sit to/from [facility] Sit to Stand: Min assist Stand pivot transfers: Mod assist       General transfer comment: mod A for pivoting to BSC, vc for hand placement on far handrest to bring hips around to [facility], minA for sit<>stand from [facility] and from straight chair    Balance Overall balance assessment: Needs assistance Sitting-balance support: Bilateral upper extremity supported;Feet supported Sitting balance-Leahy Scale: Fair     Standing balance support: Single  extremity supported;During functional activity Standing balance-Leahy Scale: Poor Standing balance comment: requires support to maintain balance                           ADL either performed or assessed with clinical judgement   ADL Overall ADL's : Needs assistance/impaired Eating/Feeding: Set up Eating/Feeding Details (indicate cue type and reason): drinking water from cup  Grooming: Moderate assistance;Standing Grooming Details (indicate cue type and reason): sister combing hair while pt in static standing                 Toilet Transfer: Minimal assistance;Stand-pivot Toilet Transfer Details (indicate cue type and reason): pt with mod cues for hand placement and positionign for safety Toileting- Clothing Manipulation and Hygiene: Min guard;Sitting/lateral lean       Functional mobility during ADLs: Rolling walker General ADL Comments: Pt motivated this session to walk to the sink and put ice chips in plants in teh room. pt using a spoon to place the ice chips in the [facility]     Vision       Perception     Praxis      Cognition Arousal/Alertness: Lethargic;Suspect due to medications Behavior During Therapy: [facility] for tasks assessed/performed Overall Cognitive Status: Within Functional Limits for tasks assessed                                 General Comments: pt with increased pain medication and slightly lethargic         Exercises     Shoulder  Instructions       General Comments sister present throughout session (14 yrs younger)    Pertinent Vitals/ Pain       Pain Assessment: 0-10 Pain Score: 3  Pain Location: abdomen Pain Descriptors / Indicators: Discomfort;Sore Pain Intervention(s): Monitored during session;Premedicated before session;Repositioned  Home Living                                          Prior Functioning/Environment              Frequency  Min 2X/week        Progress Toward  Goals  OT Goals(current goals can now be found in the care plan section)  Progress towards OT goals: Progressing toward goals  Acute Rehab OT Goals Patient Stated Goal: to get strength in legs OT Goal Formulation: With patient Time For Goal Achievement: 04/06/18 Potential to Achieve Goals: Good ADL Goals Pt Will Perform Grooming: with modified independence;standing Pt Will Perform Lower Body Bathing: with modified independence;sit to/from stand Pt Will Perform Upper Body Dressing: with modified independence;sitting Pt Will Perform Lower Body Dressing: with modified independence;sit to/from stand Pt Will Transfer to Toilet: with modified independence;ambulating;regular height toilet Pt Will Perform Toileting - Clothing Manipulation and hygiene: with modified independence;sit to/from stand Pt Will Perform Tub/Shower Transfer: Tub transfer;with modified independence;ambulating;3 in 1 Pt/caregiver will Perform Home Exercise Program: Increased strength;Both right and left upper extremity;Independently;With written HEP provided  Plan Discharge plan remains appropriate    Co-evaluation    PT/OT/SLP Co-Evaluation/Treatment: Yes Reason for Co-Treatment: Complexity of the patient's impairments (multi-system involvement);Necessary to address cognition/behavior during functional activity;For patient/therapist safety;To address functional/ADL transfers PT goals addressed during session: Mobility/safety with mobility OT goals addressed during session: ADL's and self-care;Proper use of Adaptive equipment and DME;Strengthening/ROM      AM-PAC PT "6 Clicks" Daily Activity     Outcome Measure   Help from another person eating meals?: A Lot Help from another person taking care of personal grooming?: A Little Help from another person toileting, which includes using toliet, bedpan, or urinal?: A Lot Help from another person bathing (including washing, rinsing, drying)?: A Lot Help from another  person to put on and taking off regular upper body clothing?: A Little Help from another person to put on and taking off regular lower body clothing?: A Lot 6 Click Score: 14    End of Session Equipment Utilized During Treatment: Rolling walker;Gait belt  OT Visit Diagnosis: Muscle weakness (generalized) (M62.81)   Activity Tolerance Patient tolerated treatment well;Patient limited by fatigue   Patient Left in bed;with call bell/phone within reach;with bed alarm set   Nurse Communication Mobility status        Time: 6812-7517 OT Time Calculation (min): 29 min  Charges: OT General Charges $OT Visit: 1 Visit OT Treatments $Self Care/Home Management : 8-22 mins   Jeri Modena, OTR/L  Acute Rehabilitation Services Pager: [phone] Office: [phone] .    Parke Poisson B 03/27/2018, 3:22 PM

## 2018-03-27 NOTE — Progress Notes (Signed)
Patient ID: Janice [name], female   DOB: April 09, 1941, 77 y.o.   MRN: [id number] [facility] Surgery Progress Note:   POD 6  Subjective: Pt had a much better night last night.  The daytime was still bad in terms of pain and nausea, but by 9-10 pm, it seemed to settle down much more.  The low dose ativan seemed to help with the muscle tightness, the anxiety, and the nausea.    Objective: Vital signs in last 24 hours: Temp:  [98.7 F (37.1 C)-99.1 F (37.3 C)] 98.7 F (37.1 C) (10/16 0629) Pulse Rate:  [99-114] 114 (10/16 0629) Resp:  [16-18] 18 (10/16 0629) BP: (102-108)/(53-57) 102/53 (10/16 0629) SpO2:  [90 %-96 %] 90 % (10/16 0629)  Intake/Output from previous day: 10/15 0701 - 10/16 0700 In: 1398.3 [I.V.:1218.6; IV Piggyback:179.7] Out: 300 [Emesis/NG output:250; Blood:50] Intake/Output this shift: Total I/O In: 849.2 [I.V.:769.4; IV Piggyback:79.7] Out: -   Physical Exam:  Still a little sleepy, but arousable and able to make sense as well as answer more complicated questions.    Breathing comfortably Abd soft, non distended,  Non tender.     Lab Results:  Results for orders placed or performed during the hospital encounter of 03/20/18 (from the past 48 hour(s))  Glucose, capillary     Status: Abnormal   Collection Time: 03/25/18  8:00 AM  Result Value Ref Range   Glucose-Capillary 111 (H) 70 - 99 mg/dL  Glucose, capillary     Status: Abnormal   Collection Time: 03/25/18  1:02 PM  Result Value Ref Range   Glucose-Capillary 146 (H) 70 - 99 mg/dL  Glucose, capillary     Status: Abnormal   Collection Time: 03/25/18  5:31 PM  Result Value Ref Range   Glucose-Capillary 109 (H) 70 - 99 mg/dL  Glucose, capillary     Status: Abnormal   Collection Time: 03/25/18  9:01 PM  Result Value Ref Range   Glucose-Capillary 145 (H) 70 - 99 mg/dL  Glucose, capillary     Status: Abnormal   Collection Time: 03/25/18 11:56 PM  Result Value Ref Range   Glucose-Capillary 157 (H)  70 - 99 mg/dL  Basic metabolic panel     Status: Abnormal   Collection Time: 03/26/18  6:58 AM  Result Value Ref Range   Sodium 138 135 - 145 mmol/L   Potassium 4.5 3.5 - 5.1 mmol/L   Chloride 98 98 - 111 mmol/L   CO2 29 22 - 32 mmol/L   Glucose, Bld 137 (H) 70 - 99 mg/dL   BUN 11 8 - 23 mg/dL   Creatinine, Ser 0.55 0.44 - 1.00 mg/dL   Calcium 9.4 8.9 - 10.3 mg/dL   GFR calc non Af Amer >60 >60 mL/min   GFR calc Af Amer >60 >60 mL/min    Comment: (NOTE) The eGFR has been calculated using the CKD EPI equation. This calculation has not been validated in all clinical situations. eGFR's persistently <60 mL/min signify possible Chronic Kidney Disease.    Anion gap 11 5 - 15    Comment: Performed at [facility] [address] [place], [place] [address]  Glucose, capillary     Status: Abnormal   Collection Time: 03/26/18  8:22 AM  Result Value Ref Range   Glucose-Capillary 120 (H) 70 - 99 mg/dL  Glucose, capillary     Status: Abnormal   Collection Time: 03/26/18 12:12 PM  Result Value Ref Range   Glucose-Capillary 136 (H) 70 -  99 mg/dL  Glucose, capillary     Status: Abnormal   Collection Time: 03/26/18  5:29 PM  Result Value Ref Range   Glucose-Capillary 136 (H) 70 - 99 mg/dL  Glucose, capillary     Status: Abnormal   Collection Time: 03/26/18  9:30 PM  Result Value Ref Range   Glucose-Capillary 140 (H) 70 - 99 mg/dL    Radiology/Results: No results found.  Anti-infectives: Anti-infectives (From admission, onward)   Start     Dose/Rate Route Frequency Ordered Stop   03/20/18 0745  ceFAZolin (ANCEF) IVPB 2g/100 mL premix     2 g 200 mL/hr over 30 Minutes Intravenous On call to O.R. 03/20/18 0737 03/20/18 0935   03/20/18 0740  ceFAZolin (ANCEF) 2-4 GM/100ML-% IVPB    Note to Pharmacy:  Barbie Haggis   : cabinet override      03/20/18 0740 03/20/18 0935      Assessment/Plan: Problem List: Patient Active Problem List   Diagnosis Date Noted  . Palliative care  by specialist   . Goals of care, counseling/discussion   . Cancer related pain   . Nausea and vomiting   . Anxiety state   . Malnutrition of moderate degree 03/22/2018  . 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 Pancreatic cancer metastasized to liver ([place]) 03/20/2018  . Pancreatic adenocarcinoma ([place]) 02/25/2018  . Headache 12/17/2016  . Confusion 12/17/2016  . Hypothyroidism 12/17/2016  . Acute ischemic stroke ([place]) 12/17/2016  . Ischemic stroke of frontal lobe ([place]) 12/16/2016   Protein calorie malnutrition, moderate. Feeding tube for nutritional support, on hold for now. PT/OT  Bowel rest seems to be improving, and she appears to have thrown up most of the solids that she ate.  Will continue this today to see if her pain requirement continues to go down.  Will stay on IV meds and simethicone.    Palliative care to come back today.  Discussed with patient readdressing tube feeds and diet tomorrow if she continues to improve.  Pt seemed to have an unrealistic idea that palliative care might be like "just going to sleep and not waking up."  Discussed that that is not the way that that works.  That hospice may be an option, but we still focus on symptoms and making people comfortable, not putting them to sleep.    D/c on hold at this point until stable.      LOS: 7 days     [facility]  [phone]  03/27/2018 6:50 AM

## 2018-03-27 NOTE — Progress Notes (Signed)
   03/27/18 1500  Clinical Encounter Type  Visited With Family;Patient and family together;Health care provider  Visit Type Initial;Psychological support;Spiritual support  Referral From Nurse  Stress Factors  Patient Stress Factors Exhausted;Loss of control;Major life changes   Met w/ pt upon recommendation of charge RN.  Pt and family friend Richardson Landry were present in room.  Spoke w/ pt, couldn't understand some words, asked her to repeat and she very clearly and deliberately asked "easiest, quickest way to do this."  I repeated the words back to her and asked what is "this?"  She replied, "end this."  On further question, she said commit suicide.  She confirmed when asked that she wanted to commit suicide, pt stated she was in great pain and didn't want to be in more pain or suffer.  I clarified whether it was physical or emotional pain; pt did not give clear answer but it seemed to be about physical pain.  Also asked pt whether she had made plans about how she wanted to commit suicide and she said she did not have a way planned.  Pt did affirm that she had been thinking about suicide a lot when asked.  Continued asking some questions of pt, she then indicated she wanted to speak alone w/ friend.  Chaplain spoke w/ family friend in hallway, he confirmed also clearly hearing suicide comments and questions from pt, indicated that while the pt had been "praying for Jesus to take her" earlier in the week, apparently had shifted to suicidal thoughts today.  I let Leatrice Jewels, care RN, know about the conversations.  Also conveyed msg from [place], charge RN bk to family friend that Leatrice Jewels RN was making calls about the pt, would be back to talk w/ him shortly as he had indicated he had a question for her, and to ask that he call the RN if he was planning to leave the room and no one besides the pt was present.  Chaplain remains available for support.  Myra Gianotti resident, [phone]

## 2018-03-28 DIAGNOSIS — Z6822 Body mass index (BMI) 22.0-22.9, adult: Secondary | ICD-10-CM

## 2018-03-28 DIAGNOSIS — Z515 Encounter for palliative care: Secondary | ICD-10-CM

## 2018-03-28 DIAGNOSIS — G893 Neoplasm related pain (acute) (chronic): Secondary | ICD-10-CM

## 2018-03-28 LAB — GLUCOSE, CAPILLARY
GLUCOSE-CAPILLARY: 117 mg/dL — AB (ref 70–99)
GLUCOSE-CAPILLARY: 112 mg/dL — AB (ref 70–99)
GLUCOSE-CAPILLARY: 105 mg/dL — AB (ref 70–99)
Glucose-Capillary: 103 mg/dL — ABNORMAL HIGH (ref 70–99)
Glucose-Capillary: 104 mg/dL — ABNORMAL HIGH (ref 70–99)

## 2018-03-28 LAB — BASIC METABOLIC PANEL
Anion gap: 7 (ref 5–15)
BUN: 10 mg/dL (ref 8–23)
CHLORIDE: 99 mmol/L (ref 98–111)
CO2: 26 mmol/L (ref 22–32)
Calcium: 8.4 mg/dL — ABNORMAL LOW (ref 8.9–10.3)
Creatinine, Ser: 0.59 mg/dL (ref 0.44–1.00)
GFR calc non Af Amer: >60 mL/min (ref >60)
Glucose, Bld: 104 mg/dL — ABNORMAL HIGH (ref 70–99)
POTASSIUM: 3.9 mmol/L (ref 3.5–5.1)
Sodium: 132 mmol/L — ABNORMAL LOW (ref 135–145)

## 2018-03-28 LAB — CBC
HEMATOCRIT: 32.1 % — AB (ref 36.0–46.0)
HEMOGLOBIN: 10.6 g/dL — AB (ref 12.0–15.0)
MCH: 31.4 pg (ref 26.0–34.0)
MCHC: 33 g/dL (ref 30.0–36.0)
MCV: 95 fL (ref 80.0–100.0)
Platelets: 264 10*3/uL (ref 150–400)
RBC: 3.38 MIL/uL — AB (ref 3.87–5.11)
RDW: 12.8 % (ref 11.5–15.5)
WBC: 11.5 10*3/uL — AB (ref 4.0–10.5)
nRBC: 0 % (ref 0.0–0.2)

## 2018-03-28 LAB — PREALBUMIN: PREALBUMIN: 7 mg/dL — AB (ref 18–38)

## 2018-03-28 MED ORDER — PROMETHAZINE HCL 25 MG/ML IJ SOLN
12.5000 mg | Freq: Four times a day (QID) | INTRAMUSCULAR | Status: DC
  Filled 2018-03-28: qty 1

## 2018-03-28 MED ORDER — PROMETHAZINE HCL 25 MG/ML IJ SOLN: 12.5000 mg | Freq: Four times a day (QID) | INTRAMUSCULAR | 0 refills | Status: AC

## 2018-03-28 MED ORDER — HYDROMORPHONE HCL 1 MG/ML IJ SOLN: 0.5000 mg | INTRAMUSCULAR | Status: DC | PRN

## 2018-03-28 MED ORDER — OXYCODONE HCL 5 MG/5ML PO SOLN: 5.0000 mg | ORAL | 0 refills | Status: AC | PRN

## 2018-03-28 MED ORDER — ONDANSETRON 4 MG PO TBDP: 4.0000 mg | ORAL_TABLET | Freq: Four times a day (QID) | ORAL | 0 refills | Status: AC | PRN

## 2018-03-28 MED ORDER — HYDROMORPHONE HCL 1 MG/ML IJ SOLN: 0.5000 mg | INTRAMUSCULAR | 0 refills | Status: AC | PRN

## 2018-03-28 MED ORDER — METHOCARBAMOL 1000 MG/10ML IJ SOLN: 500.0000 mg | Freq: Four times a day (QID) | INTRAVENOUS | Status: AC | PRN

## 2018-03-28 MED ORDER — HYDROMORPHONE HCL 1 MG/ML IJ SOLN
1.0000 mg | INTRAMUSCULAR | Status: DC
  Administered 2018-03-28: 1 mg via INTRAVENOUS
  Filled 2018-03-28 (×2): qty 1

## 2018-03-28 MED ORDER — HYDROMORPHONE HCL 1 MG/ML IJ SOLN: 1.0000 mg | INTRAMUSCULAR | 0 refills | Status: AC

## 2018-03-28 MED ORDER — LORAZEPAM 2 MG/ML IJ SOLN: 0.5000 mg | Freq: Three times a day (TID) | INTRAMUSCULAR | 0 refills | Status: AC

## 2018-03-28 NOTE — Social Work (Addendum)
CSW spoke with RN Case Manager Nira Conn who alerted [facility] liaison to pt and family preference for [facility] residential hospice.  Referral has been made, following for bed availability.  12:05pm- Bed available at [facility] will follow up with family and Wilburn Mylar to complete paperwork at 1:30pm. PTAR paperwork prepared and attending MD paged by CSW.    Westley Hummer, MSW, [place] Work [phone]

## 2018-03-28 NOTE — Progress Notes (Signed)
Janice [name]   DOB:12/30/1940   LN#:[id number]    HEME/ONC OVERVIEW: 1. Metastatic pancreatic adenocarcinoma (bx-proven liver mets) - Staging scans negative for mets; adenocarcinoma on intra-op liver biopsy in 03/2018  2. Protein malnutrition - J-tube placed in 03/2018  ASSESSMENT & PLAN:  Abdominal pain, nausea/emesis -Patient reports that her abdominal pain has overall improved over the past 2 days   Metastatic pancreatic adenocarcinoma, goals of care -Patient reports that she just want "palliative care and go to sleep" -I informed the patient that hospice is aimed to keep her comfortable, but not to put her to sleep -The patient is not interested in chemotherapy at this point; given her overall clinical decline, I agree that chemotherapy would likely cause more harms the benefits -Palliative care following; appreciate their assistance  Thank you for caring for our mutual patient.  Please do not hesitate to contact me if there are any questions.  Tish Men, MD  Subjective:  Janice [name] reports that her abdominal pain is a little bit better this morning than the past 2 days.  She is still somewhat drowsy, but appears more alert than 2 days ago.  She denies any other complaint this morning.  ROS: Unable to obtain due to intermittent drowsiness.  Objective:  Vitals:   03/28/18 0656 03/28/18 1558  BP: (!) 118/55 115/62  Pulse: (!) 104 (!) 114  Resp:  18  Temp: 98.4 F (36.9 C) 98.2 F (36.8 C)  SpO2: 95% 95%     Intake/Output Summary (Last 24 hours) at 03/28/2018 1613 Last data filed at 03/28/2018 1500 Gross per 24 hour  Intake 559.75 ml  Output -  Net 559.75 ml    GENERAL: drowsy but arousable, frail-appearing SKIN: skin color, texture, turgor are normal, no rashes or significant lesions EYES: conjunctiva are pink and non-injected, sclera clear OROPHARYNX: no exudate, no erythema; lips, buccal mucosa, and tongue normal  NECK: supple, non-tender LUNGS: clear  to auscultation and percussion with normal breathing effort HEART: regular rhythm, mild tachycardia, and no murmurs and no lower extremity edema ABDOMEN: soft, mild generalized tenderness with palpation, non-distended, decreased bowel sounds PSYCH: mildly drowsy, flat affect NEURO: unable to assess due to mental status    Labs:  Lab Results  Component Value Date   WBC 11.5 (H) 03/28/2018   HGB 10.6 (L) 03/28/2018   HCT 32.1 (L) 03/28/2018   MCV 95.0 03/28/2018   PLT 264 03/28/2018   NEUTROABS 3.2 03/14/2018    Lab Results  Component Value Date   NA 132 (L) 03/28/2018   K 3.9 03/28/2018   CL 99 03/28/2018   CO2 26 03/28/2018

## 2018-03-28 NOTE — Discharge Summary (Signed)
Physician Discharge Summary  Patient ID: Janice [name] MRN: [id number] DOB/AGE: 1941/03/24 77 y.o.  Admit date: 03/20/2018 Discharge date: 03/28/2018  Admission Diagnoses: Stage 1-2 pancreatic cancer  Discharge Diagnoses:  Active Problems:   Intestinal Whipple's disease   Pancreatic cancer metastasized to liver (HCC)   Malnutrition of moderate degree   Palliative care by specialist   Terminal care   Cancer related pain   Nausea and vomiting   Anxiety state stage IV pancreatic cancer  Discharged Condition: poor  Hospital Course:  Pt was admitted following aborted whipple due to metastatic disease found in the liver.  Surgery was altered to gastrojejunostomy and feeding J tube placement.  She had good pain control with epidural and PRN fentanyl.  She started having bowel function.  We were able to get her tube feeds going and she was tolerating those.  NGT was pulled on POD 2.  She was started on clear liquids.  She did well with slow advance to full liquids and up to 30 ml/hr on tube feeds.  She was ambulatory and was having good bowel function.  Unfortunately, when we advanced to soft diet and went to goal on her tube feeds, she had severe nausea and pain.  Because she felt so poorly, we rested the gut and did IV pain med/muscle relaxant, anxiolytic only.  Meds with flushes via J tube were giving her significant bowel cramps.  Pain and nausea did quiet down a bit, but were still pervasive.    She decided that given her poor prognosis, age, and decision NOT to proceed with any chemo or radiation, she would prefer transition to inpatient hospice.  She refused attempts to restart tube feeds.  She has been approved and will transition.    Consults: PT/OT/SW.  Significant Diagnostic Studies: labs: labs prior to d/c NA 132, gluc 104, prealbumin 7, wbcs 11.5, HCT 32.1  Treatments: surgery: tube feeds and IV fluids.    Discharge Exam: Blood pressure (!) 118/55, pulse (!) 104,  temperature 98.4 F (36.9 C), temperature source Oral, resp. rate 17, height 5\' 2"  (1.575 m), weight 55.6 kg, SpO2 95 %. General appearance: alert, cooperative and moderate distress Resp: breathing comfortably GI: soft, mildly distended, mild tenderness. Extremities: extremities normal, atraumatic, no cyanosis or edema  Disposition: Discharge disposition: [place] Not Defined      Discharge Instructions    Diet - low sodium heart healthy   Complete by:  As directed    Increase activity slowly   Complete by:  As directed      Allergies as of 03/28/2018      Reactions   Tape Rash, Other (See Comments)   SKIN DEVELOPS WELTS WHERE TAPE HAS BEEN APPLIED/COBAN WRAP IS TOLERATED, so PLEASE use!!      Medication List    STOP taking these medications   aspirin 81 MG EC tablet   estradiol 0.5 MG tablet Commonly known as:  ESTRACE   polyethylene glycol packet Commonly known as:  MIRALAX / GLYCOLAX     TAKE these medications   acetaminophen 500 MG tablet Commonly known as:  TYLENOL Take 500 mg by mouth every 12 (twelve) hours.   ARMOUR THYROID 30 MG tablet Generic drug:  thyroid Take 30 mg by mouth daily before breakfast.   HYDROmorphone 1 MG/ML injection Commonly known as:  DILAUDID Inject 0.5 mLs (0.5 mg total) into the vein every 2 (two) hours as needed for severe pain.   HYDROmorphone 1 MG/ML injection Commonly  known as:  DILAUDID Inject 1 mL (1 mg total) into the vein every 4 (four) hours.   LORazepam 2 MG/ML injection Commonly known as:  ATIVAN Inject 0.25 mLs (0.5 mg total) into the vein every 8 (eight) hours.   methocarbamol 500 mg in dextrose 5 % 50 mL Inject 500 mg into the vein every 6 (six) hours as needed.   ondansetron 4 MG disintegrating tablet Commonly known as:  ZOFRAN-ODT Take 1 tablet (4 mg total) by mouth every 6 (six) hours as needed for nausea.   oxyCODONE 5 MG/5ML solution Commonly known as:  ROXICODONE Take 5  mLs (5 mg total) by mouth every 4 (four) hours as needed for moderate pain.   promethazine 25 MG/ML injection Commonly known as:  PHENERGAN Inject 0.5 mLs (12.5 mg total) into the vein every 6 (six) hours.        SignedStark Klein 03/28/2018, 12:18 PM

## 2018-03-28 NOTE — Progress Notes (Signed)
Daily Progress Note   Patient Name: Janice [name]       Date: 03/28/2018 DOB: [birth year]  Age: 77 y.o. MRN#: [id number] Attending Physician: [name], MD Primary Care Physician: Curly Rim, MD Admit Date: 03/20/2018  Reason for Consultation/Follow-up: Establishing goals of care and Pain control  Subjective: Patient drowsy from medications but does wake to voice and able to answer questions. She appears comfortable and denies pain or nausea. Only taking bites of food.   GOC: Daughter, Kieth Brightly, at bedside. Discussed plan for comfort and hospice per patient wishes. Educated on hospice philosophy and role of comfort, quality, and dignity at EOL. Educated on medication regimen to ensure comfort and relief from suffering. Educated on EOL expectations. Patient does not have a preference on hospice agency. Daughter requesting [facility] which is closest to family.   Answered questions and concerns. Emotional support provided.   Length of Stay: 8  Current Medications: Scheduled Meds:  . acetaminophen (TYLENOL) oral liquid 160 mg/5 mL  650 mg Per Tube TID  .  HYDROmorphone (DILAUDID) injection  1 mg Intravenous Q4H  . LORazepam  0.5 mg Intravenous Q8H  . promethazine  12.5 mg Intravenous Q6H    Continuous Infusions: . dextrose 5 % and 0.45 % NaCl with KCl 20 mEq/L 50 mL/hr at 03/28/18 0600  . lactated ringers Stopped (03/20/18 1157)  . methocarbamol (ROBAXIN) IV Stopped (03/27/18 2993)    PRN Meds: alum & mag hydroxide-simeth, diphenhydrAMINE **OR** diphenhydrAMINE, HYDROmorphone (DILAUDID) injection, menthol-cetylpyridinium, methocarbamol (ROBAXIN) IV, ondansetron **OR** ondansetron (ZOFRAN) IV, oxyCODONE, phenol, simethicone, sodium chloride  Physical Exam  Constitutional:  She is oriented to person, place, and time. She is cooperative. She appears ill.  HENT:  Head: Normocephalic and atraumatic.  Cardiovascular: Regular rhythm.  Pulmonary/Chest: No accessory muscle usage. No tachypnea. No respiratory distress.  Neurological: She is alert and oriented to person, place, and time.  Drowsy but able to answer all questions and communicate wishes.  Skin: Skin is warm and dry.  Psychiatric: She has a normal mood and affect. Her speech is normal and behavior is normal. Cognition and memory are normal.  Nursing note and vitals reviewed.          Vital Signs: BP (!) 118/55   Pulse (!) 104   Temp 98.4 F (36.9 C) (Oral)  Resp 17   Ht 5\' 2"  (1.575 m)   Wt 55.6 kg   SpO2 95%   BMI 22.42 kg/m  SpO2: SpO2: 95 % O2 Device: O2 Device: Room Air O2 Flow Rate: O2 Flow Rate (L/min): 0 L/min  Intake/output summary:   Intake/Output Summary (Last 24 hours) at 03/28/2018 1104 Last data filed at 03/28/2018 0600 Gross per 24 hour  Intake 483.11 ml  Output 125 ml  Net 358.11 ml   LBM: Last BM Date: 03/26/18 Baseline Weight: Weight: 55.2 kg Most recent weight: Weight: 55.6 kg       Palliative Assessment/Data: PPS 20%   Flowsheet Rows     Most Recent Value  Intake Tab  Referral Department  Surgery  Unit at Time of Referral  Med/Surg Unit  Palliative Care Primary Diagnosis  Cancer  Palliative Care Type  New Palliative care  Reason for referral  Clarify Goals of Care  Date first seen by Palliative Care  03/26/18  Clinical Assessment  Palliative Performance Scale Score  20%  Psychosocial & Spiritual Assessment  Palliative Care Outcomes  Patient/Family meeting held?  Yes  Who was at the meeting?  patient, friend/HCPOA  Palliative Care Outcomes  Clarified goals of care, ACP counseling assistance, Improved pain interventions, Improved non-pain symptom therapy, Provided end of life care assistance, Changed CPR status, Changed to focus on comfort, Provided  psychosocial or spiritual support, Counseled regarding hospice      Patient Active Problem List   Diagnosis Date Noted  . Palliative care by specialist   . Goals of care, counseling/discussion   . Cancer related pain   . Nausea and vomiting   . Anxiety state   . Malnutrition of moderate degree 03/22/2018  . 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 Pancreatic cancer metastasized to liver ([place]) 03/20/2018  . Pancreatic adenocarcinoma ([place]) 02/25/2018  . Headache 12/17/2016  . Confusion 12/17/2016  . Hypothyroidism 12/17/2016  . Acute ischemic stroke ([place]) 12/17/2016  . Ischemic stroke of frontal lobe ([place]) 12/16/2016    Palliative Care Assessment & Plan   Patient Profile: 77 y.o. female  with past medical history of CVA, hypothyroidism, pancreatic cancer, GERD admitted on 03/20/2018 for pancreatic cancer. Patient presented with intermittent lower abdominal pain in August 2019. CT performed revealed pancreatic mass. Further workup with MRI, EUS/biopsy, and chest CT. Biopsies positive for adenocarcinoma. Staging was negative for metastatic disease. Whipple procedure scheduled. 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 Whipple was not performed. Liver biopsy positive for adenocarcinoma. Postop course has been challenging with continued nausea and intractable pain. Palliative medicine consultation for symptom management.   Assessment: Metastatic pancreatic cancer Intractable pain Nausea and vomiting Moderate protein calorie malnutrition  Recommendations/Plan:  Patient wishes to be comfortable. She continues to refuse re-initiation of tube feeds. She is only taking bites and sips. She is ready to die.   Shift to comfort focused care. Agree with Dr. Barry Dienes scheduling dilaudid and phenergan. Recommend continuing IVF until patient is discharged to hospice facility. (Daughter coming from [place] tomorrow evening). Patient/daughter  understand she will not receive IVF at hospice facility.   SW consulted for hospice facility placement.   Code Status: DNR/DNI   Code Status Orders  (From admission, onward)         Start     Ordered   03/26/18 1555  Do not attempt resuscitation (DNR)  Continuous    Question Answer Comment  In the event of cardiac or respiratory ARREST Do not  call a "code blue"   In the event of cardiac or respiratory ARREST Do not perform Intubation, CPR, defibrillation or ACLS   In the event of cardiac or respiratory ARREST Use medication by any route, position, wound care, and other measures to relive pain and suffering. May use oxygen, suction and manual treatment of airway obstruction as needed for comfort.      03/26/18 1554        Code Status History    Date Active Date Inactive Code Status Order ID Comments User Context   03/20/2018 1349 03/26/2018 1554 Full Code [id number]  [name], MD Inpatient   12/17/2016 0131 12/19/2016 2151 Full Code [id number]  Vianne Bulls, MD Inpatient    Advance Directive Documentation     Most Recent Value  Type of Advance Directive  Living will, Healthcare Power of Attorney  Pre-existing out of facility DNR order (yellow form or pink MOST form)  -  "MOST" Form in Place?  -       Prognosis:   Poor prognosis likely <2 weeks if not days with metastatic pancreatic cancer, declining nutritional status, and ongoing pain/nausea  Discharge Planning:  Hospice facility  Care plan was discussed with patient, daughter, Dr. Barry Dienes, RN  Thank you for allowing the Palliative Medicine Team to assist in the care of this patient.   Time In: 1040 Time Out: 1110 Total Time 55min Prolonged Time Billed no      Greater than 50%  of this time was spent counseling and coordinating care related to the above assessment and plan.  Ihor Dow, FNP-C Palliative Medicine Team  Phone: [phone] Fax: [phone]  Please contact Palliative Medicine Team phone at  [phone] for questions and concerns.

## 2018-03-28 NOTE — Progress Notes (Signed)
Pt is discharged to [facility] via [place].  Discharge instructions and report given to Laurene Footman RN.

## 2018-03-28 NOTE — Discharge Instructions (Signed)
Per hospice.  Ok to use tube for meds if patient desires.  Would NOT pull j tube at this time as it is too early and pt would be highly likely to leak intraabdominally.

## 2018-03-28 NOTE — Progress Notes (Signed)
Patient ID: Janice [name], female   DOB: [birth year], 77 y.o.   MRN: [id number] [facility] Progress Note:   Subjective: Pt's pain remains better controlled, but patient has spoken with family and palliative care and desires hospice and comfort measures only.  Spit up a little bit of bile this AM.    Objective: Vital signs in last 24 hours: Temp:  [98 F (36.7 C)-99.5 F (37.5 C)] 98.4 F (36.9 C) (10/17 0656) Pulse Rate:  [87-104] 104 (10/17 0656) Resp:  [15-17] 17 (10/17 0352) BP: (98-118)/(51-68) 118/55 (10/17 0656) SpO2:  [94 %-98 %] 95 % (10/17 0656) Weight:  [55.6 kg] 55.6 kg (10/17 0147)  Intake/Output from previous day: 10/16 0701 - 10/17 0700 In: 483.1 [P.O.:100; I.V.:312.8; IV Piggyback:70.3] Out: 125 [Urine:125] Intake/Output this shift: No intake/output data recorded.  Physical Exam:  Still a little sleepy, but arousable and able to make sense as well as answer more complicated questions.    Breathing comfortably Abd soft, non distended,  Mildly tender.     Lab Results:  Results for orders placed or performed during the hospital encounter of 03/20/18 (from the past 48 hour(s))  Glucose, capillary     Status: Abnormal   Collection Time: 03/26/18 12:12 PM  Result Value Ref Range   Glucose-Capillary 136 (H) 70 - 99 mg/dL  Glucose, capillary     Status: Abnormal   Collection Time: 03/26/18  5:29 PM  Result Value Ref Range   Glucose-Capillary 136 (H) 70 - 99 mg/dL  Glucose, capillary     Status: Abnormal   Collection Time: 03/26/18  9:30 PM  Result Value Ref Range   Glucose-Capillary 140 (H) 70 - 99 mg/dL  Glucose, capillary     Status: Abnormal   Collection Time: 03/27/18  8:01 AM  Result Value Ref Range   Glucose-Capillary 136 (H) 70 - 99 mg/dL   Comment 1 Notify RN   Glucose, capillary     Status: Abnormal   Collection Time: 03/27/18 12:18 PM  Result Value Ref Range   Glucose-Capillary 130 (H) 70 - 99 mg/dL   Comment 1 Notify RN    Glucose, capillary     Status: Abnormal   Collection Time: 03/27/18  5:20 PM  Result Value Ref Range   Glucose-Capillary 116 (H) 70 - 99 mg/dL   Comment 1 Notify RN   Glucose, capillary     Status: None   Collection Time: 03/27/18  9:58 PM  Result Value Ref Range   Glucose-Capillary 95 70 - 99 mg/dL  Glucose, capillary     Status: Abnormal   Collection Time: 03/28/18 12:32 AM  Result Value Ref Range   Glucose-Capillary 103 (H) 70 - 99 mg/dL  CBC     Status: Abnormal   Collection Time: 03/28/18  2:32 AM  Result Value Ref Range   WBC 11.5 (H) 4.0 - 10.5 K/uL   RBC 3.38 (L) 3.87 - 5.11 MIL/uL   Hemoglobin 10.6 (L) 12.0 - 15.0 g/dL   HCT 32.1 (L) 36.0 - 46.0 %   MCV 95.0 80.0 - 100.0 fL   MCH 31.4 26.0 - 34.0 pg   MCHC 33.0 30.0 - 36.0 g/dL   RDW 12.8 11.5 - 15.5 %   Platelets 264 150 - 400 K/uL   nRBC 0.0 0.0 - 0.2 %    Comment: Performed at [facility], [address], [place], [place] [address]  Basic metabolic panel     Status: Abnormal   Collection  Time: 03/28/18  2:32 AM  Result Value Ref Range   Sodium 132 (L) 135 - 145 mmol/L   Potassium 3.9 3.5 - 5.1 mmol/L   Chloride 99 98 - 111 mmol/L   CO2 26 22 - 32 mmol/L   Glucose, Bld 104 (H) 70 - 99 mg/dL   BUN 10 8 - 23 mg/dL   Creatinine, Ser 0.59 0.44 - 1.00 mg/dL   Calcium 8.4 (L) 8.9 - 10.3 mg/dL   GFR calc non Af Amer >60 >60 mL/min   GFR calc Af Amer >60 >60 mL/min    Comment: (NOTE) The eGFR has been calculated using the CKD EPI equation. This calculation has not been validated in all clinical situations. eGFR's persistently <60 mL/min signify possible Chronic Kidney Disease.    Anion gap 7 5 - 15    Comment: Performed at [facility] [address], [place], [place] [address]  Prealbumin     Status: Abnormal   Collection Time: 03/28/18  2:32 AM  Result Value Ref Range   Prealbumin 7.0 (L) 18 - 38 mg/dL    Comment: Performed at [facility] [address] [place], [place] [address]   Glucose, capillary     Status: Abnormal   Collection Time: 03/28/18  3:51 AM  Result Value Ref Range   Glucose-Capillary 104 (H) 70 - 99 mg/dL  Glucose, capillary     Status: Abnormal   Collection Time: 03/28/18  9:03 AM  Result Value Ref Range   Glucose-Capillary 117 (H) 70 - 99 mg/dL    Radiology/Results: No results found.  Anti-infectives: Anti-infectives (From admission, onward)   Start     Dose/Rate Route Frequency Ordered Stop   03/20/18 0745  ceFAZolin (ANCEF) IVPB 2g/100 mL premix     2 g 200 mL/hr over 30 Minutes Intravenous On call to O.R. 03/20/18 0737 03/20/18 0935   03/20/18 0740  ceFAZolin (ANCEF) 2-4 GM/100ML-% IVPB    Note to Pharmacy:  Barbie Haggis   : cabinet override      03/20/18 0740 03/20/18 0935      Assessment/Plan: Problem List: Patient Active Problem List   Diagnosis Date Noted  . Palliative care by specialist   . Goals of care, counseling/discussion   . Cancer related pain   . Nausea and vomiting   . Anxiety state   . Malnutrition of moderate degree 03/22/2018  . 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 Pancreatic adenocarcinoma ([place]) 02/25/2018  . Headache 12/17/2016  . Confusion 12/17/2016  . Hypothyroidism 12/17/2016  . Acute ischemic stroke ([place]) 12/17/2016  . Ischemic stroke of frontal lobe ([place]) 12/16/2016   Pt desires hospice and comfort measures only.  I think we could get her to SNF with tube feeds with symptom management, but patient desires NO life sustaining measures, and is competent to make that decision.  Also, she has a terminal diagnosis of stage IV pancreatic cancer.    This is reasonable.  I have d/c'd pt/ot/scds.  I have also made dilaudid scheduled every 4 hours and phenergan scheduled every 6 hours.  Will keep her on prn zofran and prn dilaudid in addition.  She still has robaxin ordered as well as ativan as needed.  Have also d/c'd tube feeds, feeding  supplements, and tube flushes as these are uncomfortable for patient.  Social work is working on hospice referral and family desires [facility] placement.  She is DNR/DNI.   LOS:  8 days     [facility]  [phone]  03/28/2018 10:13 AM

## 2018-03-28 NOTE — Progress Notes (Signed)
Nutrition Brief Note  Chart reviewed. Pt now transitioning to comfort care; reviewed palliative care note from 03/27/18- pt does not desire to re-start tube feedings and is consuming bites and sips for comfort. Plan to transition to hospice facility No further nutrition interventions warranted at this time.  Please re-consult as needed.   Ramesh Moan A. Jimmye Norman, RD, LDN, CDE Pager: [phone] After hours Pager: [phone]

## 2018-03-28 NOTE — Progress Notes (Addendum)
Patient was noted to be  Sitting on the floor beside the bed and bedside commode, she stated that she lid off the bed and fell on the floor on her bottom when trying to go to the bedside commode, she denies hitting her head, pt is alert and oriented, denies any pain and no visible injury noted. Next of kin informed of fall and text paged on call dr, low bow bed ordered for pt.

## 2018-03-28 NOTE — Social Work (Signed)
Clinical Social Worker facilitated patient discharge including contacting patient family and facility to confirm patient discharge plans.  Clinical information faxed to facility and family agreeable with plan.  CSW arranged ambulance transport via [facility] to [facility], pick up scheduled for 3:00pm.  RN to call [phone] with report  prior to discharge.  Clinical Social Worker will sign off for now as social work intervention is no longer needed. Please consult [place] again if new need arises.  Alexander Mt, [place] Social Worker

## 2018-03-28 NOTE — Progress Notes (Signed)
[facility]  Received request from [facility] for family interest in [facility]. Request confirmed by PMT [place] and CSW [place]. Met briefly with daughter Kieth Brightly who deferred me to [facility]. Spoke with Richardson Landry by phone. Plan is to meet with patient, Richardson Landry and Kieth Brightly at 1:30 to complete paper work for patient to transfer to [facility] today.   Will need DC summary sent to [phone].  RN please call report to [phone].  Thank you,  Erling Conte, LCSW [phone]

## 2018-03-29 LAB — TYPE AND SCREEN
ABO/RH(D): A POS
Antibody Screen: NEGATIVE
UNIT DIVISION: 0
Unit division: 0
Unit division: 0
Unit division: 0

## 2018-03-29 LAB — BPAM RBC
BLOOD PRODUCT EXPIRATION DATE: 201910312359
Blood Product Expiration Date: 201910302359
Blood Product Expiration Date: 201910312359
Blood Product Expiration Date: 201910312359
ISSUE DATE / TIME: 201910032103
ISSUE DATE / TIME: 201910131003
ISSUE DATE / TIME: 201910131238
ISSUE DATE / TIME: 201910131314
UNIT TYPE AND RH: 6200
Unit Type and Rh: 6200
Unit Type and Rh: 6200
Unit Type and Rh: 6200

## 2018-04-12 DEATH — deceased

## 2018-04-22 ENCOUNTER — Encounter: Payer: Medicare PPO | Admitting: Genetic Counselor

## 2018-04-22 ENCOUNTER — Other Ambulatory Visit: Payer: Medicare PPO

## 2018-12-19 IMAGING — CT CT CHEST W/ CM
2 of 3 series · 15 of 36 positions shown, 18 images · IV contrast (iopamidol)
Comparison: 02/16/2018 abdominal MRI. 02/14/2018 abdominal CT.
Chest radiograph 12/16/2016.

CLINICAL DATA: Pancreatic adenocarcinoma.  Evaluate for metastasis.

EXAM:
CT CHEST WITH CONTRAST
TECHNIQUE: Multidetector CT imaging of the chest was performed during
intravenous contrast administration.
CONTRAST:  80mL 9EQHEA-KMM IOPAMIDOL (9EQHEA-KMM) INJECTION 61%

[Series 2: axial st · axial · 0.70mm/px · z∈[-275,-29]mm · 12 of 145 slices shown, 15 images]
[im 11/145  mediastinal]
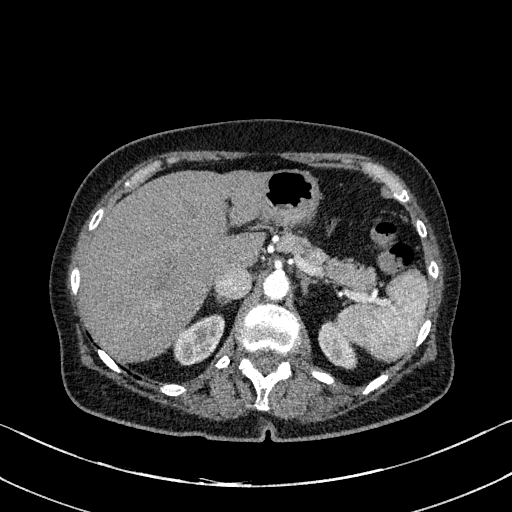
[im 11/145  lung]
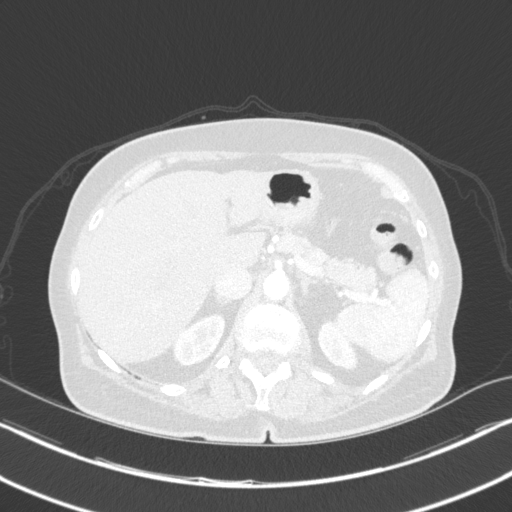
[im 22/145  lung]
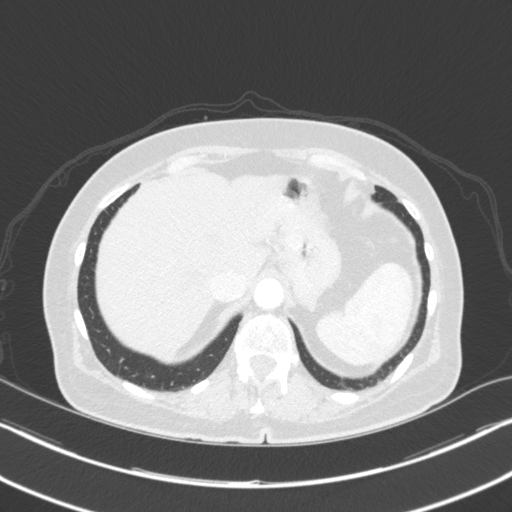
[im 33/145  lung]
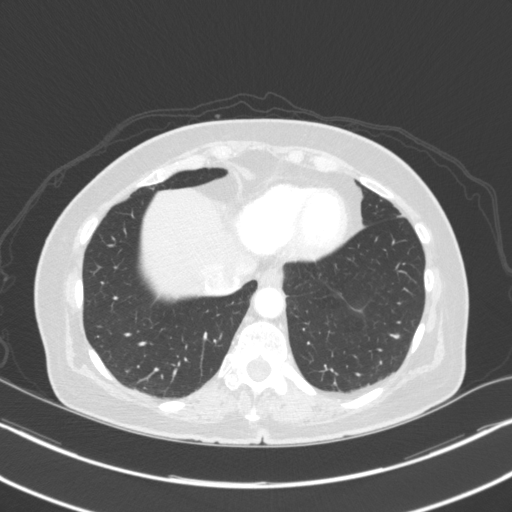
[im 43/145  lung]
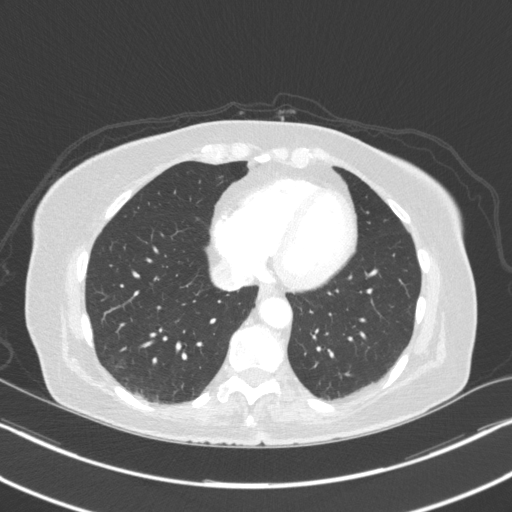
[im 54/145  mediastinal]
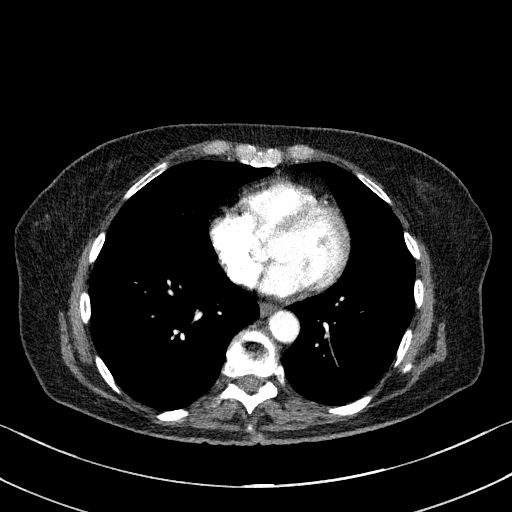
[im 54/145  lung]
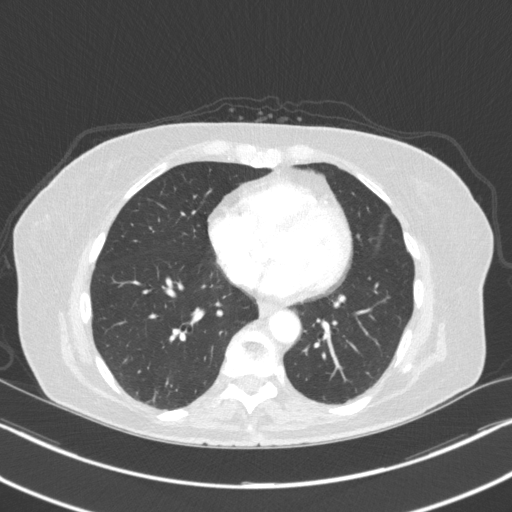
[im 65/145  lung]
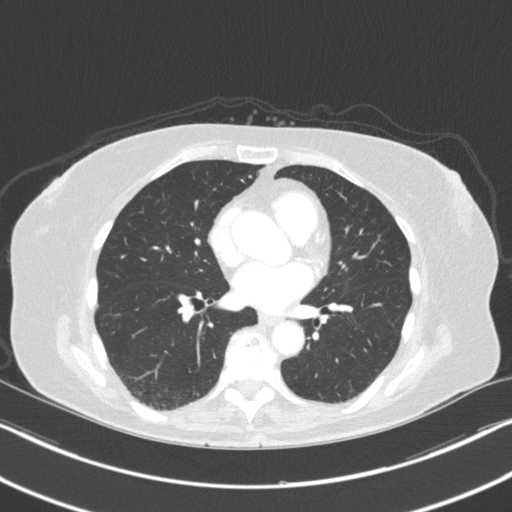
[im 81/145  lung]
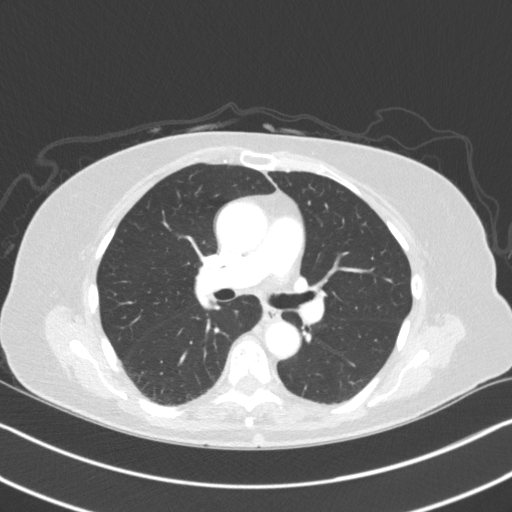
[im 91/145  lung]
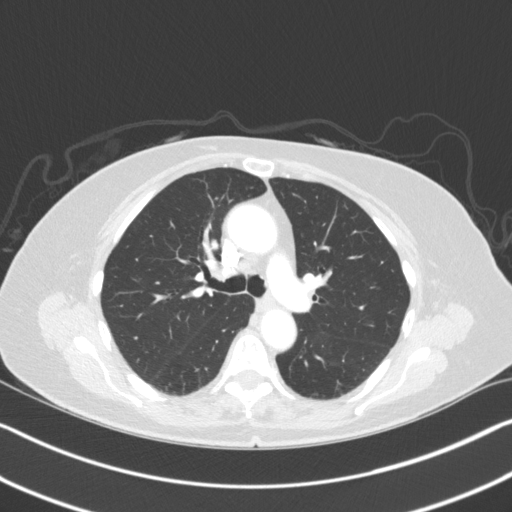
[im 102/145  mediastinal]
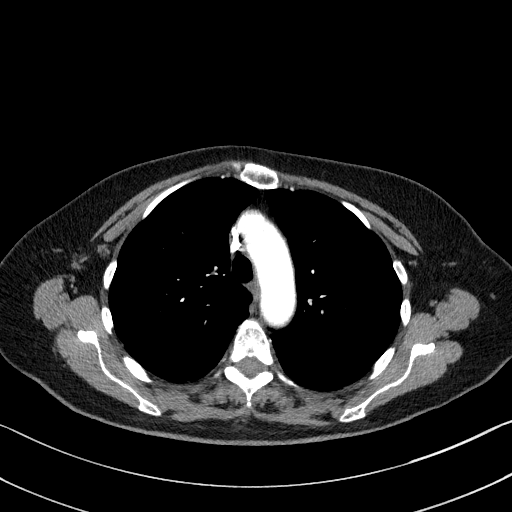
[im 102/145  lung]
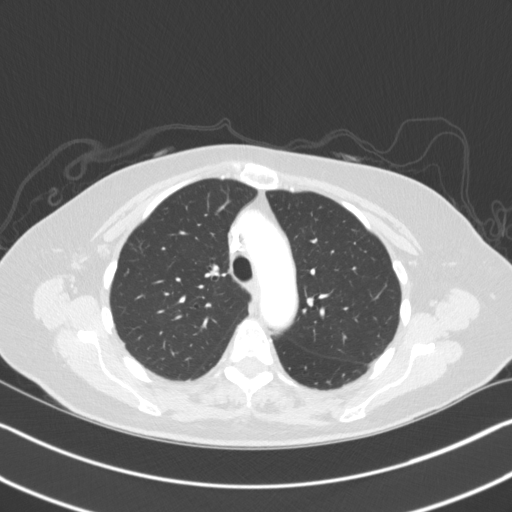
[im 113/145  lung]
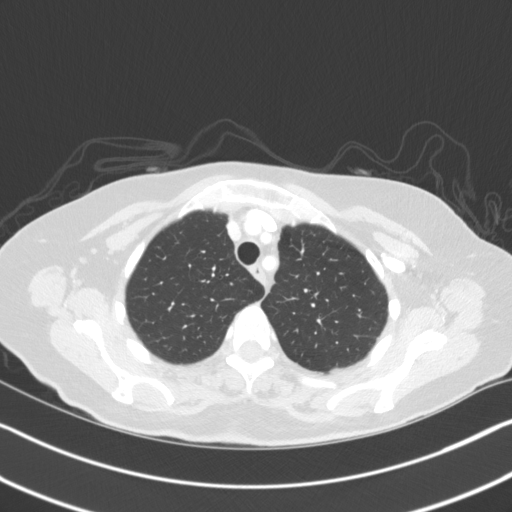
[im 123/145  lung]
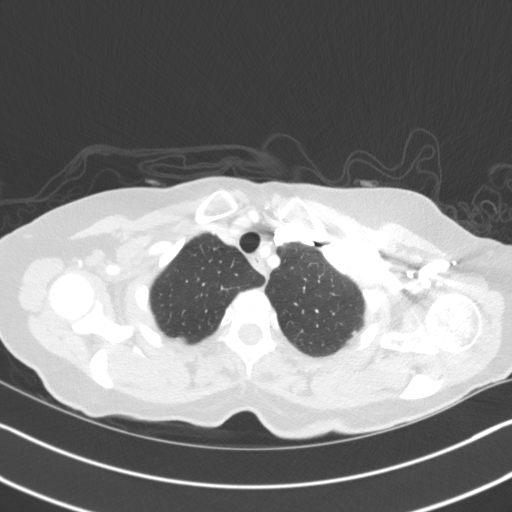
[im 134/145  lung]
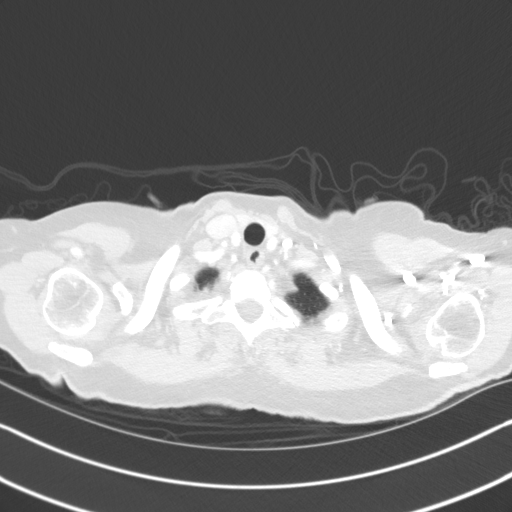

[Series 5: coronal · coronal · 0.60mm/px · 3 of 115 slices shown]
[im 23/115  lung]
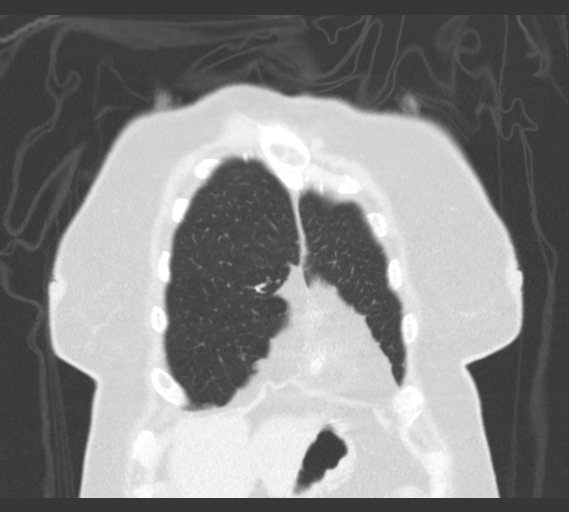
[im 46/115  lung]
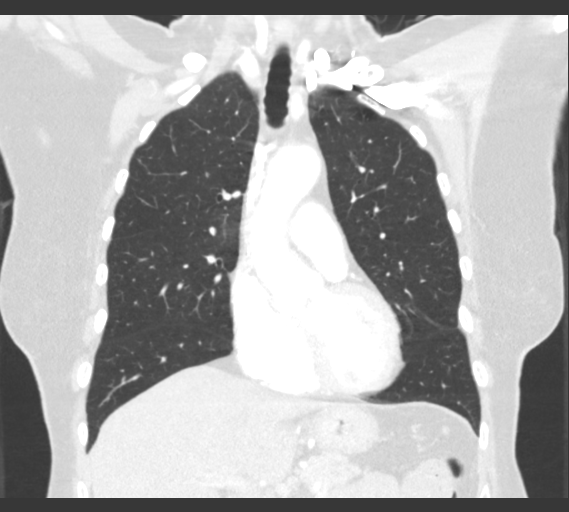
[im 69/115  lung]
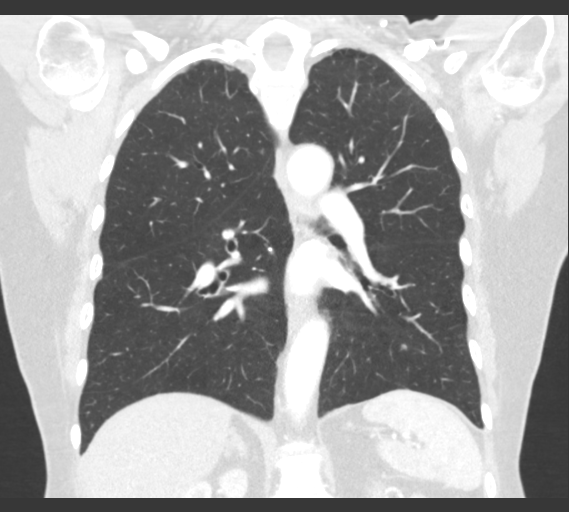

[15 of 36 positions shown; findings below may reference images not displayed]

FINDINGS: Cardiovascular: Aortic and branch vessel atherosclerosis. Tortuous
thoracic aorta. Normal heart size, without pericardial effusion. No
central pulmonary embolism, on this non-dedicated study.

Mediastinum/Nodes: No supraclavicular adenopathy. No mediastinal or
hilar adenopathy.

Lungs/Pleura: No pleural fluid. Basilar and subpleural predominant
pulmonary nodules measure on the order of 5 mm and less, identified
on series 3.

Mild mucoid impaction suspected within the anteromedial right middle
lobe including on image 82/3.

Upper Abdomen: Atrophic lateral segment left liver lobe,
nonspecific. Normal imaged portions of the spleen, stomach, adrenal
glands, kidneys. Pancreatic uncinate process lesion not imaged.
Abdominal aortic atherosclerosis.

Musculoskeletal: Mild thoracic spondylosis. Mild convex right
thoracic spine curvature.
IMPRESSION: 1. Basilar and subpleural predominant pulmonary nodules are
indeterminate. Benign etiology (most likely subpleural lymph nodes)
favored. Recommend attention on follow-up.
2. No thoracic adenopathy.
3.  Aortic Atherosclerosis ([id number]).
